# Patient Record
Sex: Female | Born: 1945
Health system: Southern US, Community
[De-identification: ages and names within clinical notes are randomized; demographics above are authoritative.]

## PROBLEM LIST (undated history)

## (undated) DIAGNOSIS — G629 Polyneuropathy, unspecified: Secondary | ICD-10-CM

## (undated) DIAGNOSIS — C801 Malignant (primary) neoplasm, unspecified: Secondary | ICD-10-CM

## (undated) DIAGNOSIS — G4730 Sleep apnea, unspecified: Secondary | ICD-10-CM

## (undated) DIAGNOSIS — J3089 Other allergic rhinitis: Secondary | ICD-10-CM

## (undated) DIAGNOSIS — K219 Gastro-esophageal reflux disease without esophagitis: Secondary | ICD-10-CM

## (undated) DIAGNOSIS — L603 Nail dystrophy: Secondary | ICD-10-CM

## (undated) DIAGNOSIS — G459 Transient cerebral ischemic attack, unspecified: Secondary | ICD-10-CM

## (undated) DIAGNOSIS — D6800 Von Willebrand disease, unspecified: Secondary | ICD-10-CM

## (undated) DIAGNOSIS — E119 Type 2 diabetes mellitus without complications: Secondary | ICD-10-CM

## (undated) DIAGNOSIS — E7800 Pure hypercholesterolemia, unspecified: Secondary | ICD-10-CM

## (undated) DIAGNOSIS — Z860100 Personal history of colon polyps, unspecified: Secondary | ICD-10-CM

## (undated) DIAGNOSIS — G2581 Restless legs syndrome: Secondary | ICD-10-CM

## (undated) DIAGNOSIS — E039 Hypothyroidism, unspecified: Secondary | ICD-10-CM

## (undated) DIAGNOSIS — R809 Proteinuria, unspecified: Secondary | ICD-10-CM

## (undated) DIAGNOSIS — M1990 Unspecified osteoarthritis, unspecified site: Secondary | ICD-10-CM

## (undated) DIAGNOSIS — E1142 Type 2 diabetes mellitus with diabetic polyneuropathy: Secondary | ICD-10-CM

## (undated) DIAGNOSIS — J449 Chronic obstructive pulmonary disease, unspecified: Secondary | ICD-10-CM

## (undated) DIAGNOSIS — Z8541 Personal history of malignant neoplasm of cervix uteri: Secondary | ICD-10-CM

## (undated) DIAGNOSIS — I10 Essential (primary) hypertension: Secondary | ICD-10-CM

## (undated) HISTORY — DX: Chronic obstructive pulmonary disease, unspecified: J44.9

## (undated) HISTORY — PX: OTHER SURGICAL HISTORY: SHX169

## (undated) HISTORY — DX: Essential (primary) hypertension: I10

## (undated) HISTORY — DX: Gastro-esophageal reflux disease without esophagitis: K21.9

## (undated) HISTORY — PX: APPENDECTOMY: SHX54

## (undated) HISTORY — DX: Personal history of malignant neoplasm of cervix uteri: Z85.41

## (undated) HISTORY — PX: CARDIAC CATHETERIZATION: SHX172

## (undated) HISTORY — PX: MUSCLE BIOPSY: SHX716

## (undated) HISTORY — DX: Unspecified osteoarthritis, unspecified site: M19.90

## (undated) HISTORY — DX: Type 2 diabetes mellitus without complications: E11.9

## (undated) HISTORY — PX: OOPHORECTOMY: SHX86

---

## 1967-01-11 DIAGNOSIS — C801 Malignant (primary) neoplasm, unspecified: Secondary | ICD-10-CM

## 1967-01-11 HISTORY — DX: Malignant (primary) neoplasm, unspecified: C80.1

## 1967-01-11 HISTORY — PX: ABDOMINAL HYSTERECTOMY: SHX81

## 1969-01-10 HISTORY — PX: ABDOMINAL HYSTERECTOMY: SHX81

## 1995-01-11 HISTORY — PX: BLADDER SURGERY: SHX569

## 2001-10-05 LAB — HM PAP SMEAR: HM Pap smear: NORMAL

## 2004-04-01 ENCOUNTER — Ambulatory Visit: Payer: Self-pay | Admitting: Family Medicine

## 2005-04-27 ENCOUNTER — Ambulatory Visit: Payer: Self-pay | Admitting: Family Medicine

## 2005-08-26 ENCOUNTER — Ambulatory Visit: Payer: Self-pay | Admitting: Gastroenterology

## 2005-08-26 DIAGNOSIS — Z860101 Personal history of adenomatous and serrated colon polyps: Secondary | ICD-10-CM | POA: Insufficient documentation

## 2005-08-26 DIAGNOSIS — Z86010 Personal history of colonic polyps: Secondary | ICD-10-CM | POA: Insufficient documentation

## 2006-05-04 ENCOUNTER — Ambulatory Visit: Payer: Self-pay | Admitting: Family Medicine

## 2007-05-08 ENCOUNTER — Ambulatory Visit: Payer: Self-pay | Admitting: Family Medicine

## 2007-12-13 ENCOUNTER — Ambulatory Visit: Payer: Self-pay | Admitting: Specialist

## 2008-01-11 HISTORY — PX: CARPAL TUNNEL RELEASE: SHX101

## 2008-01-11 HISTORY — PX: TENDON RELEASE: SHX230

## 2008-05-13 ENCOUNTER — Ambulatory Visit: Payer: Self-pay | Admitting: Family Medicine

## 2008-06-13 ENCOUNTER — Ambulatory Visit: Payer: Self-pay | Admitting: Family Medicine

## 2008-06-27 ENCOUNTER — Ambulatory Visit: Payer: Self-pay | Admitting: Orthopedic Surgery

## 2008-07-07 ENCOUNTER — Ambulatory Visit: Payer: Self-pay | Admitting: Orthopedic Surgery

## 2008-11-19 ENCOUNTER — Ambulatory Visit: Payer: Self-pay | Admitting: Podiatry

## 2008-11-20 ENCOUNTER — Ambulatory Visit: Payer: Self-pay | Admitting: Podiatry

## 2008-11-28 ENCOUNTER — Ambulatory Visit: Payer: Self-pay | Admitting: Podiatry

## 2008-12-17 ENCOUNTER — Ambulatory Visit: Payer: Self-pay | Admitting: Podiatry

## 2009-02-24 ENCOUNTER — Ambulatory Visit: Payer: Self-pay | Admitting: Podiatry

## 2009-10-21 ENCOUNTER — Ambulatory Visit: Payer: Self-pay | Admitting: Unknown Physician Specialty

## 2009-11-09 LAB — HM MAMMOGRAPHY: HM MAMMO: NORMAL

## 2010-09-24 ENCOUNTER — Emergency Department: Payer: Self-pay | Admitting: Internal Medicine

## 2010-10-08 LAB — HM COLONOSCOPY

## 2010-11-09 ENCOUNTER — Ambulatory Visit: Payer: Self-pay | Admitting: Family Medicine

## 2011-05-30 ENCOUNTER — Ambulatory Visit: Payer: Self-pay | Admitting: Gastroenterology

## 2011-08-16 ENCOUNTER — Ambulatory Visit: Payer: Self-pay | Admitting: Gastroenterology

## 2011-08-18 LAB — PATHOLOGY REPORT

## 2011-11-10 ENCOUNTER — Ambulatory Visit: Payer: Self-pay | Admitting: Family Medicine

## 2012-12-05 ENCOUNTER — Ambulatory Visit: Payer: Self-pay | Admitting: Family Medicine

## 2013-10-31 DIAGNOSIS — G4733 Obstructive sleep apnea (adult) (pediatric): Secondary | ICD-10-CM | POA: Insufficient documentation

## 2013-11-01 ENCOUNTER — Ambulatory Visit: Payer: Self-pay | Admitting: Family Medicine

## 2013-12-09 ENCOUNTER — Ambulatory Visit: Payer: Self-pay | Admitting: Family Medicine

## 2014-07-21 ENCOUNTER — Other Ambulatory Visit: Payer: Self-pay | Admitting: Family Medicine

## 2014-07-25 DIAGNOSIS — E1149 Type 2 diabetes mellitus with other diabetic neurological complication: Secondary | ICD-10-CM | POA: Insufficient documentation

## 2014-07-25 DIAGNOSIS — L603 Nail dystrophy: Secondary | ICD-10-CM | POA: Insufficient documentation

## 2014-07-25 DIAGNOSIS — D6800 Von Willebrand disease, unspecified: Secondary | ICD-10-CM | POA: Insufficient documentation

## 2014-07-25 DIAGNOSIS — E039 Hypothyroidism, unspecified: Secondary | ICD-10-CM | POA: Insufficient documentation

## 2014-07-25 DIAGNOSIS — G2581 Restless legs syndrome: Secondary | ICD-10-CM | POA: Insufficient documentation

## 2014-07-25 DIAGNOSIS — K219 Gastro-esophageal reflux disease without esophagitis: Secondary | ICD-10-CM | POA: Insufficient documentation

## 2014-07-25 DIAGNOSIS — R252 Cramp and spasm: Secondary | ICD-10-CM | POA: Insufficient documentation

## 2014-07-25 DIAGNOSIS — E7800 Pure hypercholesterolemia, unspecified: Secondary | ICD-10-CM | POA: Insufficient documentation

## 2014-07-25 DIAGNOSIS — K5730 Diverticulosis of large intestine without perforation or abscess without bleeding: Secondary | ICD-10-CM | POA: Insufficient documentation

## 2014-07-25 DIAGNOSIS — Z8541 Personal history of malignant neoplasm of cervix uteri: Secondary | ICD-10-CM | POA: Insufficient documentation

## 2014-07-25 DIAGNOSIS — R809 Proteinuria, unspecified: Secondary | ICD-10-CM | POA: Insufficient documentation

## 2014-07-25 DIAGNOSIS — Z9181 History of falling: Secondary | ICD-10-CM | POA: Insufficient documentation

## 2014-07-25 DIAGNOSIS — I10 Essential (primary) hypertension: Secondary | ICD-10-CM | POA: Insufficient documentation

## 2014-07-25 DIAGNOSIS — R609 Edema, unspecified: Secondary | ICD-10-CM | POA: Insufficient documentation

## 2014-07-25 DIAGNOSIS — J449 Chronic obstructive pulmonary disease, unspecified: Secondary | ICD-10-CM | POA: Insufficient documentation

## 2014-07-25 DIAGNOSIS — IMO0002 Reserved for concepts with insufficient information to code with codable children: Secondary | ICD-10-CM | POA: Insufficient documentation

## 2014-07-25 DIAGNOSIS — G459 Transient cerebral ischemic attack, unspecified: Secondary | ICD-10-CM | POA: Insufficient documentation

## 2014-07-29 ENCOUNTER — Ambulatory Visit
Admission: RE | Admit: 2014-07-29 | Discharge: 2014-07-29 | Disposition: A | Discharge status: Home / Self Care | Payer: PPO | Source: Ambulatory Visit | Attending: Family Medicine | Admitting: Family Medicine

## 2014-07-29 ENCOUNTER — Encounter: Payer: Self-pay | Admitting: Family Medicine

## 2014-07-29 ENCOUNTER — Ambulatory Visit (INDEPENDENT_AMBULATORY_CARE_PROVIDER_SITE_OTHER): Payer: PPO | Admitting: Family Medicine

## 2014-07-29 VITALS — BP 110/64 | HR 76 | Temp 98.4°F | Resp 16 | Ht 65.0 in | Wt 232.0 lb

## 2014-07-29 DIAGNOSIS — M79605 Pain in left leg: Secondary | ICD-10-CM | POA: Diagnosis not present

## 2014-07-29 DIAGNOSIS — J309 Allergic rhinitis, unspecified: Secondary | ICD-10-CM | POA: Diagnosis not present

## 2014-07-29 DIAGNOSIS — M159 Polyosteoarthritis, unspecified: Secondary | ICD-10-CM

## 2014-07-29 DIAGNOSIS — M150 Primary generalized (osteo)arthritis: Secondary | ICD-10-CM

## 2014-07-29 DIAGNOSIS — I10 Essential (primary) hypertension: Secondary | ICD-10-CM | POA: Diagnosis not present

## 2014-07-29 DIAGNOSIS — E119 Type 2 diabetes mellitus without complications: Secondary | ICD-10-CM | POA: Diagnosis not present

## 2014-07-29 LAB — POCT GLYCOSYLATED HEMOGLOBIN (HGB A1C)
Est. average glucose Bld gHb Est-mCnc: 146
HEMOGLOBIN A1C: 6.7

## 2014-07-29 MED ORDER — MONTELUKAST SODIUM 10 MG PO TABS: ORAL_TABLET | ORAL | Status: DC

## 2014-07-29 NOTE — Progress Notes (Signed)
Patient: Samantha Henry Female    DOB: 1945/06/05   69 y.o.   MRN: [id number] Visit Date: 07/29/2014  Today's Provider: Lelon Huh, MD   Chief Complaint  Patient presents with  . Follow-up  . Hypertension  . Diabetes  . Hypothyroidism   Subjective:    Hypertension This is a chronic problem. The current episode started more than 1 year ago. The problem is controlled. Associated symptoms include malaise/fatigue. Pertinent negatives include no chest pain, headaches, palpitations or shortness of breath. Risk factors for coronary artery disease include diabetes mellitus.  Diabetes She presents for her follow-up diabetic visit. She has type 2 diabetes mellitus. Pertinent negatives for hypoglycemia include no dizziness or headaches. Associated symptoms include fatigue. Pertinent negatives for diabetes include no chest pain. Risk factors for coronary artery disease include hypertension and diabetes mellitus.   Follow-up for Adult hypothyroidism from 03/24/2014; no changes were made.      Diabetes Mellitus Type II, Follow-up:   No results found for: HGBA1C Last seen for diabetes 4 months ago.  Management changes included none. She reports good compliance with treatment. She is not having side effects. NONE  Home blood sugar records: fasting range: 112  Current exercise: no regular exercise  ------------------------------------------------------------------------   Hypertension, follow-up:  BP Readings from Last 3 Encounters:  07/29/14 110/64  03/24/14 130/72    She was last seen for hypertension 4 months ago.  BP at that visit was 138/82. Management changes since that visit include restarted lisinopril .She reports good compliance with treatment. She is not having side effects. none  She is not exercising. She is adherent to low salt diet.   Outside blood pressures are 110/65. She is experiencing none.  Patient denies none.     -------------------------------------------------------------------- Leg Pain States that her left leg still hurts, ever since she had a fall in November. She states she went to hospital and had Xray the day of the fall and there was no fracture at that time. She is able to bear weight on leg, but it is painful.   Allergies  Allergen Reactions  . Aspirin   . Tetracycline    Previous Medications   ALBUTEROL (VENTOLIN HFA) 108 (90 BASE) MCG/ACT INHALER    Inhale 1-2 Inhalers into the lungs as needed.   ASPIRIN 81 MG TABLET    Take 1 tablet by mouth daily.   BUDESONIDE-FORMOTEROL (SYMBICORT) 160-4.5 MCG/ACT INHALER    Inhale 2 puffs into the lungs 2 (two) times daily.   CALCIUM-MAGNESIUM-VITAMIN D (CALCIUM 500 PO)    Take 1 tablet by mouth 2 (two) times daily.   CRESTOR 20 MG TABLET    TAKE ONE TABLET BY MOUTH ONCE DAILY   FUROSEMIDE (LASIX) 20 MG TABLET    Take 1 tablet by mouth as needed.   IBUPROFEN (ADVIL,MOTRIN) 200 MG TABLET    Take 1-2 tablets by mouth as needed.   LEVOTHYROXINE (SYNTHROID, LEVOTHROID) 150 MCG TABLET    Take 1 tablet by mouth daily.   LISINOPRIL (PRINIVIL,ZESTRIL) 10 MG TABLET    Take 1 tablet by mouth daily.   METFORMIN (GLUCOPHAGE) 500 MG TABLET    Take 2 tablets by mouth 2 (two) times daily.   MULTIPLE VITAMIN PO    Take 1 tablet by mouth daily.   OMEGA-3 FATTY ACIDS (FISH OIL BURP-LESS) 1000 MG CAPS    Take 2 tablets by mouth daily.   RABEPRAZOLE (ACIPHEX) 20 MG TABLET    Take 1  tablet by mouth daily.   TIOTROPIUM (SPIRIVA HANDIHALER) 18 MCG INHALATION CAPSULE    Place 1 Inhaler into inhaler and inhale daily.    Review of Systems  Constitutional: Positive for malaise/fatigue and fatigue. Negative for fever and chills.  Respiratory: Negative for cough, chest tightness and shortness of breath.   Cardiovascular: Positive for leg swelling. Negative for chest pain and palpitations.  Neurological: Negative for dizziness and headaches.    History  Substance Use  Topics  . Smoking status: Former Smoker -- 1.50 packs/day    Types: Cigarettes    Quit date: 01/11/2007  . Smokeless tobacco: Never Used  . Alcohol Use: No   Objective:   BP 110/64 mmHg  Pulse 76  Temp(Src) 98.4 F (36.9 C) (Oral)  Resp 16  Ht '5\' 5"'$  (1.651 m)  Wt 232 lb (105.235 kg)  BMI 38.61 kg/m2  SpO2 94%  Physical Exam   General Appearance:    Alert, cooperative, no distress  Eyes:    PERRL, conjunctiva/corneas clear, EOM's intact       Lungs:     Clear to auscultation bilaterally, respirations unlabored  Heart:    Regular rate and rhythm  Neurologic:   Awake, alert, oriented x 3. No apparent focal neurological           defect.    Diabetic Foot Exam - Simple   Simple Foot Form  Visual Inspection  No deformities, no ulcerations, no other skin breakdown bilaterally:  Yes  Sensation Testing  Intact to touch and monofilament testing bilaterally:  Yes  Pulse Check  Posterior Tibialis and Dorsalis pulse intact bilaterally:  Yes  Comments         Results for orders placed or performed in visit on 07/29/14  POCT glycosylated hemoglobin (Hb A1C)  Result Value Ref Range   Hemoglobin A1C 6.7    Est. average glucose Bld gHb Est-mCnc 146        Assessment & Plan:     1. Left leg pain  - DG Tibia/Fibula Left; Future - DG Ankle Complete Left; Future  2. Essential hypertension well controlled Continue current medications.    3. Type 2 diabetes mellitus without complication well controlled Continue current medications.   - POCT glycosylated hemoglobin (Hb A1C)  4. Allergic rhinitis, unspecified allergic rhinitis type States she has tried [facility] and not controlling congestion, drainage, and coug.  - montelukast (SINGULAIR) 10 MG tablet; One every day for allergies.  Dispense: 30 tablet; Refill: 3        Lelon Huh, MD  [facility]

## 2014-07-30 ENCOUNTER — Encounter: Payer: Self-pay | Admitting: *Deleted

## 2014-08-02 ENCOUNTER — Encounter: Payer: Self-pay | Admitting: Family Medicine

## 2014-08-02 DIAGNOSIS — M1990 Unspecified osteoarthritis, unspecified site: Secondary | ICD-10-CM | POA: Insufficient documentation

## 2014-08-18 ENCOUNTER — Telehealth: Payer: Self-pay | Admitting: Family Medicine

## 2014-08-18 DIAGNOSIS — M79606 Pain in leg, unspecified: Secondary | ICD-10-CM

## 2014-08-18 NOTE — Telephone Encounter (Signed)
Still having sinus problems.  Also still having foot pain.  Please call her back.  [phone].  Thank sTeri

## 2014-08-18 NOTE — Telephone Encounter (Signed)
Patient stated that she still has tickling in her throat which makes her cough constantly. Patient also said that her foot is no better. Patient has been useing wrap that was recommended, but it does not help. Patient stated that her foot is still discolored and very painful. Patient requested referral to orthopedics.

## 2014-08-18 NOTE — Telephone Encounter (Signed)
Sometimes lisinopril can cause tickle in throat and a cough. She can try stopping the lisinopril for a week to see if this helps, and let me know if it does so we can change to different BP medication  Needs referral to orthopedics for foot and ankle pain since it is still not better. Have sent order to Sarah to schedule. Thanks.

## 2014-08-18 NOTE — Telephone Encounter (Signed)
Patient notified

## 2014-09-23 ENCOUNTER — Telehealth: Payer: Self-pay | Admitting: Family Medicine

## 2014-09-23 DIAGNOSIS — I10 Essential (primary) hypertension: Secondary | ICD-10-CM

## 2014-09-23 NOTE — Telephone Encounter (Signed)
Pt has called back.  Thanks [employer]

## 2014-09-23 NOTE — Telephone Encounter (Signed)
Pt has question of whether she can start back on her blood pressure pills.  She said she was having some adverse reactions and you told her to go off the blood pressure pill but haven't prescribed anything else in its place.     Her call back is [phone]  Thanks [employer]

## 2014-09-24 MED ORDER — LOSARTAN POTASSIUM 50 MG PO TABS: 50.0000 mg | ORAL_TABLET | Freq: Every day | ORAL | Status: DC

## 2014-09-24 NOTE — Telephone Encounter (Signed)
Advised pt as directed below. Pt stated that her tickling on her throat had stopped when she stopped taking the Lisinopril and that she was under the impression that her B/P medication was going to be changed. She also stated that if something is to be prescribed to send it to the [facility].  Thanks,

## 2014-09-24 NOTE — Telephone Encounter (Signed)
Advised pt as directed below, pt stated that she will be leaving on Monday(09/29/2014) evening to [place] for a couple of months, "to take care of her son, he had suffered a car accident and he is paralyzed " she said that she can come in this Monday 09/29/2014 morning or in two months when she comes back.  Please advise.  Thanks,

## 2014-09-24 NOTE — Telephone Encounter (Signed)
She can come in when she returns from [place]

## 2014-09-24 NOTE — Telephone Encounter (Signed)
We had her stop lisinopril in August due to constant tickling in her throat. Need to know if this is better, if is gone then will change to different blood pressure medication.

## 2014-09-24 NOTE — Telephone Encounter (Signed)
Advised pt as directed below. Pt verbalized fully understanding and said that if she comes back home sooner than the 2 months she will call to make a f/u appointment.  Thanks,

## 2014-09-24 NOTE — Telephone Encounter (Signed)
OK. Need to start losartan 3monce a day. Have sent rx to [facility]. Need to schedule follow up in about 4 weeks to check on BP and blood sugar.

## 2014-10-15 ENCOUNTER — Other Ambulatory Visit: Payer: Self-pay | Admitting: Family Medicine

## 2014-10-15 DIAGNOSIS — Z1231 Encounter for screening mammogram for malignant neoplasm of breast: Secondary | ICD-10-CM

## 2014-12-11 ENCOUNTER — Ambulatory Visit: Payer: PPO

## 2014-12-19 ENCOUNTER — Other Ambulatory Visit: Payer: Self-pay | Admitting: Family Medicine

## 2014-12-19 LAB — HM DIABETES EYE EXAM

## 2014-12-22 ENCOUNTER — Other Ambulatory Visit: Payer: Self-pay | Admitting: *Deleted

## 2014-12-22 MED ORDER — LEVOTHYROXINE SODIUM 150 MCG PO TABS: 150.0000 ug | ORAL_TABLET | Freq: Every day | ORAL | Status: DC

## 2014-12-25 ENCOUNTER — Ambulatory Visit
Admission: RE | Admit: 2014-12-25 | Discharge: 2014-12-25 | Disposition: A | Discharge status: Home / Self Care | Payer: PPO | Source: Ambulatory Visit | Attending: Family Medicine | Admitting: Family Medicine

## 2014-12-25 DIAGNOSIS — Z1231 Encounter for screening mammogram for malignant neoplasm of breast: Secondary | ICD-10-CM | POA: Diagnosis not present

## 2014-12-25 HISTORY — DX: Malignant (primary) neoplasm, unspecified: C80.1

## 2014-12-26 ENCOUNTER — Encounter: Payer: Self-pay | Admitting: Family Medicine

## 2014-12-26 ENCOUNTER — Ambulatory Visit (INDEPENDENT_AMBULATORY_CARE_PROVIDER_SITE_OTHER): Payer: PPO | Admitting: Family Medicine

## 2014-12-26 VITALS — BP 120/60 | HR 80 | Temp 97.7°F | Resp 16 | Wt 228.0 lb

## 2014-12-26 DIAGNOSIS — G629 Polyneuropathy, unspecified: Secondary | ICD-10-CM | POA: Diagnosis not present

## 2014-12-26 DIAGNOSIS — E119 Type 2 diabetes mellitus without complications: Secondary | ICD-10-CM | POA: Diagnosis not present

## 2014-12-26 DIAGNOSIS — R059 Cough, unspecified: Secondary | ICD-10-CM

## 2014-12-26 DIAGNOSIS — R05 Cough: Secondary | ICD-10-CM

## 2014-12-26 DIAGNOSIS — J449 Chronic obstructive pulmonary disease, unspecified: Secondary | ICD-10-CM

## 2014-12-26 LAB — POCT UA - MICROALBUMIN: Microalbumin Ur, POC: 20 mg/L

## 2014-12-26 LAB — POCT GLYCOSYLATED HEMOGLOBIN (HGB A1C)
Est. average glucose Bld gHb Est-mCnc: 151
Hemoglobin A1C: 6.9

## 2014-12-26 MED ORDER — AZITHROMYCIN 250 MG PO TABS: ORAL_TABLET | ORAL | Status: AC

## 2014-12-26 NOTE — Progress Notes (Signed)
Patient: Samantha Henry Female    DOB: 24-May-1945   70 y.o.   MRN: [id number] Visit Date: 12/26/2014  Today's Provider: Lelon Huh, MD   Chief Complaint  Patient presents with  . Diabetes    follow up  . Hypertension    follow up   Subjective:    HPI  Diabetes Mellitus Type II, Follow-up:   Lab Results  Component Value Date   HGBA1C 6.7 07/29/2014    Last seen for diabetes 5 months ago.  Management since then includes no changes. She reports good compliance with treatment. She is not having side effects.  Current symptoms include none and have been stable. Home blood sugar records: fasting range: 120-160  Episodes of hypoglycemia? no   Current Insulin Regimen: none Most Recent Eye Exam: 12/2014 Weight trend: stable Prior visit with dietician: no Current diet: in general, an "unhealthy" diet Current exercise: none  Pertinent Labs: No results found for: CHOL, TRIG, HDL, LDLCALC, CREATININE  Wt Readings from Last 3 Encounters:  07/29/14 232 lb (105.235 kg)  03/24/14 225 lb (102.059 kg)    ------------------------------------------------------------------------   Leg Pain Has long history of persistent lower leg pain at rest. She states that Dr. Raul Del advised her to request a referral to Dr. Manuella Ghazi for evaluation of neruopathy.    Hypertension, follow-up:  BP Readings from Last 3 Encounters:  07/29/14 110/64  03/24/14 130/72    She was last seen for hypertension 5 months ago.  BP at that visit was 110/64. Management since that visit includes discontinuing Lisinopril due to it causing a tickle in patient throat and starting Losartan '5mg'$  daily. Since changing medications patient reports the tickle in her throat has resolved.  She reports good compliance with treatment. She is not having side effects.  She is not exercising. She is adherent to low salt diet.   Outside blood pressures are 119-417 systolic over 40-81 diastolic. She is  experiencing fatigue and lower extremity edema.  Patient denies fatigue and lower extremity edema.   Cardiovascular risk factors include diabetes mellitus, hypertension, obesity (BMI >= 30 kg/m2) and sedentary lifestyle.  Use of agents associated with hypertension: NSAIDS.     Weight trend: stable Wt Readings from Last 3 Encounters:  07/29/14 232 lb (105.235 kg)  03/24/14 225 lb (102.059 kg)    Current diet: in general, an "unhealthy" diet  ------------------------------------------------------------------------  Cough States has had a persistent and worsening cough over the last week. She is using her inhalers regularly including Ventolin which she is using every day. She is coughing up clear and yellow mucous. Slightly short of breath. No fever or chills. She started taking Claritin due to suspected allergies.      Allergies  Allergen Reactions  . Aspirin   . Tetracycline    Previous Medications   ALBUTEROL (VENTOLIN HFA) 108 (90 BASE) MCG/ACT INHALER    Inhale 1-2 Inhalers into the lungs as needed.   ASPIRIN 81 MG TABLET    Take 1 tablet by mouth daily.   BUDESONIDE-FORMOTEROL (SYMBICORT) 160-4.5 MCG/ACT INHALER    Inhale 2 puffs into the lungs 2 (two) times daily.   CALCIUM-MAGNESIUM-VITAMIN D (CALCIUM 500 PO)    Take 1 tablet by mouth 2 (two) times daily.   CRESTOR 20 MG TABLET    TAKE ONE TABLET BY MOUTH ONCE DAILY   FUROSEMIDE (LASIX) 20 MG TABLET    Take 1 tablet by mouth as needed.   IBUPROFEN (ADVIL,MOTRIN) 200 MG  TABLET    Take 1-2 tablets by mouth as needed.   LEVOTHYROXINE (SYNTHROID, LEVOTHROID) 150 MCG TABLET    Take 1 tablet (150 mcg total) by mouth daily.   LOSARTAN (COZAAR) 50 MG TABLET    TAKE ONE TABLET BY MOUTH ONCE DAILY   METFORMIN (GLUCOPHAGE) 500 MG TABLET    Take 2 tablets by mouth 2 (two) times daily.   MONTELUKAST (SINGULAIR) 10 MG TABLET    One every day for allergies.   MULTIPLE VITAMIN PO    Take 1 tablet by mouth daily.   OMEGA-3 FATTY ACIDS  (FISH OIL BURP-LESS) 1000 MG CAPS    Take 2 tablets by mouth daily.   RABEPRAZOLE (ACIPHEX) 20 MG TABLET    Take 1 tablet by mouth daily.   TIOTROPIUM (SPIRIVA HANDIHALER) 18 MCG INHALATION CAPSULE    Place 1 Inhaler into inhaler and inhale daily.    Review of Systems  Constitutional: Positive for fatigue. Negative for fever, chills and appetite change.  HENT: Positive for congestion and sore throat.   Respiratory: Negative for chest tightness and shortness of breath.   Cardiovascular: Positive for leg swelling. Negative for chest pain and palpitations.  Gastrointestinal: Negative for nausea, vomiting and abdominal pain.  Neurological: Negative for dizziness and weakness.    Social History  Substance Use Topics  . Smoking status: Former Smoker -- 1.50 packs/day    Types: Cigarettes    Quit date: 01/11/2007  . Smokeless tobacco: Never Used  . Alcohol Use: No   Objective:   BP 120/60 mmHg  Pulse 80  Temp(Src) 97.7 F (36.5 C) (Oral)  Resp 16  Wt 228 lb (103.42 kg)  SpO2 94%  Physical Exam  General Appearance:    Alert, cooperative, no distress  HENT:   bilateral TM normal without fluid or infection, neck without nodes, pharynx erythematous without exudate, sinuses nontender and nasal mucosa pale and congested  Eyes:    PERRL, conjunctiva/corneas clear, EOM's intact       Lungs:     Occasional expiratory wheeze, no rales respirations unlabored  Heart:    Regular rate and rhythm  Neurologic:   Awake, alert, oriented x 3. No apparent focal neurological           defect.       Results for orders placed or performed in visit on 12/26/14  POCT HgB A1C  Result Value Ref Range   Hemoglobin A1C 6.9    Est. average glucose Bld gHb Est-mCnc 151   POCT UA - Microalbumin  Result Value Ref Range   Microalbumin Ur, POC 20 mg/L   Creatinine, POC n/a mg/dL   Albumin/Creatinine Ratio, Urine, POC n/a        Assessment & Plan:     1. Controlled type 2 diabetes mellitus without  complication, without long-term current use of insulin (HCC) Continue current medications.   - POCT HgB A1C - POCT UA - Microalbumin  2. Cough In setting of COPD. She has still not had flu shot and does not want to get it until she is better. Advised to return as soon as cough starts to improve for flu vaccine - azithromycin (ZITHROMAX) 250 MG tablet; 2 by mouth today, then 1 daily for 4 days  Dispense: 6 tablet; Refill: 0  3. Neuropathy ([place]) Possible cause of chronic bilateral leg pain, refer to neurology per her request.  - Ambulatory referral to Neurology  4. Chronic obstructive pulmonary disease, unspecified COPD type ([place]) Continue current inhalers.  Lelon Huh, MD  [facility]

## 2014-12-29 ENCOUNTER — Other Ambulatory Visit: Payer: Self-pay | Admitting: Family Medicine

## 2014-12-29 DIAGNOSIS — R921 Mammographic calcification found on diagnostic imaging of breast: Secondary | ICD-10-CM

## 2014-12-31 ENCOUNTER — Encounter: Payer: Self-pay | Admitting: *Deleted

## 2015-01-07 ENCOUNTER — Ambulatory Visit
Admission: RE | Admit: 2015-01-07 | Discharge: 2015-01-07 | Disposition: A | Discharge status: Home / Self Care | Payer: PPO | Source: Ambulatory Visit | Attending: Family Medicine | Admitting: Family Medicine

## 2015-01-07 ENCOUNTER — Other Ambulatory Visit: Payer: Self-pay | Admitting: Family Medicine

## 2015-01-07 ENCOUNTER — Ambulatory Visit: Payer: PPO

## 2015-01-07 DIAGNOSIS — R921 Mammographic calcification found on diagnostic imaging of breast: Secondary | ICD-10-CM

## 2015-01-07 DIAGNOSIS — R920 Mammographic microcalcification found on diagnostic imaging of breast: Secondary | ICD-10-CM | POA: Diagnosis not present

## 2015-01-13 ENCOUNTER — Other Ambulatory Visit: Payer: Self-pay | Admitting: Family Medicine

## 2015-01-13 DIAGNOSIS — R921 Mammographic calcification found on diagnostic imaging of breast: Secondary | ICD-10-CM

## 2015-01-20 ENCOUNTER — Other Ambulatory Visit: Payer: Self-pay | Admitting: Family Medicine

## 2015-01-21 ENCOUNTER — Ambulatory Visit
Admission: RE | Admit: 2015-01-21 | Discharge: 2015-01-21 | Disposition: A | Discharge status: Home / Self Care | Payer: PPO | Source: Ambulatory Visit | Attending: Family Medicine | Admitting: Family Medicine

## 2015-01-21 DIAGNOSIS — R921 Mammographic calcification found on diagnostic imaging of breast: Secondary | ICD-10-CM | POA: Diagnosis not present

## 2015-01-21 DIAGNOSIS — N6489 Other specified disorders of breast: Secondary | ICD-10-CM | POA: Diagnosis not present

## 2015-01-21 DIAGNOSIS — R920 Mammographic microcalcification found on diagnostic imaging of breast: Secondary | ICD-10-CM | POA: Diagnosis not present

## 2015-01-21 HISTORY — PX: BREAST BIOPSY: SHX20

## 2015-01-22 LAB — SURGICAL PATHOLOGY

## 2015-02-17 DIAGNOSIS — G4733 Obstructive sleep apnea (adult) (pediatric): Secondary | ICD-10-CM | POA: Diagnosis not present

## 2015-02-17 DIAGNOSIS — E559 Vitamin D deficiency, unspecified: Secondary | ICD-10-CM | POA: Diagnosis not present

## 2015-02-17 DIAGNOSIS — E538 Deficiency of other specified B group vitamins: Secondary | ICD-10-CM | POA: Diagnosis not present

## 2015-02-17 DIAGNOSIS — Z0000 Encounter for general adult medical examination without abnormal findings: Secondary | ICD-10-CM | POA: Diagnosis not present

## 2015-02-17 DIAGNOSIS — I739 Peripheral vascular disease, unspecified: Secondary | ICD-10-CM | POA: Diagnosis not present

## 2015-02-17 DIAGNOSIS — E1142 Type 2 diabetes mellitus with diabetic polyneuropathy: Secondary | ICD-10-CM | POA: Diagnosis not present

## 2015-02-17 DIAGNOSIS — Z6841 Body Mass Index (BMI) 40.0 and over, adult: Secondary | ICD-10-CM | POA: Diagnosis not present

## 2015-02-20 ENCOUNTER — Other Ambulatory Visit: Payer: Self-pay | Admitting: Family Medicine

## 2015-02-25 DIAGNOSIS — G629 Polyneuropathy, unspecified: Secondary | ICD-10-CM | POA: Diagnosis not present

## 2015-02-25 DIAGNOSIS — Z0000 Encounter for general adult medical examination without abnormal findings: Secondary | ICD-10-CM | POA: Diagnosis not present

## 2015-02-26 ENCOUNTER — Encounter: Payer: Self-pay | Admitting: Family Medicine

## 2015-02-26 DIAGNOSIS — G629 Polyneuropathy, unspecified: Secondary | ICD-10-CM | POA: Insufficient documentation

## 2015-02-27 DIAGNOSIS — I739 Peripheral vascular disease, unspecified: Secondary | ICD-10-CM | POA: Diagnosis not present

## 2015-03-16 ENCOUNTER — Encounter: Payer: Self-pay | Admitting: Family Medicine

## 2015-03-19 ENCOUNTER — Other Ambulatory Visit: Payer: Self-pay | Admitting: Family Medicine

## 2015-03-27 ENCOUNTER — Ambulatory Visit: Payer: PPO | Admitting: Family Medicine

## 2015-03-27 ENCOUNTER — Encounter: Payer: Self-pay | Admitting: Family Medicine

## 2015-03-27 ENCOUNTER — Ambulatory Visit (INDEPENDENT_AMBULATORY_CARE_PROVIDER_SITE_OTHER): Payer: PPO | Admitting: Family Medicine

## 2015-03-27 VITALS — BP 122/60 | HR 71 | Temp 97.7°F | Resp 16 | Wt 238.0 lb

## 2015-03-27 DIAGNOSIS — E039 Hypothyroidism, unspecified: Secondary | ICD-10-CM

## 2015-03-27 DIAGNOSIS — I10 Essential (primary) hypertension: Secondary | ICD-10-CM

## 2015-03-27 DIAGNOSIS — E7800 Pure hypercholesterolemia, unspecified: Secondary | ICD-10-CM

## 2015-03-27 DIAGNOSIS — E1149 Type 2 diabetes mellitus with other diabetic neurological complication: Secondary | ICD-10-CM | POA: Diagnosis not present

## 2015-03-27 DIAGNOSIS — G629 Polyneuropathy, unspecified: Secondary | ICD-10-CM

## 2015-03-27 DIAGNOSIS — K219 Gastro-esophageal reflux disease without esophagitis: Secondary | ICD-10-CM | POA: Diagnosis not present

## 2015-03-27 LAB — POCT GLYCOSYLATED HEMOGLOBIN (HGB A1C)
Est. average glucose Bld gHb Est-mCnc: 154
Hemoglobin A1C: 7

## 2015-03-27 NOTE — Patient Instructions (Signed)
You can take OTC Pepcid AC at bedtime if you have indigestion more than 3 nights a week.    Food Choices for Gastroesophageal Reflux Disease, Adult When you have gastroesophageal reflux disease (GERD), the foods you eat and your eating habits are very important. Choosing the right foods can help ease the discomfort of GERD. WHAT GENERAL GUIDELINES DO I NEED TO FOLLOW?  Choose fruits, vegetables, whole grains, low-fat dairy products, and low-fat meat, fish, and poultry.  Limit fats such as oils, salad dressings, butter, nuts, and avocado.  Keep a food diary to identify foods that cause symptoms.  Avoid foods that cause reflux. These may be different for different people.  Eat frequent small meals instead of three large meals each day.  Eat your meals slowly, in a relaxed setting.  Limit fried foods.  Cook foods using methods other than frying.  Avoid drinking alcohol.  Avoid drinking large amounts of liquids with your meals.  Avoid bending over or lying down until 2-3 hours after eating. WHAT FOODS ARE NOT RECOMMENDED? The following are some foods and drinks that may worsen your symptoms: Vegetables Tomatoes. Tomato juice. Tomato and spaghetti sauce. Chili peppers. Onion and garlic. Horseradish. Fruits Oranges, grapefruit, and lemon (fruit and juice). Meats High-fat meats, fish, and poultry. This includes hot dogs, ribs, ham, sausage, salami, and bacon. Dairy Whole milk and chocolate milk. Sour cream. Cream. Butter. Ice cream. Cream cheese.  Beverages Coffee and tea, with or without caffeine. Carbonated beverages or energy drinks. Condiments Hot sauce. Barbecue sauce.  Sweets/Desserts Chocolate and cocoa. Donuts. Peppermint and spearmint. Fats and Oils High-fat foods, including [place] fries and potato chips. Other Vinegar. Strong spices, such as black pepper, white pepper, red pepper, cayenne, curry powder, cloves, ginger, and chili powder. The items listed above may  not be a complete list of foods and beverages to avoid. Contact your dietitian for more information.   This information is not intended to replace advice given to you by your health care provider. Make sure you discuss any questions you have with your health care provider.   Document Released: 12/27/2004 Document Revised: 01/17/2014 Document Reviewed: 10/31/2012 Elsevier Interactive Patient Education [employer].

## 2015-03-27 NOTE — Progress Notes (Signed)
Patient: Samantha Henry Female    DOB: February 15, 1945   70 y.o.   MRN: [id number] Visit Date: 03/27/2015  Today's Provider: Lelon Huh, MD   Chief Complaint  Patient presents with  . Hypertension    follow up  . COPD    follow up  . Diabetes    follow up   Subjective:    HPI  Hypertension, follow-up:  BP Readings from Last 3 Encounters:  12/26/14 120/60  07/29/14 110/64  03/24/14 130/72    She was last seen for hypertension 8 months ago.  BP at that visit was 110/64. Management since that visit includes no changes. She reports good compliance with treatment. She is not having side effects.  She is not exercising. She is not adherent to low salt diet.   Outside blood pressures are 503-546 systolic over . She is experiencing heaviness on her chest. Patient denies chest pain, claudication, dyspnea, exertional chest pressure/discomfort, fatigue, irregular heart beat, lower extremity edema, near-syncope, orthopnea, palpitations, paroxysmal nocturnal dyspnea, syncope and tachypnea.   Cardiovascular risk factors include advanced age (older than 55 for men, 51 for women), diabetes mellitus, hypertension and sedentary lifestyle.  Use of agents associated with hypertension: NSAIDS.     Weight trend: increasing steadily Wt Readings from Last 3 Encounters:  12/26/14 228 lb (103.42 kg)  07/29/14 232 lb (105.235 kg)  03/24/14 225 lb (102.059 kg)    Current diet: in general, an "unhealthy" diet  ------------------------------------------------------------------------   Diabetes Mellitus Type II, Follow-up:   Lab Results  Component Value Date   HGBA1C 6.9 12/26/2014   HGBA1C 6.7 07/29/2014    Last seen for diabetes 3 months ago.  Management since then includes no changes. She reports good compliance with treatment. She is not having side effects.  Current symptoms include none and have been stable. Home blood sugar records: fasting range: 120-150  Episodes  of hypoglycemia? no   Current Insulin Regimen: none Most Recent Eye Exam: 12/2014 Weight trend: increasing steadily Prior visit with dietician: no Current diet: in general, an "unhealthy" diet Current exercise: none  Pertinent Labs: No results found for: CHOL, TRIG, HDL, LDLCALC, CREATININE  Wt Readings from Last 3 Encounters:  12/26/14 228 lb (103.42 kg)  07/29/14 232 lb (105.235 kg)  03/24/14 225 lb (102.059 kg)    ------------------------------------------------------------------------  Follow up COPD:  Last office visit was 3 months ago. No changes were made. Patient was to continue current inhalers.  Patient reports good compliance with treatment and good symptom control.  GERD States she is taking Acephex every day, but still waking up 2-3 times a week with heartburn which is relieved by tums. Has no heartburn of chest pains during the day.     Allergies  Allergen Reactions  . Aspirin   . Tetracycline    Previous Medications   ALBUTEROL (VENTOLIN HFA) 108 (90 BASE) MCG/ACT INHALER    Inhale 1-2 Inhalers into the lungs as needed.   ASPIRIN 81 MG TABLET    Take 1 tablet by mouth daily.   BUDESONIDE-FORMOTEROL (SYMBICORT) 160-4.5 MCG/ACT INHALER    Inhale 2 puffs into the lungs 2 (two) times daily.   CALCIUM-MAGNESIUM-VITAMIN D (CALCIUM 500 PO)    Take 1 tablet by mouth 2 (two) times daily.   DULOXETINE (CYMBALTA) 20 MG CAPSULE    Take 1 capsule by mouth daily.   FUROSEMIDE (LASIX) 20 MG TABLET    Take 1 tablet by mouth as needed.  IBUPROFEN (ADVIL,MOTRIN) 200 MG TABLET    Take 1-2 tablets by mouth as needed.   LEVOTHYROXINE (SYNTHROID, LEVOTHROID) 150 MCG TABLET    Take 1 tablet (150 mcg total) by mouth daily.   LOSARTAN (COZAAR) 50 MG TABLET    TAKE ONE TABLET BY MOUTH ONCE DAILY   METFORMIN (GLUCOPHAGE) 500 MG TABLET    Take 2 tablets by mouth 2 (two) times daily.   METFORMIN (GLUCOPHAGE-XR) 500 MG 24 HR TABLET    TAKE TWO TABLETS BY MOUTH IN THE MORNING AND TWO IN  THE EVENING   MONTELUKAST (SINGULAIR) 10 MG TABLET    One every day for allergies.   MULTIPLE VITAMIN PO    Take 1 tablet by mouth daily.   OMEGA-3 FATTY ACIDS (FISH OIL BURP-LESS) 1000 MG CAPS    Take 2 tablets by mouth daily.   ONE TOUCH ULTRA TEST TEST STRIP    USE TO CHECK BLOOD SUGAR DAILY   RABEPRAZOLE (ACIPHEX) 20 MG TABLET    TAKE ONE TABLET BY MOUTH ONCE DAILY   ROSUVASTATIN (CRESTOR) 20 MG TABLET    TAKE ONE TABLET BY MOUTH ONCE DAILY   TIOTROPIUM (SPIRIVA HANDIHALER) 18 MCG INHALATION CAPSULE    Place 1 Inhaler into inhaler and inhale daily.    Review of Systems  Constitutional: Negative for fever, chills, appetite change and fatigue.  Respiratory: Positive for chest tightness (at night as per HPI). Negative for shortness of breath.        Feels a heaviness on her chest  Cardiovascular: Negative for chest pain and palpitations.  Gastrointestinal: Negative for nausea, vomiting and abdominal pain.  Endocrine: Negative for cold intolerance, heat intolerance, polydipsia, polyphagia and polyuria.  Neurological: Negative for dizziness and weakness.    Social History  Substance Use Topics  . Smoking status: Former Smoker -- 1.50 packs/day    Types: Cigarettes    Quit date: 01/11/2007  . Smokeless tobacco: Never Used  . Alcohol Use: No   Objective:   BP 122/60 mmHg  Pulse 71  Temp(Src) 97.7 F (36.5 C) (Oral)  Resp 16  Wt 238 lb (107.956 kg)  SpO2 93%  Physical Exam  General Appearance:    Alert, cooperative, no distress, obese  Eyes:    PERRL, conjunctiva/corneas clear, EOM's intact       Lungs:     Clear to auscultation bilaterally, respirations unlabored  Heart:    Regular rate and rhythm  Neurologic:   Awake, alert, oriented x 3. No apparent focal neurological           defect.        Results for orders placed or performed in visit on 03/27/15  POCT HgB A1C  Result Value Ref Range   Hemoglobin A1C 7.0    Est. average glucose Bld gHb Est-mCnc 154          Assessment & Plan:     1. Controlled type 2 diabetes mellitus with other neurologic complication, without long-term current use of insulin (HCC) Continue current medications.   - POCT HgB A1C  2. Hypercholesteremia She is tolerating rosuvastatin well with no adverse effects.   - Lipid panel  3. Essential hypertension Well controlled.  Continue current medications.   - EKG 12-Lead - Renal function panel  4. Hypothyroidism, unspecified hypothyroidism type  - TSH  5. Gastroesophageal reflux disease without esophagitis Still having nocturnal symptoms on current PPI. Discussed dietary precautions, may add OTC Pepcid at bedtime if symptoms occur more than 3 nights  a week.   6. Polyneuropathy ([place]) Continue regular follow up neurology.        Lelon Huh, MD  [facility]

## 2015-03-28 LAB — LIPID PANEL
Chol/HDL Ratio: 2.3 ratio units (ref 0.0–4.4)
Cholesterol, Total: 139 mg/dL (ref 100–199)
HDL: 60 mg/dL (ref >39)
LDL Calculated: 66 mg/dL (ref 0–99)
Triglycerides: 67 mg/dL (ref 0–149)
VLDL Cholesterol Cal: 13 mg/dL (ref 5–40)

## 2015-03-28 LAB — RENAL FUNCTION PANEL
Albumin: 4.5 g/dL (ref 3.6–4.8)
BUN/Creatinine Ratio: 25 (ref 11–26)
BUN: 13 mg/dL (ref 8–27)
CALCIUM: 9.4 mg/dL (ref 8.7–10.3)
CHLORIDE: 99 mmol/L (ref 96–106)
CO2: 26 mmol/L (ref 18–29)
CREATININE: 0.53 mg/dL — AB (ref 0.57–1.00)
GFR calc Af Amer: 113 mL/min/{1.73_m2} (ref >59)
GFR calc non Af Amer: 98 mL/min/{1.73_m2} (ref >59)
Glucose: 145 mg/dL — ABNORMAL HIGH (ref 65–99)
PHOSPHORUS: 3.2 mg/dL (ref 2.5–4.5)
Potassium: 4.2 mmol/L (ref 3.5–5.2)
SODIUM: 140 mmol/L (ref 134–144)

## 2015-03-28 LAB — TSH: TSH: 0.139 u[IU]/mL — ABNORMAL LOW (ref 0.450–4.500)

## 2015-04-21 DIAGNOSIS — Z6841 Body Mass Index (BMI) 40.0 and over, adult: Secondary | ICD-10-CM | POA: Diagnosis not present

## 2015-04-21 DIAGNOSIS — J449 Chronic obstructive pulmonary disease, unspecified: Secondary | ICD-10-CM | POA: Diagnosis not present

## 2015-04-21 DIAGNOSIS — G4733 Obstructive sleep apnea (adult) (pediatric): Secondary | ICD-10-CM | POA: Diagnosis not present

## 2015-04-21 DIAGNOSIS — R0609 Other forms of dyspnea: Secondary | ICD-10-CM | POA: Diagnosis not present

## 2015-05-18 DIAGNOSIS — G629 Polyneuropathy, unspecified: Secondary | ICD-10-CM | POA: Diagnosis not present

## 2015-07-24 ENCOUNTER — Ambulatory Visit (INDEPENDENT_AMBULATORY_CARE_PROVIDER_SITE_OTHER): Payer: PPO | Admitting: Family Medicine

## 2015-07-24 ENCOUNTER — Encounter: Payer: Self-pay | Admitting: Family Medicine

## 2015-07-24 VITALS — BP 124/78 | HR 75 | Temp 98.4°F | Resp 16 | Wt 228.0 lb

## 2015-07-24 DIAGNOSIS — I10 Essential (primary) hypertension: Secondary | ICD-10-CM | POA: Diagnosis not present

## 2015-07-24 DIAGNOSIS — E119 Type 2 diabetes mellitus without complications: Secondary | ICD-10-CM | POA: Diagnosis not present

## 2015-07-24 LAB — POCT GLYCOSYLATED HEMOGLOBIN (HGB A1C)
ESTIMATED AVERAGE GLUCOSE: 157
HEMOGLOBIN A1C: 7.1

## 2015-07-24 MED ORDER — FUROSEMIDE 20 MG PO TABS: 20.0000 mg | ORAL_TABLET | ORAL | Status: DC | PRN

## 2015-07-24 NOTE — Progress Notes (Signed)
Patient: Samantha Henry Female    DOB: 1945/03/10   70 y.o.   MRN: [id number] Visit Date: 07/24/2015  Today's Provider: Lelon Huh, MD   Chief Complaint  Patient presents with  . Hypertension    follow up  . Diabetes    follow up  . Hypothyroidism    follow up   Subjective:    HPI  Hypertension, follow-up:  BP Readings from Last 3 Encounters:  03/27/15 122/60  12/26/14 120/60  07/29/14 110/64    She was last seen for hypertension 4 months ago.  BP at that visit was 122/60. Management since that visit includes no changes. She reports good compliance with treatment. She is not having side effects.  She is not exercising. She is adherent to low salt diet.   Outside blood pressures are 341-962 systolic reading over 22-97 diastolic reading. She is experiencing chest pressure/discomfort, fatigue and lower extremity edema.  Patient denies claudication, dyspnea, irregular heart beat, near-syncope, orthopnea, palpitations, paroxysmal nocturnal dyspnea, syncope and tachypnea.   Cardiovascular risk factors include advanced age (older than 53 for men, 28 for women), diabetes mellitus, hypertension and sedentary lifestyle.  Use of agents associated with hypertension: NSAIDS.     Weight trend: decreasing steadily. 10 pound weight loss since the last office visit 4 months ago. Wt Readings from Last 3 Encounters:  03/27/15 238 lb (107.956 kg)  12/26/14 228 lb (103.42 kg)  07/29/14 232 lb (105.235 kg)    Current diet: well balanced  ------------------------------------------------------------------------   Diabetes Mellitus Type II, Follow-up:   Lab Results  Component Value Date   HGBA1C 7.0 03/27/2015   HGBA1C 6.9 12/26/2014   HGBA1C 6.7 07/29/2014    Last seen for diabetes 4 months ago.  Management since then includes no changes. She reports good compliance with treatment. She is not having side effects.  Current symptoms include none and have been  stable. Home blood sugar records: fasting range: 121-150  Episodes of hypoglycemia? no   Current Insulin Regimen: none Most Recent Eye Exam: 12/2014 Weight trend: decreasing steadily Prior visit with dietician: no Current diet: well balanced Current exercise: none  Pertinent Labs:    Component Value Date/Time   CHOL 139 03/27/2015 1155   TRIG 67 03/27/2015 1155   HDL 60 03/27/2015 1155   LDLCALC 66 03/27/2015 1155   CREATININE 0.53* 03/27/2015 1155    Wt Readings from Last 3 Encounters:  03/27/15 238 lb (107.956 kg)  12/26/14 228 lb (103.42 kg)  07/29/14 232 lb (105.235 kg)    ------------------------------------------------------------------------  Follow up Hypothyroidism: Patient was last seen 4 months ago and no changes were made. Patient reports good compliance with treatment and  good tolerance.    Allergies  Allergen Reactions  . Aspirin   . Tetracycline    Current Meds  Medication Sig  . albuterol (VENTOLIN HFA) 108 (90 BASE) MCG/ACT inhaler Inhale 1-2 Inhalers into the lungs as needed.  Marland Kitchen aspirin 81 MG tablet Take 1 tablet by mouth daily.  . budesonide-formoterol (SYMBICORT) 160-4.5 MCG/ACT inhaler Inhale 2 puffs into the lungs 2 (two) times daily.  . Calcium-Magnesium-Vitamin D (CALCIUM 500 PO) Take 1 tablet by mouth 2 (two) times daily.  . furosemide (LASIX) 20 MG tablet Take 1 tablet by mouth as needed.  Marland Kitchen ibuprofen (ADVIL,MOTRIN) 200 MG tablet Take 1-2 tablets by mouth as needed.  Marland Kitchen levothyroxine (SYNTHROID, LEVOTHROID) 150 MCG tablet Take 1 tablet (150 mcg total) by mouth daily.  Marland Kitchen losartan (  COZAAR) 50 MG tablet TAKE ONE TABLET BY MOUTH ONCE DAILY  . metFORMIN (GLUCOPHAGE) 500 MG tablet Take 2 tablets by mouth 2 (two) times daily.  . metFORMIN (GLUCOPHAGE-XR) 500 MG 24 hr tablet TAKE TWO TABLETS BY MOUTH IN THE MORNING AND TWO IN THE EVENING  . montelukast (SINGULAIR) 10 MG tablet One every day for allergies.  Marland Kitchen MULTIPLE VITAMIN PO Take 1 tablet by  mouth daily.  . Omega-3 Fatty Acids (FISH OIL BURP-LESS) 1000 MG CAPS Take 2 tablets by mouth daily.  . ONE TOUCH ULTRA TEST test strip USE TO CHECK BLOOD SUGAR DAILY  . RABEprazole (ACIPHEX) 20 MG tablet TAKE ONE TABLET BY MOUTH ONCE DAILY  . rosuvastatin (CRESTOR) 20 MG tablet TAKE ONE TABLET BY MOUTH ONCE DAILY  . tiotropium (SPIRIVA HANDIHALER) 18 MCG inhalation capsule Place 1 Inhaler into inhaler and inhale daily.    Review of Systems  Constitutional: Positive for fatigue. Negative for fever, chills and appetite change.  Respiratory: Negative for chest tightness (pressure in the center of her chest x 2 months) and shortness of breath.   Cardiovascular: Positive for leg swelling. Negative for chest pain and palpitations.  Gastrointestinal: Negative for nausea, vomiting and abdominal pain.  Neurological: Negative for dizziness and weakness.    Social History  Substance Use Topics  . Smoking status: Former Smoker -- 1.50 packs/day    Types: Cigarettes    Quit date: 01/11/2007  . Smokeless tobacco: Never Used  . Alcohol Use: No   Objective:   BP 124/78 mmHg  Pulse 75  Temp(Src) 98.4 F (36.9 C) (Oral)  Resp 16  Wt 228 lb (103.42 kg)  SpO2 94%  Physical Exam  General Appearance:    Alert, cooperative, no distress, obese  Eyes:    PERRL, conjunctiva/corneas clear, EOM's intact       Lungs:     Clear to auscultation bilaterally, respirations unlabored  Heart:    Regular rate and rhythm  Neurologic:   Awake, alert, oriented x 3. No apparent focal neurological           defect.       Results for orders placed or performed in visit on 07/24/15  POCT HgB A1C  Result Value Ref Range   Hemoglobin A1C 7.1    Est. average glucose Bld gHb Est-mCnc 157        Assessment & Plan:     1. Controlled type 2 diabetes mellitus without complication, without long-term current use of insulin ([place]) Well controlled.  Continue current medications.  Follow up 4-5 months  - POCT HgB  A1C  2. Essential hypertension Well controlled.  Continue current medications.          Lelon Huh, MD  [facility]

## 2015-07-25 ENCOUNTER — Other Ambulatory Visit: Payer: Self-pay | Admitting: Family Medicine

## 2015-07-31 DIAGNOSIS — S8000XA Contusion of unspecified knee, initial encounter: Secondary | ICD-10-CM | POA: Diagnosis not present

## 2015-07-31 DIAGNOSIS — S4992XA Unspecified injury of left shoulder and upper arm, initial encounter: Secondary | ICD-10-CM | POA: Diagnosis not present

## 2015-07-31 DIAGNOSIS — S46912A Strain of unspecified muscle, fascia and tendon at shoulder and upper arm level, left arm, initial encounter: Secondary | ICD-10-CM | POA: Diagnosis not present

## 2015-07-31 DIAGNOSIS — M25561 Pain in right knee: Secondary | ICD-10-CM | POA: Diagnosis not present

## 2015-08-02 ENCOUNTER — Other Ambulatory Visit: Payer: Self-pay | Admitting: Family Medicine

## 2015-08-24 DIAGNOSIS — G4733 Obstructive sleep apnea (adult) (pediatric): Secondary | ICD-10-CM | POA: Diagnosis not present

## 2015-08-24 DIAGNOSIS — Z6841 Body Mass Index (BMI) 40.0 and over, adult: Secondary | ICD-10-CM | POA: Diagnosis not present

## 2015-08-24 DIAGNOSIS — J449 Chronic obstructive pulmonary disease, unspecified: Secondary | ICD-10-CM | POA: Diagnosis not present

## 2015-08-25 ENCOUNTER — Other Ambulatory Visit: Payer: Self-pay | Admitting: Family Medicine

## 2015-09-30 ENCOUNTER — Ambulatory Visit: Payer: PPO | Attending: Specialist

## 2015-09-30 DIAGNOSIS — G4733 Obstructive sleep apnea (adult) (pediatric): Secondary | ICD-10-CM | POA: Diagnosis not present

## 2015-10-15 ENCOUNTER — Other Ambulatory Visit: Payer: Self-pay | Admitting: Family Medicine

## 2015-10-15 DIAGNOSIS — Z1231 Encounter for screening mammogram for malignant neoplasm of breast: Secondary | ICD-10-CM

## 2015-11-18 DIAGNOSIS — R296 Repeated falls: Secondary | ICD-10-CM | POA: Diagnosis not present

## 2015-11-18 DIAGNOSIS — Z6836 Body mass index (BMI) 36.0-36.9, adult: Secondary | ICD-10-CM | POA: Diagnosis not present

## 2015-11-18 DIAGNOSIS — E6609 Other obesity due to excess calories: Secondary | ICD-10-CM | POA: Diagnosis not present

## 2015-11-18 DIAGNOSIS — E1142 Type 2 diabetes mellitus with diabetic polyneuropathy: Secondary | ICD-10-CM | POA: Diagnosis not present

## 2015-11-30 DIAGNOSIS — R0609 Other forms of dyspnea: Secondary | ICD-10-CM | POA: Diagnosis not present

## 2015-11-30 DIAGNOSIS — J449 Chronic obstructive pulmonary disease, unspecified: Secondary | ICD-10-CM | POA: Diagnosis not present

## 2015-11-30 DIAGNOSIS — G479 Sleep disorder, unspecified: Secondary | ICD-10-CM | POA: Diagnosis not present

## 2015-11-30 DIAGNOSIS — G4734 Idiopathic sleep related nonobstructive alveolar hypoventilation: Secondary | ICD-10-CM | POA: Diagnosis not present

## 2015-12-08 ENCOUNTER — Telehealth: Payer: Self-pay | Admitting: Family Medicine

## 2015-12-08 NOTE — Telephone Encounter (Signed)
Called Pt to schedule AWV with [facility] - knb °

## 2015-12-08 NOTE — Telephone Encounter (Signed)
Pt returned call and scheduled AWV for 12/31/15 with [facility]. Thanks TNP

## 2015-12-11 DIAGNOSIS — Z86010 Personal history of colonic polyps: Secondary | ICD-10-CM | POA: Diagnosis not present

## 2015-12-11 DIAGNOSIS — Z8371 Family history of colonic polyps: Secondary | ICD-10-CM | POA: Diagnosis not present

## 2015-12-21 ENCOUNTER — Other Ambulatory Visit: Payer: Self-pay | Admitting: Family Medicine

## 2015-12-28 ENCOUNTER — Ambulatory Visit
Admission: RE | Admit: 2015-12-28 | Discharge: 2015-12-28 | Disposition: A | Discharge status: Home / Self Care | Payer: PPO | Source: Ambulatory Visit | Attending: Family Medicine | Admitting: Family Medicine

## 2015-12-28 DIAGNOSIS — Z1231 Encounter for screening mammogram for malignant neoplasm of breast: Secondary | ICD-10-CM | POA: Diagnosis not present

## 2015-12-31 ENCOUNTER — Ambulatory Visit (INDEPENDENT_AMBULATORY_CARE_PROVIDER_SITE_OTHER): Payer: PPO

## 2015-12-31 VITALS — BP 124/72 | HR 76 | Temp 98.4°F | Ht 65.0 in | Wt 227.0 lb

## 2015-12-31 DIAGNOSIS — Z0000 Encounter for general adult medical examination without abnormal findings: Secondary | ICD-10-CM

## 2015-12-31 NOTE — Progress Notes (Signed)
Subjective:   Samantha Henry is a 70 y.o. female who presents for an Initial Medicare Annual Wellness Visit.  Review of Systems    N/A  Cardiac Risk Factors include: advanced age (>27mn, >>72women);diabetes mellitus;dyslipidemia;hypertension;obesity (BMI >30kg/m2)     Objective:    Today's Vitals   12/31/15 1456  BP: 124/72  Pulse: 76  Temp: 98.4 F (36.9 C)  TempSrc: Oral  Weight: 227 lb (103 kg)  Height: '5\' 5"'$  (1.651 m)  PainSc: 0-No pain   Body mass index is 37.77 kg/m.   Current Medications (verified) Outpatient Encounter Prescriptions as of 12/31/2015  Medication Sig  . albuterol (VENTOLIN HFA) 108 (90 BASE) MCG/ACT inhaler Inhale 1-2 Inhalers into the lungs as needed.  .Marland Kitchenaspirin 81 MG tablet Take 1 tablet by mouth daily.  . budesonide-formoterol (SYMBICORT) 160-4.5 MCG/ACT inhaler Inhale 2 puffs into the lungs 2 (two) times daily.  . Calcium-Magnesium-Vitamin D (CALCIUM 500 PO) Take 1 tablet by mouth 2 (two) times daily.  . cholecalciferol (VITAMIN D) 400 units TABS tablet Take 400 Units by mouth daily.  . DULoxetine HCl 40 MG CPEP Take 40 mg by mouth daily.  .Marland Kitchenibuprofen (ADVIL,MOTRIN) 200 MG tablet Take 1-2 tablets by mouth as needed.  .Marland Kitchenlevothyroxine (SYNTHROID, LEVOTHROID) 150 MCG tablet TAKE ONE TABLET BY MOUTH ONCE DAILY  . loratadine (ALLERGY RELIEF) 10 MG tablet Take 10 mg by mouth daily.  .Marland Kitchenlosartan (COZAAR) 50 MG tablet TAKE ONE TABLET BY MOUTH ONCE DAILY  . metFORMIN (GLUCOPHAGE-XR) 500 MG 24 hr tablet TAKE TWO TABLETS BY MOUTH IN THE MORNING AND TWO IN THE EVENING  . MULTIPLE VITAMIN PO Take 1 tablet by mouth daily.  . nortriptyline (PAMELOR) 10 MG capsule Take 10 mg by mouth 2 (two) times daily.  . Omega-3 Fatty Acids (FISH OIL BURP-LESS) 1000 MG CAPS Take 2 tablets by mouth daily.   . ONE TOUCH ULTRA TEST test strip USE TO CHECK BLOOD SUGAR DAILY  . RABEprazole (ACIPHEX) 20 MG tablet TAKE ONE TABLET BY MOUTH ONCE DAILY  . rosuvastatin (CRESTOR) 20  MG tablet TAKE ONE TABLET BY MOUTH ONCE DAILY  . tiotropium (SPIRIVA HANDIHALER) 18 MCG inhalation capsule Place 1 Inhaler into inhaler and inhale daily.  . vitamin B-12 (CYANOCOBALAMIN) 500 MCG tablet Take 500 mcg by mouth daily.  . furosemide (LASIX) 20 MG tablet Take 1 tablet (20 mg total) by mouth as needed. (Patient not taking: Reported on 12/31/2015)  . montelukast (SINGULAIR) 10 MG tablet One every day for allergies. (Patient not taking: Reported on 12/31/2015)   No facility-administered encounter medications on file as of 12/31/2015.     Allergies (verified) Aspirin; Lyrica [pregabalin]; and Tetracycline   History: Past Medical History:  Diagnosis Date  . [place]    CERVICAL CA  . History of cervical cancer    Past Surgical History:  Procedure Laterality Date  . ABDOMINAL HYSTERECTOMY  1971   due to cervical cancer   . BLADDER SURGERY  1997  . BREAST BIOPSY Left 01/21/2015   NEG  . CARPAL TUNNEL RELEASE  2010  . TENDON RELEASE  2010   right foot, Dr. FVickki Muff   Family History  Problem Relation Age of Onset  . Alzheimer's disease Mother   . Cancer Father   . Diabetes Sister   . Diabetes Brother   . Hodgkin's lymphoma Sister   . Breast cancer Maternal Aunt    Social History   Occupational History  . Not on file.  Social History Main Topics  . Smoking status: Former Smoker    Packs/day: 1.50    Types: Cigarettes    Quit date: 01/11/2007  . Smokeless tobacco: Never Used  . Alcohol use No  . Drug use: No  . Sexual activity: Not on file    Tobacco Counseling Counseling given: Not Answered   Activities of Daily Living In your present state of health, do you have any difficulty performing the following activities: 12/31/2015  Hearing? N  Vision? N  Difficulty concentrating or making decisions? Y  Walking or climbing stairs? Y  Dressing or bathing? N  Doing errands, shopping? N  Preparing Food and eating ? N  Using the Toilet? N  In the past six  months, have you accidently leaked urine? N  Do you have problems with loss of bowel control? N  Managing your Medications? N  Managing your Finances? N  Housekeeping or managing your Housekeeping? N  Some recent data might be hidden    Immunizations and Health Maintenance Immunization History  Administered Date(s) Administered  . Influenza, High Dose Seasonal PF 10/19/2015  . Influenza-Unspecified 02/02/2015  . Pneumococcal Conjugate-13 04/12/2013  . Pneumococcal Polysaccharide-23 11/24/2011  . Tdap 06/06/2008  . Zoster 09/11/2010   There are no preventive care reminders to display for this patient.  Patient Care Team: Birdie Sons, MD as PCP - General (Family Medicine) Erby Pian, MD as Consulting Physician (Pulmonary Disease) Lollie Sails, MD as Consulting Physician (Gastroenterology) Vladimir Crofts, MD (Neurology) Vladimir Crofts, MD as Consulting Physician (Neurology) Rosebud Poles, MD as Referring Physician (Otolaryngology)  Indicate any recent Medical Services you may have received from other than [facility] providers in the past year (date may be approximate).     Assessment:   This is a routine wellness examination for [place].   Hearing/Vision screen Vision Screening Comments: Pt sees Dr Ellin Mayhew for vision checks yearly.  Dietary issues and exercise activities discussed: Current Exercise Habits: The patient does not participate in regular exercise at present, Exercise limited by: orthopedic condition(s)  Goals    . Increase water intake          Starting 12/31/15, I will continue to drink 6-8 glasses of water a day.      Depression Screen PHQ 2/9 Scores 12/31/2015 12/26/2014  PHQ - 2 Score 0 0    Fall Risk Fall Risk  12/31/2015 12/26/2014  Falls in the past year? Yes Yes  Number falls in past yr: 2 or more 1  Injury with Fall? No No  Follow up Falls prevention discussed -    Cognitive Function:     6CIT Screen 12/31/2015  What Year?  0 points  What month? 0 points  What time? 0 points  Count back from 20 0 points  Months in reverse 2 points  Repeat phrase 4 points  Total Score 6    Screening Tests Health Maintenance  Topic Date Due  . FOOT EXAM  01/11/2016 (Originally 11/20/1955)  . Hepatitis C Screening  01/11/2016 (Originally 08/18/1945)  . OPHTHALMOLOGY EXAM  01/19/2016 (Originally 12/19/2015)  . HEMOGLOBIN A1C  01/24/2016  . MAMMOGRAM  12/27/2017  . TETANUS/TDAP  06/07/2018  . COLONOSCOPY  10/07/2020  . INFLUENZA VACCINE  Completed  . DEXA SCAN  Completed  . ZOSTAVAX  Completed  . PNA vac Low Risk Adult  Completed      Plan:  I have personally reviewed and addressed the Medicare Annual Wellness questionnaire and have noted  the following in the patient's chart:  A. Medical and social history B. Use of alcohol, tobacco or illicit drugs  C. Current medications and supplements D. Functional ability and status E.  Nutritional status F.  Physical activity G. Advance directives H. List of other physicians I.  Hospitalizations, surgeries, and ER visits in previous 12 months J.  [place] such as hearing and vision if needed, cognitive and depression L. Referrals and appointments - none  In addition, I have reviewed and discussed with patient certain preventive protocols, quality metrics, and best practice recommendations. A written personalized care plan for preventive services as well as general preventive health recommendations were provided to patient.  See attached scanned questionnaire for additional information.   Signed,  Fabio Neighbors, LPN Nurse Health Advisor   MD Recommendations: Pt to schedule follow up with Dr Caryn Section. Follow up on eye exam (01/19/16), hepatitis C screening (denied today) and diabetic foot exam.   I have reviewed the health advisor's note, was available for consultation, and agree with documentation and plan  Lelon Huh, MD

## 2015-12-31 NOTE — Patient Instructions (Signed)
Samantha Henry , Thank you for taking time to come for your Medicare Wellness Visit. I appreciate your ongoing commitment to your health goals. Please review the following plan we discussed and let me know if I can assist you in the future.   These are the goals we discussed: Goals    . Increase water intake          Starting 12/31/15, I will continue to drink 6-8 glasses of water a day.       This is a list of the screening recommended for you and due dates:  Health Maintenance  Topic Date Due  . Complete foot exam   01/11/2016*  .  Hepatitis C: One time screening is recommended by Center for Disease Control  (CDC) for  adults born from [birth year] through 1964.   01/11/2016*  . Eye exam for diabetics  01/19/2016*  . Hemoglobin A1C  01/24/2016  . Mammogram  12/27/2017  . Tetanus Vaccine  06/07/2018  . Colon Cancer Screening  10/07/2020  . Flu Shot  Completed  . DEXA scan (bone density measurement)  Completed  . Shingles Vaccine  Completed  . Pneumonia vaccines  Completed  *Topic was postponed. The date shown is not the original due date.   Preventive Care for Adults  A healthy lifestyle and preventive care can promote health and wellness. Preventive health guidelines for adults include the following key practices.  . A routine yearly physical is a good way to check with your health care provider about your health and preventive screening. It is a chance to share any concerns and updates on your health and to receive a thorough exam.  . Visit your dentist for a routine exam and preventive care every 6 months. Brush your teeth twice a day and floss once a day. Good oral hygiene prevents tooth decay and gum disease.  . The frequency of eye exams is based on your age, health, family medical history, use  of contact lenses, and other factors. Follow your health care provider's ecommendations for frequency of eye exams.  . Eat a healthy diet. Foods like vegetables, fruits, whole grains,  low-fat dairy products, and lean protein foods contain the nutrients you need without too many calories. Decrease your intake of foods high in solid fats, added sugars, and salt. Eat the right amount of calories for you. Get information about a proper diet from your health care provider, if necessary.  . Regular physical exercise is one of the most important things you can do for your health. Most adults should get at least 150 minutes of moderate-intensity exercise (any activity that increases your heart rate and causes you to sweat) each week. In addition, most adults need muscle-strengthening exercises on 2 or more days a week.  Silver Sneakers may be a benefit available to you. To determine eligibility, you may visit the website: www.silversneakers.com or contact program at (409)521-8760 Mon-Fri between 8AM-8PM.   . Maintain a healthy weight. The body mass index (BMI) is a screening tool to identify possible weight problems. It provides an estimate of body fat based on height and weight. Your health care provider can find your BMI and can help you achieve or maintain a healthy weight.   For adults 20 years and older: ? A BMI below 18.5 is considered underweight. ? A BMI of 18.5 to 24.9 is normal. ? A BMI of 25 to 29.9 is considered overweight. ? A BMI of 30 and above is considered obese.   Marland Kitchen  Maintain normal blood lipids and cholesterol levels by exercising and minimizing your intake of saturated fat. Eat a balanced diet with plenty of fruit and vegetables. Blood tests for lipids and cholesterol should begin at age 43 and be repeated every 5 years. If your lipid or cholesterol levels are high, you are over 50, or you are at high risk for heart disease, you may need your cholesterol levels checked more frequently. Ongoing high lipid and cholesterol levels should be treated with medicines if diet and exercise are not working.  . If you smoke, find out from your health care provider how to quit. If  you do not use tobacco, please do not start.  . If you choose to drink alcohol, please do not consume more than 2 drinks per day. One drink is considered to be 12 ounces (355 mL) of beer, 5 ounces (148 mL) of wine, or 1.5 ounces (44 mL) of liquor.  . If you are 34-28 years old, ask your health care provider if you should take aspirin to prevent strokes.  . Use sunscreen. Apply sunscreen liberally and repeatedly throughout the day. You should seek shade when your shadow is shorter than you. Protect yourself by wearing long sleeves, pants, a wide-brimmed hat, and sunglasses year round, whenever you are outdoors.  . Once a month, do a whole body skin exam, using a mirror to look at the skin on your back. Tell your health care provider of new moles, moles that have irregular borders, moles that are larger than a pencil eraser, or moles that have changed in shape or color.

## 2016-01-19 DIAGNOSIS — E119 Type 2 diabetes mellitus without complications: Secondary | ICD-10-CM | POA: Diagnosis not present

## 2016-01-19 DIAGNOSIS — H2513 Age-related nuclear cataract, bilateral: Secondary | ICD-10-CM | POA: Diagnosis not present

## 2016-01-19 LAB — HM DIABETES EYE EXAM

## 2016-01-21 ENCOUNTER — Ambulatory Visit (INDEPENDENT_AMBULATORY_CARE_PROVIDER_SITE_OTHER): Payer: PPO | Admitting: Family Medicine

## 2016-01-21 ENCOUNTER — Encounter: Payer: Self-pay | Admitting: Family Medicine

## 2016-01-21 VITALS — BP 150/80 | HR 82 | Temp 97.9°F | Resp 16 | Wt 229.0 lb

## 2016-01-21 DIAGNOSIS — E119 Type 2 diabetes mellitus without complications: Secondary | ICD-10-CM | POA: Diagnosis not present

## 2016-01-21 DIAGNOSIS — I10 Essential (primary) hypertension: Secondary | ICD-10-CM | POA: Diagnosis not present

## 2016-01-21 DIAGNOSIS — E039 Hypothyroidism, unspecified: Secondary | ICD-10-CM | POA: Diagnosis not present

## 2016-01-21 DIAGNOSIS — N390 Urinary tract infection, site not specified: Secondary | ICD-10-CM | POA: Diagnosis not present

## 2016-01-21 DIAGNOSIS — R8290 Unspecified abnormal findings in urine: Secondary | ICD-10-CM

## 2016-01-21 LAB — POCT UA - MICROALBUMIN: MICROALBUMIN (UR) POC: 20 mg/L

## 2016-01-21 LAB — POCT URINALYSIS DIPSTICK
Blood, UA: NEGATIVE
GLUCOSE UA: NEGATIVE
Ketones, UA: NEGATIVE
NITRITE UA: NEGATIVE
Protein, UA: NEGATIVE
Spec Grav, UA: 1.015
UROBILINOGEN UA: 0.2
pH, UA: 7

## 2016-01-21 LAB — POCT GLYCOSYLATED HEMOGLOBIN (HGB A1C)
Est. average glucose Bld gHb Est-mCnc: 166
Hemoglobin A1C: 7.4

## 2016-01-21 NOTE — Progress Notes (Signed)
Patient: Samantha Henry Female    DOB: [birth year]   71 y.o.   MRN: [id number] Visit Date: 01/21/2016  Today's Provider: Lelon Huh, MD   Chief Complaint  Patient presents with  . Hypertension    follow up  . Diabetes    follow up  . Hypothyroidism    follow up   Subjective:    HPI  Diabetes Mellitus Type II, Follow-up:   Lab Results  Component Value Date   HGBA1C 7.1 07/24/2015   HGBA1C 7.0 03/27/2015   HGBA1C 6.9 12/26/2014    Last seen for diabetes 6 months ago.  Management since then includes no changes. She reports good compliance with treatment. She is not having side effects.  Current symptoms include none and have been stable. Home blood sugar records: fasting range: 145-195  Episodes of hypoglycemia? no   Current Insulin Regimen: none Most Recent Eye Exam: 11/18/2016 Weight trend: fluctuating a bit Prior visit with dietician: no Current diet: in general, an "unhealthy" diet Current exercise: none  Pertinent Labs:    Component Value Date/Time   CHOL 139 03/27/2015 1155   TRIG 67 03/27/2015 1155   HDL 60 03/27/2015 1155   LDLCALC 66 03/27/2015 1155   CREATININE 0.53 (L) 03/27/2015 1155    Wt Readings from Last 3 Encounters:  12/31/15 227 lb (103 kg)  07/24/15 228 lb (103.4 kg)  03/27/15 238 lb (108 kg)    ------------------------------------------------------------------------  Hypertension, follow-up:  BP Readings from Last 3 Encounters:  12/31/15 124/72  07/24/15 124/78  03/27/15 122/60    She was last seen for hypertension 6 months ago.  BP at that visit was 124/78. Management since that visit includes no changes. She reports good compliance with treatment. She is not having side effects.  She is exercising. She is adherent to low salt diet.   Outside blood pressures are 528-413 (systolic) over 24-40 (diastolic). She is experiencing none.  Patient denies fatigue.   Cardiovascular risk factors include advanced age  (older than 50 for men, 42 for women) and diabetes mellitus.  Use of agents associated with hypertension: NSAIDS.     Weight trend: fluctuating a bit Wt Readings from Last 3 Encounters:  12/31/15 227 lb (103 kg)  07/24/15 228 lb (103.4 kg)  03/27/15 238 lb (108 kg)    Current diet: in general, an "unhealthy" diet  ------------------------------------------------------------------------ Follow up Hypothyroidism:   Patient was last seen 10 months ago and no changes were made. Patient reports good compliance with treatment and good tolerance.  Lab Results  Component Value Date   TSH 0.139 (L) 03/27/2015   Complains that urine is dark and foul smelling the last couple of week. Slight burning on urination, no increased frequency.     Allergies  Allergen Reactions  . Aspirin   . Lyrica [Pregabalin] Other (See Comments)    Dizziness  . Tetracycline      Current Outpatient Prescriptions:  .  albuterol (VENTOLIN HFA) 108 (90 BASE) MCG/ACT inhaler, Inhale 1-2 Inhalers into the lungs as needed., Disp: , Rfl:  .  aspirin 81 MG tablet, Take 1 tablet by mouth daily., Disp: , Rfl:  .  budesonide-formoterol (SYMBICORT) 160-4.5 MCG/ACT inhaler, Inhale 2 puffs into the lungs 2 (two) times daily., Disp: , Rfl:  .  Calcium-Magnesium-Vitamin D (CALCIUM 500 PO), Take 1 tablet by mouth 2 (two) times daily., Disp: , Rfl:  .  cholecalciferol (VITAMIN D) 400 units TABS tablet, Take 400 Units by  mouth daily., Disp: , Rfl:  .  DULoxetine HCl 40 MG CPEP, Take 40 mg by mouth daily., Disp: , Rfl:  .  furosemide (LASIX) 20 MG tablet, Take 1 tablet (20 mg total) by mouth as needed., Disp: 30 tablet, Rfl: 5 .  ibuprofen (ADVIL,MOTRIN) 200 MG tablet, Take 1-2 tablets by mouth as needed., Disp: , Rfl:  .  levothyroxine (SYNTHROID, LEVOTHROID) 150 MCG tablet, TAKE ONE TABLET BY MOUTH ONCE DAILY, Disp: 90 tablet, Rfl: 4 .  loratadine (ALLERGY RELIEF) 10 MG tablet, Take 10 mg by mouth daily., Disp: , Rfl:  .   losartan (COZAAR) 50 MG tablet, TAKE ONE TABLET BY MOUTH ONCE DAILY, Disp: 30 tablet, Rfl: 6 .  metFORMIN (GLUCOPHAGE-XR) 500 MG 24 hr tablet, TAKE TWO TABLETS BY MOUTH IN THE MORNING AND TWO IN THE EVENING, Disp: 360 tablet, Rfl: 4 .  montelukast (SINGULAIR) 10 MG tablet, One every day for allergies., Disp: 30 tablet, Rfl: 3 .  MULTIPLE VITAMIN PO, Take 1 tablet by mouth daily., Disp: , Rfl:  .  nortriptyline (PAMELOR) 10 MG capsule, Take 10 mg by mouth 2 (two) times daily., Disp: , Rfl:  .  Omega-3 Fatty Acids (FISH OIL BURP-LESS) 1000 MG CAPS, Take 2 tablets by mouth daily. , Disp: , Rfl:  .  ONE TOUCH ULTRA TEST test strip, USE TO CHECK BLOOD SUGAR DAILY, Disp: 100 each, Rfl: 4 .  RABEprazole (ACIPHEX) 20 MG tablet, TAKE ONE TABLET BY MOUTH ONCE DAILY, Disp: 30 tablet, Rfl: 12 .  rosuvastatin (CRESTOR) 20 MG tablet, TAKE ONE TABLET BY MOUTH ONCE DAILY, Disp: 90 tablet, Rfl: 4 .  tiotropium (SPIRIVA HANDIHALER) 18 MCG inhalation capsule, Place 1 Inhaler into inhaler and inhale daily., Disp: , Rfl:  .  vitamin B-12 (CYANOCOBALAMIN) 500 MCG tablet, Take 500 mcg by mouth daily., Disp: , Rfl:   Review of Systems  Constitutional: Negative for appetite change, chills, fatigue and fever.  Respiratory: Negative for chest tightness and shortness of breath.   Cardiovascular: Negative for chest pain and palpitations.  Gastrointestinal: Negative for abdominal pain, nausea and vomiting.  Neurological: Negative for dizziness and weakness.    Social History  Substance Use Topics  . Smoking status: Former Smoker    Packs/day: 1.50    Types: Cigarettes    Quit date: 01/11/2007  . Smokeless tobacco: Never Used  . Alcohol use No   Objective:   BP (!) 150/80 (BP Location: Right Arm, Patient Position: Sitting, Cuff Size: Large)   Pulse 82   Temp 97.9 F (36.6 C) (Oral)   Resp 16   Wt 229 lb (103.9 kg)   SpO2 95% Comment: room air  BMI 38.11 kg/m   Physical Exam  General Appearance:    Alert,  cooperative, no distress, obese  Eyes:    PERRL, conjunctiva/corneas clear, EOM's intact       Lungs:     Clear to auscultation bilaterally, respirations unlabored  Heart:    Regular rate and rhythm  Neurologic:   Awake, alert, oriented x 3. No apparent focal neurological           defect.         Results for orders placed or performed in visit on 01/21/16  POCT HgB A1C  Result Value Ref Range   Hemoglobin A1C 7.4    Est. average glucose Bld gHb Est-mCnc 166   POCT UA - Microalbumin  Result Value Ref Range   Microalbumin Ur, POC 20 mg/L   Creatinine,  POC n/a mg/dL   Albumin/Creatinine Ratio, Urine, POC n/a   POCT Urinalysis Dipstick  Result Value Ref Range   Color, UA amber    Clarity, UA clear    Glucose, UA negative    Bilirubin, UA small    Ketones, UA negative    Spec Grav, UA 1.015    Blood, UA negative    pH, UA 7.0    Protein, UA negative    Urobilinogen, UA 0.2    Nitrite, UA negative    Leukocytes, UA moderate (2+) (A) Negative       Assessment & Plan:     1. Type 2 diabetes mellitus without complication, without long-term current use of insulin ([place]) Pretty well controlled.  - POCT HgB A1C - POCT UA - Microalbumin - Lipid panel  2. Abnormal urine odor  - POCT Urinalysis Dipstick  3. Essential hypertension Well controlled.  Continue current medications.   - Renal function panel - TSH  4. Hypothyroidism, unspecified type  - TSH  5. Urinary tract infection without hematuria, site unspecified  - Urine Culture       Lelon Huh, MD  [facility]

## 2016-01-22 ENCOUNTER — Telehealth: Payer: Self-pay | Admitting: *Deleted

## 2016-01-22 LAB — RENAL FUNCTION PANEL
ALBUMIN: 4.5 g/dL (ref 3.6–4.8)
BUN/Creatinine Ratio: 22 (ref 12–28)
BUN: 13 mg/dL (ref 8–27)
CO2: 25 mmol/L (ref 18–29)
Calcium: 9.6 mg/dL (ref 8.7–10.3)
Chloride: 97 mmol/L (ref 96–106)
Creatinine, Ser: 0.58 mg/dL (ref 0.57–1.00)
GFR calc Af Amer: 109 mL/min/{1.73_m2} (ref >59)
GFR, EST NON AFRICAN AMERICAN: 94 mL/min/{1.73_m2} (ref >59)
Glucose: 136 mg/dL — ABNORMAL HIGH (ref 65–99)
Phosphorus: 3.3 mg/dL (ref 2.5–4.5)
Potassium: 4.7 mmol/L (ref 3.5–5.2)
Sodium: 139 mmol/L (ref 134–144)

## 2016-01-22 LAB — TSH: TSH: 0.043 u[IU]/mL — ABNORMAL LOW (ref 0.450–4.500)

## 2016-01-22 LAB — LIPID PANEL
CHOLESTEROL TOTAL: 151 mg/dL (ref 100–199)
Chol/HDL Ratio: 2.4 ratio units (ref 0.0–4.4)
HDL: 63 mg/dL (ref >39)
LDL Calculated: 70 mg/dL (ref 0–99)
Triglycerides: 89 mg/dL (ref 0–149)
VLDL CHOLESTEROL CAL: 18 mg/dL (ref 5–40)

## 2016-01-22 MED ORDER — LEVOTHYROXINE SODIUM 125 MCG PO TABS: 125.0000 ug | ORAL_TABLET | Freq: Every day | ORAL | 2 refills | Status: DC

## 2016-01-22 NOTE — Telephone Encounter (Signed)
Patient was notified of results. Expressed understanding. Rx sent to pharmacy. 

## 2016-01-22 NOTE — Telephone Encounter (Signed)
-----   Message from Birdie Sons, MD sent at 01/22/2016 12:19 PM EST ----- Cholesterol and kidney functions are good. Is hyPERthyroid. Need to reduce levothyroxine to 142mg daily, #90, rf x 2. Recheck TSH in about 2 1/2 months.

## 2016-01-23 LAB — URINE CULTURE

## 2016-01-29 ENCOUNTER — Other Ambulatory Visit: Payer: Self-pay | Admitting: Family Medicine

## 2016-02-11 ENCOUNTER — Encounter: Payer: Self-pay | Admitting: *Deleted

## 2016-02-12 ENCOUNTER — Encounter: Payer: Self-pay | Admitting: Family Medicine

## 2016-02-18 ENCOUNTER — Other Ambulatory Visit: Payer: Self-pay | Admitting: Family Medicine

## 2016-02-24 ENCOUNTER — Encounter: Payer: Self-pay | Admitting: *Deleted

## 2016-02-25 ENCOUNTER — Ambulatory Visit: Payer: PPO | Admitting: Anesthesiology

## 2016-02-25 ENCOUNTER — Encounter
Admission: RE | Disposition: A | Discharge status: Home / Self Care | Payer: Self-pay | Source: Ambulatory Visit | Attending: Gastroenterology

## 2016-02-25 ENCOUNTER — Ambulatory Visit
Admission: RE | Admit: 2016-02-25 | Discharge: 2016-02-25 | Disposition: A | Discharge status: Home / Self Care | Payer: PPO | Source: Ambulatory Visit | Attending: Gastroenterology | Admitting: Gastroenterology

## 2016-02-25 ENCOUNTER — Encounter: Payer: Self-pay | Admitting: *Deleted

## 2016-02-25 DIAGNOSIS — Z8371 Family history of colonic polyps: Secondary | ICD-10-CM | POA: Diagnosis not present

## 2016-02-25 DIAGNOSIS — E1142 Type 2 diabetes mellitus with diabetic polyneuropathy: Secondary | ICD-10-CM | POA: Diagnosis not present

## 2016-02-25 DIAGNOSIS — K5730 Diverticulosis of large intestine without perforation or abscess without bleeding: Secondary | ICD-10-CM | POA: Diagnosis not present

## 2016-02-25 DIAGNOSIS — G4730 Sleep apnea, unspecified: Secondary | ICD-10-CM | POA: Insufficient documentation

## 2016-02-25 DIAGNOSIS — Z7982 Long term (current) use of aspirin: Secondary | ICD-10-CM | POA: Diagnosis not present

## 2016-02-25 DIAGNOSIS — Z881 Allergy status to other antibiotic agents status: Secondary | ICD-10-CM | POA: Diagnosis not present

## 2016-02-25 DIAGNOSIS — Z1211 Encounter for screening for malignant neoplasm of colon: Secondary | ICD-10-CM | POA: Insufficient documentation

## 2016-02-25 DIAGNOSIS — E039 Hypothyroidism, unspecified: Secondary | ICD-10-CM | POA: Diagnosis not present

## 2016-02-25 DIAGNOSIS — Z7984 Long term (current) use of oral hypoglycemic drugs: Secondary | ICD-10-CM | POA: Diagnosis not present

## 2016-02-25 DIAGNOSIS — Z6836 Body mass index (BMI) 36.0-36.9, adult: Secondary | ICD-10-CM | POA: Insufficient documentation

## 2016-02-25 DIAGNOSIS — I10 Essential (primary) hypertension: Secondary | ICD-10-CM | POA: Diagnosis not present

## 2016-02-25 DIAGNOSIS — D123 Benign neoplasm of transverse colon: Secondary | ICD-10-CM | POA: Insufficient documentation

## 2016-02-25 DIAGNOSIS — Z86010 Personal history of colonic polyps: Secondary | ICD-10-CM | POA: Diagnosis not present

## 2016-02-25 DIAGNOSIS — M1990 Unspecified osteoarthritis, unspecified site: Secondary | ICD-10-CM | POA: Insufficient documentation

## 2016-02-25 DIAGNOSIS — J449 Chronic obstructive pulmonary disease, unspecified: Secondary | ICD-10-CM | POA: Diagnosis not present

## 2016-02-25 DIAGNOSIS — D680 Von Willebrand's disease: Secondary | ICD-10-CM | POA: Diagnosis not present

## 2016-02-25 DIAGNOSIS — Z79899 Other long term (current) drug therapy: Secondary | ICD-10-CM | POA: Diagnosis not present

## 2016-02-25 DIAGNOSIS — Z888 Allergy status to other drugs, medicaments and biological substances status: Secondary | ICD-10-CM | POA: Diagnosis not present

## 2016-02-25 DIAGNOSIS — E7800 Pure hypercholesterolemia, unspecified: Secondary | ICD-10-CM | POA: Insufficient documentation

## 2016-02-25 DIAGNOSIS — Z8541 Personal history of malignant neoplasm of cervix uteri: Secondary | ICD-10-CM | POA: Diagnosis not present

## 2016-02-25 DIAGNOSIS — K219 Gastro-esophageal reflux disease without esophagitis: Secondary | ICD-10-CM | POA: Diagnosis not present

## 2016-02-25 DIAGNOSIS — D122 Benign neoplasm of ascending colon: Secondary | ICD-10-CM | POA: Insufficient documentation

## 2016-02-25 DIAGNOSIS — D125 Benign neoplasm of sigmoid colon: Secondary | ICD-10-CM | POA: Insufficient documentation

## 2016-02-25 DIAGNOSIS — Z8673 Personal history of transient ischemic attack (TIA), and cerebral infarction without residual deficits: Secondary | ICD-10-CM | POA: Insufficient documentation

## 2016-02-25 DIAGNOSIS — K635 Polyp of colon: Secondary | ICD-10-CM | POA: Diagnosis not present

## 2016-02-25 HISTORY — DX: Type 2 diabetes mellitus with diabetic polyneuropathy: E11.42

## 2016-02-25 HISTORY — PX: COLONOSCOPY WITH PROPOFOL: SHX5780

## 2016-02-25 HISTORY — DX: Transient cerebral ischemic attack, unspecified: G45.9

## 2016-02-25 HISTORY — DX: Sleep apnea, unspecified: G47.30

## 2016-02-25 HISTORY — DX: Von Willebrand disease, unspecified: D68.00

## 2016-02-25 HISTORY — DX: Von Willebrand's disease: D68.0

## 2016-02-25 HISTORY — DX: Pure hypercholesterolemia, unspecified: E78.00

## 2016-02-25 HISTORY — DX: Hypothyroidism, unspecified: E03.9

## 2016-02-25 LAB — GLUCOSE, CAPILLARY: GLUCOSE-CAPILLARY: 138 mg/dL — AB (ref 65–99)

## 2016-02-25 SURGERY — COLONOSCOPY WITH PROPOFOL
Anesthesia: General

## 2016-02-25 MED ORDER — PROPOFOL 500 MG/50ML IV EMUL
INTRAVENOUS | Status: AC
  Filled 2016-02-25: qty 50

## 2016-02-25 MED ORDER — LIDOCAINE HCL (CARDIAC) 20 MG/ML IV SOLN
INTRAVENOUS | Status: DC | PRN
  Administered 2016-02-25: 1.5 mL via INTRAVENOUS

## 2016-02-25 MED ORDER — LIDOCAINE HCL (PF) 2 % IJ SOLN
INTRAMUSCULAR | Status: AC
  Filled 2016-02-25: qty 2

## 2016-02-25 MED ORDER — PROPOFOL 10 MG/ML IV BOLUS
INTRAVENOUS | Status: DC | PRN
  Administered 2016-02-25: 20 mg via INTRAVENOUS
  Administered 2016-02-25: 30 mg via INTRAVENOUS
  Administered 2016-02-25: 20 mg via INTRAVENOUS

## 2016-02-25 MED ORDER — SODIUM CHLORIDE 0.9 % IV SOLN
INTRAVENOUS | Status: DC
  Administered 2016-02-25: 08:00:00 via INTRAVENOUS

## 2016-02-25 MED ORDER — SODIUM CHLORIDE 0.9 % IV SOLN: INTRAVENOUS | Status: DC

## 2016-02-25 MED ORDER — PROPOFOL 500 MG/50ML IV EMUL
INTRAVENOUS | Status: DC | PRN
  Administered 2016-02-25: 120 ug/kg/min via INTRAVENOUS

## 2016-02-25 NOTE — Op Note (Signed)
[facility]enter[facility] Patient Name: Samantha Henry Procedure Date: 02/25/2016 7:56 AM MRN: [id number] Account #: [id number] Date of Birth: 11-05-1945 Admit Type: Outpatient Age: 71 Room: [facility] ENDO ROOM 3 Gender: Female Note Status: Finalized Procedure:            Colonoscopy Indications:          Family history of colonic polyps in a first-degree                        relative, Personal history of colonic polyps Providers:            Lollie Sails, MD Referring MD:         Kirstie Peri. Caryn Section, MD (Referring MD) Medicines:            Monitored Anesthesia Care Complications:        No immediate complications. Procedure:            Pre-Anesthesia Assessment:                       - ASA Grade Assessment: III - A patient with severe                        systemic disease.                       After obtaining informed consent, the colonoscope was                        passed under direct vision. Throughout the procedure,                        the patient's blood pressure, pulse, and oxygen                        saturations were monitored continuously. The                        Colonoscope was introduced through the anus and                        advanced to the the cecum, identified by appendiceal                        orifice and ileocecal valve. The quality of the bowel                        preparation was good. Findings:      Multiple small-mouthed diverticula were found in the sigmoid colon,       descending colon and transverse colon.      A 2 mm polyp was found in the proximal ascending colon. The polyp was       sessile. The polyp was removed with a cold biopsy forceps. Resection and       retrieval were complete.      A 9 mm polyp was found in the splenic flexure. The polyp was sessile.       The polyp was removed with a cold snare. Resection and retrieval were       complete.      A 5 mm polyp was found in the proximal sigmoid colon. The  polyp was  semi-pedunculated. The polyp was removed with a cold snare. Resection       and retrieval were complete. To prevent bleeding after the polypectomy,       one hemostatic clip was successfully placed. There was no bleeding at       the end of the maneuver.      The retroflexed view of the distal rectum and anal verge was normal and       showed no anal or rectal abnormalities.      The digital rectal exam was normal.      The digital rectal exam was normal. Impression:           - Diverticulosis in the sigmoid colon, in the                        descending colon and in the transverse colon.                       - One 2 mm polyp in the proximal ascending colon,                        removed with a cold biopsy forceps. Resected and                        retrieved.                       - One 9 mm polyp at the splenic flexure, removed with a                        cold snare. Resected and retrieved.                       - One 5 mm polyp in the proximal sigmoid colon, removed                        with a cold snare. Resected and retrieved. Clip was                        placed. Recommendation:       - Discharge patient to home.                       - Discharge patient to home.                       - Telephone GI clinic for pathology results in 1 week.                       - Low residue diet for 3 days, then advance as                        tolerated to advance diet as tolerated. Procedure Code(s):    --- Professional ---                       [phone], Colonoscopy, flexible; with removal of tumor(s),                        polyp(s), or other lesion(s) by snare technique  17510, 77, Colonoscopy, flexible; with biopsy, single                        or multiple Diagnosis Code(s):    --- Professional ---                       D12.2, Benign neoplasm of ascending colon                       D12.3, Benign neoplasm of transverse colon (hepatic                         flexure or splenic flexure)                       D12.5, Benign neoplasm of sigmoid colon                       Z83.71, Family history of colonic polyps                       Z86.010, Personal history of colonic polyps                       K57.30, Diverticulosis of large intestine without                        perforation or abscess without bleeding CPT copyright 2016 [employer]. All rights reserved. The codes documented in this report are preliminary and upon coder review may  be revised to meet current compliance requirements. Lollie Sails, MD 02/25/2016 9:11:14 AM This report has been signed electronically. Number of Addenda: 0 Note Initiated On: 02/25/2016 7:56 AM Scope Withdrawal Time: 0 hours 22 minutes 7 seconds  Total Procedure Duration: 0 hours 43 minutes 11 seconds       [facility]

## 2016-02-25 NOTE — Anesthesia Preprocedure Evaluation (Signed)
Anesthesia Evaluation  Patient identified by MRN, date of birth, ID band Patient awake    Reviewed: Allergy & Precautions, NPO status , Patient's Chart, lab work & pertinent test results  Airway Mallampati: II       Dental  (+) Teeth Intact, Upper Dentures   Pulmonary sleep apnea and Continuous Positive Airway Pressure Ventilation , COPD, former smoker,     + decreased breath sounds      Cardiovascular Exercise Tolerance: Good hypertension, Pt. on medications  Rhythm:Regular Rate:Normal     Neuro/Psych TIA   GI/Hepatic Neg liver ROS, GERD  Medicated,  Endo/Other  diabetes, Type 2, Oral Hypoglycemic AgentsHypothyroidism Morbid obesity  Renal/GU negative Renal ROS     Musculoskeletal   Abdominal (+) + obese,   Peds negative pediatric ROS (+)  Hematology   Anesthesia Other Findings   Reproductive/Obstetrics                             Anesthesia Physical Anesthesia Plan  ASA: III  Anesthesia Plan: General   Post-op Pain Management:    Induction: Intravenous  Airway Management Planned: Natural Airway and Nasal Cannula  Additional Equipment:   Intra-op Plan:   Post-operative Plan:   Informed Consent: I have reviewed the patients History and Physical, chart, labs and discussed the procedure including the risks, benefits and alternatives for the proposed anesthesia with the patient or authorized representative who has indicated his/her understanding and acceptance.     Plan Discussed with: Surgeon  Anesthesia Plan Comments:         Anesthesia Quick Evaluation

## 2016-02-25 NOTE — H&P (Signed)
Outpatient short stay form Pre-procedure 02/25/2016 7:59 AM Samantha Sails MD  Primary Physician: Dr. Lelon Huh  Reason for visit:  Colonoscopy  History of present illness:  Patient is a 71 year old female presenting today as above. Tolerated her prep well. She does take a daily aspirin that has been held. She takes no other aspirin products or blood thinning agents. There is a history of a low penetrance von Willebrand's however patient denies any bleeding issues.    Current Facility-Administered Medications:  .  0.9 %  sodium chloride infusion, , Intravenous, Continuous, Samantha Sails, MD .  0.9 %  sodium chloride infusion, , Intravenous, Continuous, Samantha Sails, MD  Prescriptions Prior to Admission  Medication Sig Dispense Refill Last Dose  . albuterol (PROVENTIL HFA;VENTOLIN HFA) 108 (90 Base) MCG/ACT inhaler Inhale 2 puffs into the lungs every 6 (six) hours as needed for wheezing or shortness of breath.   02/25/2016 at 0630  . aspirin 81 MG tablet Take 1 tablet by mouth daily.   Past Week at Unknown time  . DULoxetine HCl 40 MG CPEP Take 40 mg by mouth daily.   Past Week at Unknown time  . levothyroxine (SYNTHROID, LEVOTHROID) 125 MCG tablet Take 1 tablet (125 mcg total) by mouth daily before breakfast. 90 tablet 2 Past Week at Unknown time  . losartan (COZAAR) 50 MG tablet TAKE ONE TABLET BY MOUTH ONCE DAILY 30 tablet 12 Past Week at Unknown time  . metFORMIN (GLUCOPHAGE-XR) 500 MG 24 hr tablet TAKE TWO TABLETS BY MOUTH IN THE MORNING AND TWO IN THE EVENING 360 tablet 4 Past Week at Unknown time  . MULTIPLE VITAMIN PO Take 1 tablet by mouth daily.   Past Week at Unknown time  . nortriptyline (PAMELOR) 10 MG capsule Take 10 mg by mouth 2 (two) times daily.   Past Week at Unknown time  . Omega-3 Fatty Acids (FISH OIL BURP-LESS) 1000 MG CAPS Take 2 tablets by mouth daily.    Past Week at Unknown time  . RABEprazole (ACIPHEX) 20 MG tablet TAKE ONE TABLET BY MOUTH ONCE  DAILY 30 tablet 12 Past Week at Unknown time  . rosuvastatin (CRESTOR) 20 MG tablet TAKE ONE TABLET BY MOUTH ONCE DAILY 90 tablet 4 Past Week at Unknown time  . budesonide-formoterol (SYMBICORT) 160-4.5 MCG/ACT inhaler Inhale 2 puffs into the lungs 2 (two) times daily.   Taking  . Calcium-Magnesium-Vitamin D (CALCIUM 500 PO) Take 1 tablet by mouth 2 (two) times daily.   Taking  . cholecalciferol (VITAMIN D) 400 units TABS tablet Take 400 Units by mouth daily.   Taking  . furosemide (LASIX) 20 MG tablet Take 1 tablet (20 mg total) by mouth as needed. 30 tablet 5 Taking  . ibuprofen (ADVIL,MOTRIN) 200 MG tablet Take 1-2 tablets by mouth as needed.   Taking  . ONE TOUCH ULTRA TEST test strip USE TO CHECK BLOOD GLUCOSE ONCE DAILY 50 each 9   . tiotropium (SPIRIVA HANDIHALER) 18 MCG inhalation capsule Place 1 Inhaler into inhaler and inhale daily.   Taking     Allergies  Allergen Reactions  . Aspirin   . Lyrica [Pregabalin] Other (See Comments)    Dizziness  . Tetracycline      Past Medical History:  Diagnosis Date  . Arthritis    osteo  . Cancer ([place])    CERVICAL CA  . COPD (chronic obstructive pulmonary disease) ([place])   . Diabetes mellitus without complication ([place])   . Diabetic peripheral neuropathy (  [facility])   . GERD (gastroesophageal reflux disease)   . History of cervical cancer   . Hypercholesteremia   . Hypertension   . Hypothyroidism   . Sleep apnea   . TIA (transient ischemic attack)   . Von Willebrand's disease [facility])     Review of systems:      Physical Exam    Heart and lungs: Regular rate and rhythm without rub or gallop lungs are bilaterally clear.    HEENT: Normocephalic atraumatic eyes are anicteric    Other:     Pertinant exam for procedure: Soft nontender nondistended bowel sounds positive normoactive.    Planned proceedures: Colonoscopy and indicated procedures. I have discussed the risks benefits and complications of procedures to include not limited  to bleeding, infection, perforation and the risk of sedation and the patient wishes to proceed.    Samantha Sails, MD Gastroenterology 02/25/2016  7:59 AM

## 2016-02-25 NOTE — Transfer of Care (Signed)
Immediate Anesthesia Transfer of Care Note  Patient: Samantha Henry  Procedure(s) Performed: Procedure(s): COLONOSCOPY WITH PROPOFOL (N/A)  Patient Location: PACU  Anesthesia Type:General  Level of Consciousness: awake  Airway & Oxygen Therapy: Patient Spontanous Breathing and Patient connected to nasal cannula oxygen  Post-op Assessment: Report given to RN and Post -op Vital signs reviewed and stable  Post vital signs: Reviewed  Last Vitals:  Vitals:   02/25/16 0755  BP: 139/68  Pulse: 91  Resp: 18  Temp: (!) 36 C    Last Pain:  Vitals:   02/25/16 0755  TempSrc: Tympanic         Complications: No apparent anesthesia complications

## 2016-02-25 NOTE — Anesthesia Post-op Follow-up Note (Signed)
Anesthesia QCDR form completed.        

## 2016-02-26 ENCOUNTER — Encounter: Payer: Self-pay | Admitting: Gastroenterology

## 2016-02-26 LAB — SURGICAL PATHOLOGY

## 2016-02-26 NOTE — Anesthesia Postprocedure Evaluation (Signed)
Anesthesia Post Note  Patient: Samantha Henry  Procedure(s) Performed: Procedure(s) (LRB): COLONOSCOPY WITH PROPOFOL (N/A)  Patient location during evaluation: PACU Anesthesia Type: General Level of consciousness: awake Pain management: pain level controlled Vital Signs Assessment: post-procedure vital signs reviewed and stable Respiratory status: nonlabored ventilation Cardiovascular status: stable Anesthetic complications: no     Last Vitals:  Vitals:   02/25/16 0920 02/25/16 0940  BP: 114/68 108/61  Pulse: 86 78  Resp: 15 15  Temp:      Last Pain:  Vitals:   02/26/16 0747  TempSrc:   PainSc: 0-No pain                 VAN STAVEREN,Johnson [place]

## 2016-02-27 ENCOUNTER — Encounter: Payer: Self-pay | Admitting: Family Medicine

## 2016-03-11 DIAGNOSIS — Z7984 Long term (current) use of oral hypoglycemic drugs: Secondary | ICD-10-CM | POA: Diagnosis not present

## 2016-03-11 DIAGNOSIS — J449 Chronic obstructive pulmonary disease, unspecified: Secondary | ICD-10-CM | POA: Insufficient documentation

## 2016-03-11 DIAGNOSIS — I10 Essential (primary) hypertension: Secondary | ICD-10-CM | POA: Diagnosis not present

## 2016-03-11 DIAGNOSIS — Z79899 Other long term (current) drug therapy: Secondary | ICD-10-CM | POA: Diagnosis not present

## 2016-03-11 DIAGNOSIS — Z8541 Personal history of malignant neoplasm of cervix uteri: Secondary | ICD-10-CM | POA: Diagnosis not present

## 2016-03-11 DIAGNOSIS — Z87891 Personal history of nicotine dependence: Secondary | ICD-10-CM | POA: Insufficient documentation

## 2016-03-11 DIAGNOSIS — E119 Type 2 diabetes mellitus without complications: Secondary | ICD-10-CM | POA: Insufficient documentation

## 2016-03-11 DIAGNOSIS — M25562 Pain in left knee: Secondary | ICD-10-CM | POA: Insufficient documentation

## 2016-03-11 DIAGNOSIS — Z791 Long term (current) use of non-steroidal anti-inflammatories (NSAID): Secondary | ICD-10-CM | POA: Insufficient documentation

## 2016-03-11 DIAGNOSIS — Z7982 Long term (current) use of aspirin: Secondary | ICD-10-CM | POA: Diagnosis not present

## 2016-03-11 DIAGNOSIS — G8929 Other chronic pain: Secondary | ICD-10-CM | POA: Diagnosis not present

## 2016-03-11 DIAGNOSIS — E039 Hypothyroidism, unspecified: Secondary | ICD-10-CM | POA: Insufficient documentation

## 2016-03-11 NOTE — ED Triage Notes (Signed)
Patient reports left knee pain "for awhile".  Reports fell and injured left leg approximately 2 years ago.  Reports has fallen several times since because knee "gives out".

## 2016-03-12 ENCOUNTER — Emergency Department
Admission: EM | Admit: 2016-03-12 | Discharge: 2016-03-12 | Disposition: A | Discharge status: Home / Self Care | Payer: PPO | Attending: Emergency Medicine | Admitting: Emergency Medicine

## 2016-03-12 DIAGNOSIS — M25562 Pain in left knee: Secondary | ICD-10-CM

## 2016-03-12 DIAGNOSIS — G8929 Other chronic pain: Secondary | ICD-10-CM

## 2016-03-12 NOTE — Discharge Instructions (Signed)
In the absence of a new or acute medical problem, there is very little that we can offer to help with your knee pain.  We recommend you read over the included information and try elevating her knee, using cold packs if that helps, and try using over-the-counter medication such as ibuprofen and Tylenol.  Please follow up with an orthopedic specialist at the next available opportunity for further evaluation of your chronic knee pain.

## 2016-03-12 NOTE — ED Provider Notes (Signed)
[facility] Emergency Department Provider Note  ____________________________________________   First MD Initiated Contact with Patient 03/12/16 0210     (approximate)  I have reviewed the triage vital signs and the nursing notes.   HISTORY  Chief Complaint Knee Pain    HPI Samantha Henry is a 71 y.o. female who presents for evaluation of pain in her left knee.  She states that the pain has been present for about 2 years after a fall.  She states that it has been gradually getting worse particularly over the last 4-6 months.  She has had occasional falls for her knee gives out but that has not happened recently.  He has had no recent falls, traumas, or injuries of which she is aware.  Her pain is moderate and worse when she first gets up to walk on it but then gets better with a few steps.  She has normal sensation and normal strength in the leg, just feels like the joint gives out at times.  Weightbearing makes it worse and nothing in particular makes it better.  She has been to an urgent care several months ago.  She went to see an orthopedic doctor 2 years ago who suggested an injection of cortisone but she decided she did not want that and she has not gone back to a specialist.  Past Medical History:  Diagnosis Date  . Arthritis    osteo  . Cancer ([place])    CERVICAL CA  . COPD (chronic obstructive pulmonary disease) ([place])   . Diabetes mellitus without complication ([place])   . Diabetic peripheral neuropathy ([place])   . GERD (gastroesophageal reflux disease)   . History of cervical cancer   . Hypercholesteremia   . Hypertension   . Hypothyroidism   . Sleep apnea   . TIA (transient ischemic attack)   . Von Willebrand's disease [facility])     Patient Active Problem List   Diagnosis Date Noted  . Polyneuropathy ([place]) 02/26/2015  . Osteoarthritis 08/02/2014  . Allergic rhinitis 07/29/2014  . COPD (chronic obstructive pulmonary disease) ([place]) 07/25/2014  .  Diabetes ([place]) 07/25/2014  . Diverticulosis of colon 07/25/2014  . Edema 07/25/2014  . GERD (gastroesophageal reflux disease) 07/25/2014  . Hypercholesteremia 07/25/2014  . Hypertension 07/25/2014  . Hypothyroid 07/25/2014  . Cramps of lower extremity 07/25/2014  . Microalbuminuria 07/25/2014  . Nail dystrophy 07/25/2014  . Osteoarthritis of leg 07/25/2014  . Restless legs syndrome 07/25/2014  . TIA (transient ischemic attack) 07/25/2014  . Von Willebrand's disease ([place]) 07/25/2014  . OSA (obstructive sleep apnea) 10/31/2013  . History of adenomatous polyp of colon 08/26/2005    Past Surgical History:  Procedure Laterality Date  . ABDOMINAL HYSTERECTOMY  1971   due to cervical cancer   . APPENDECTOMY    . BLADDER SURGERY  1997  . BREAST BIOPSY Left 01/21/2015   NEG  . CARDIAC CATHETERIZATION    . CARPAL TUNNEL RELEASE  2010  . colonoscopy and upper endoscopy'    . COLONOSCOPY WITH PROPOFOL N/A 02/25/2016   Procedure: COLONOSCOPY WITH PROPOFOL;  Surgeon: Lollie Sails, MD;  Location: [facility] ENDOSCOPY;  Service: Endoscopy;  Laterality: N/A;  . TENDON RELEASE  2010   right foot, Dr. Vickki Muff     Prior to Admission medications   Medication Sig Start Date End Date Taking? Authorizing Provider  albuterol (PROVENTIL HFA;VENTOLIN HFA) 108 (90 Base) MCG/ACT inhaler Inhale 2 puffs into the lungs every 6 (six) hours as needed for  wheezing or shortness of breath.    Historical Provider, MD  aspirin 81 MG tablet Take 1 tablet by mouth daily.    Historical Provider, MD  budesonide-formoterol (SYMBICORT) 160-4.5 MCG/ACT inhaler Inhale 2 puffs into the lungs 2 (two) times daily. 08/24/11   Historical Provider, MD  Calcium-Magnesium-Vitamin D (CALCIUM 500 PO) Take 1 tablet by mouth 2 (two) times daily.    Historical Provider, MD  cholecalciferol (VITAMIN D) 400 units TABS tablet Take 400 Units by mouth daily.    Historical Provider, MD  DULoxetine HCl 40 MG CPEP Take 40 mg by mouth daily.     Historical Provider, MD  furosemide (LASIX) 20 MG tablet Take 1 tablet (20 mg total) by mouth as needed. 07/24/15   Birdie Sons, MD  ibuprofen (ADVIL,MOTRIN) 200 MG tablet Take 1-2 tablets by mouth as needed. 10/12/12   Historical Provider, MD  levothyroxine (SYNTHROID, LEVOTHROID) 125 MCG tablet Take 1 tablet (125 mcg total) by mouth daily before breakfast. 01/22/16   Birdie Sons, MD  losartan (COZAAR) 50 MG tablet TAKE ONE TABLET BY MOUTH ONCE DAILY 02/19/16   Birdie Sons, MD  metFORMIN (GLUCOPHAGE-XR) 500 MG 24 hr tablet TAKE TWO TABLETS BY MOUTH IN THE MORNING AND TWO IN THE EVENING 12/21/15   Birdie Sons, MD  MULTIPLE VITAMIN PO Take 1 tablet by mouth daily.    Historical Provider, MD  nortriptyline (PAMELOR) 10 MG capsule Take 10 mg by mouth 2 (two) times daily.    Historical Provider, MD  Omega-3 Fatty Acids (FISH OIL BURP-LESS) 1000 MG CAPS Take 2 tablets by mouth daily.     Historical Provider, MD  ONE TOUCH ULTRA TEST test strip USE TO CHECK BLOOD GLUCOSE ONCE DAILY 01/29/16   Birdie Sons, MD  RABEprazole (ACIPHEX) 20 MG tablet TAKE ONE TABLET BY MOUTH ONCE DAILY 02/20/15   Birdie Sons, MD  rosuvastatin (CRESTOR) 20 MG tablet TAKE ONE TABLET BY MOUTH ONCE DAILY 08/02/15   Birdie Sons, MD  tiotropium (SPIRIVA HANDIHALER) 18 MCG inhalation capsule Place 1 Inhaler into inhaler and inhale daily.    Historical Provider, MD    Allergies Aspirin; Lyrica [pregabalin]; and Tetracycline  Family History  Problem Relation Age of Onset  . Alzheimer's disease Mother   . Cancer Father   . Diabetes Sister   . Diabetes Brother   . Hodgkin's lymphoma Sister   . Breast cancer Maternal Aunt     Social History Social History  Substance Use Topics  . Smoking status: Former Smoker    Packs/day: 1.50    Types: Cigarettes    Quit date: 01/11/2007  . Smokeless tobacco: Never Used  . Alcohol use No    Review of Systems Constitutional: No fever/chills ENT: No sore  throat. Cardiovascular: Denies chest pain. Respiratory: Denies shortness of breath. Musculoskeletal: Chronic pain and left knee Neurological: Negative for headaches, focal weakness or numbness.   ____________________________________________   PHYSICAL EXAM:  VITAL SIGNS: ED Triage Vitals [03/12/16 0011]  Enc Vitals Group     BP (!) 144/78     Pulse Rate 78     Resp 18     Temp 98 F (36.7 C)     Temp Source Oral     SpO2 96 %     Weight      Height      Head Circumference      Peak Flow      Pain Score 9  Pain Loc      Pain Edu?      Excl. in [place]?     Constitutional: Alert and oriented. Well appearing and in no acute distress. Eyes: Conjunctivae are normal. PERRL. EOMI. Head: Atraumatic. Cardiovascular: Normal rate, regular rhythm. Good peripheral circulation.  Respiratory: Normal respiratory effort.  No retractions.  Gastrointestinal: Soft and nontender. No distention.  Musculoskeletal: No knee effusion on either side.  She has some tenderness to palpation around the left knee and some tenderness with passive range of motion but there is nothing grossly abnormal about the exam.  She is ambulatory without any difficulty.  There is no laxity in the joint with Lachman test. Neurologic:  Normal speech and language. No gross focal neurologic deficits are appreciated.  Skin:  Skin is warm, dry and intact. No rash noted.   ____________________________________________   LABS (all labs ordered are listed, but only abnormal results are displayed)  Labs Reviewed - No data to display ____________________________________________  EKG  None - EKG not ordered by ED physician ____________________________________________  RADIOLOGY   No results found.  ____________________________________________   PROCEDURES  Procedure(s) performed:   Procedures   Critical Care performed: No ____________________________________________   INITIAL IMPRESSION / ASSESSMENT  AND PLAN / ED COURSE  Pertinent labs & imaging results that were available during my care of the patient were reviewed by me and considered in my medical decision making (see chart for details).  The patient is well-appearing and in no acute distress with no recent injuries or traumas.  I explained to her that she has a chronic knee problem probably related to osteoarthritis and that I am unable to fix the issue in the ED.  I offered radiographs but also suggested she may want to wait until she sees the specialist and then that way they can order exactly what they want, and she agreed that this makes sense.  I gave my usual and customary return precautions.      Clinical Course as of Mar 12 356  Sat Mar 12, 2016  0211 I reviewed the patient's prescription history over the last 12 months in the [place], and the patient has filled no controlled substances during that time.   [CF]    Clinical Course User Index [CF] Hinda Kehr, MD    ____________________________________________  FINAL CLINICAL IMPRESSION(S) / ED DIAGNOSES  Final diagnoses:  Chronic pain of left knee     MEDICATIONS GIVEN DURING THIS VISIT:  Medications - No data to display   NEW OUTPATIENT MEDICATIONS STARTED DURING THIS VISIT:  Discharge Medication List as of 03/12/2016  2:24 AM      Discharge Medication List as of 03/12/2016  2:24 AM      Discharge Medication List as of 03/12/2016  2:24 AM       Note:  This document was prepared using Dragon voice recognition software and may include unintentional dictation errors.    Hinda Kehr, MD 03/12/16 [phone]

## 2016-03-12 NOTE — ED Notes (Signed)
Patient c/o chronic left knee pain. Pt reports hx of previous fall 2 years ago with chronic issues since. Pt reports hx of frequent falls since injury. Patient reports last fall in October.

## 2016-03-14 ENCOUNTER — Telehealth: Payer: Self-pay | Admitting: Family Medicine

## 2016-03-14 NOTE — Telephone Encounter (Signed)
Pt was discharged from [facility] ER 03/12/16 for left knee pain.  I have scheduled a follow up appointment/MW

## 2016-03-15 ENCOUNTER — Ambulatory Visit (INDEPENDENT_AMBULATORY_CARE_PROVIDER_SITE_OTHER): Payer: PPO | Admitting: Family Medicine

## 2016-03-15 ENCOUNTER — Encounter: Payer: Self-pay | Admitting: Family Medicine

## 2016-03-15 VITALS — BP 120/78 | HR 85 | Temp 98.6°F | Resp 16 | Wt 226.0 lb

## 2016-03-15 DIAGNOSIS — M25562 Pain in left knee: Secondary | ICD-10-CM | POA: Diagnosis not present

## 2016-03-15 DIAGNOSIS — G8929 Other chronic pain: Secondary | ICD-10-CM | POA: Diagnosis not present

## 2016-03-15 MED ORDER — NAPROXEN 500 MG PO TABS: 500.0000 mg | ORAL_TABLET | Freq: Two times a day (BID) | ORAL | 0 refills | Status: DC

## 2016-03-15 NOTE — Progress Notes (Signed)
Patient: Samantha Henry Female    DOB: 12-Mar-1945   71 y.o.   MRN: [id number] Visit Date: 03/15/2016  Today's Provider: Lelon Huh, MD   Chief Complaint  Patient presents with  . Follow-up  . Knee Pain   Subjective:    HPI  Follow up ER visit/ Chronic Knee pain  Patient was seen in ER for left knee pain on 03/12/2016. She was treated for Chronic pain of the right knee. Patient was advised to use OTC medications such as Tylenol or Ibuprofen. Patient was also advised to follow up with an orthopedic specialist as an outpatient for further evaluation of chronic knee pain.  She reports fair compliance with treatment. Has teen taking Tylenol Arthritis for her knee pain. She reports this condition is Unchanged. Patient is here today for a referral to see a specialist.  States has had several falls the last few months and pain gets worse every time. Taken OTC Tylenol arthritis which is no longer effective.  ------------------------------------------------------------------------------------      Allergies  Allergen Reactions  . Aspirin   . Lyrica [Pregabalin] Other (See Comments)    Dizziness  . Tetracycline      Current Outpatient Prescriptions:  .  albuterol (PROVENTIL HFA;VENTOLIN HFA) 108 (90 Base) MCG/ACT inhaler, Inhale 2 puffs into the lungs every 6 (six) hours as needed for wheezing or shortness of breath., Disp: , Rfl:  .  aspirin 81 MG tablet, Take 1 tablet by mouth daily., Disp: , Rfl:  .  budesonide-formoterol (SYMBICORT) 160-4.5 MCG/ACT inhaler, Inhale 2 puffs into the lungs 2 (two) times daily., Disp: , Rfl:  .  Calcium-Magnesium-Vitamin D (CALCIUM 500 PO), Take 1 tablet by mouth 2 (two) times daily., Disp: , Rfl:  .  cholecalciferol (VITAMIN D) 400 units TABS tablet, Take 400 Units by mouth daily., Disp: , Rfl:  .  DULoxetine HCl 40 MG CPEP, Take 40 mg by mouth daily., Disp: , Rfl:  .  furosemide (LASIX) 20 MG tablet, Take 1 tablet (20 mg total) by mouth  as needed., Disp: 30 tablet, Rfl: 5 .  ibuprofen (ADVIL,MOTRIN) 200 MG tablet, Take 1-2 tablets by mouth as needed., Disp: , Rfl:  .  levothyroxine (SYNTHROID, LEVOTHROID) 125 MCG tablet, Take 1 tablet (125 mcg total) by mouth daily before breakfast., Disp: 90 tablet, Rfl: 2 .  losartan (COZAAR) 50 MG tablet, TAKE ONE TABLET BY MOUTH ONCE DAILY, Disp: 30 tablet, Rfl: 12 .  metFORMIN (GLUCOPHAGE-XR) 500 MG 24 hr tablet, TAKE TWO TABLETS BY MOUTH IN THE MORNING AND TWO IN THE EVENING, Disp: 360 tablet, Rfl: 4 .  MULTIPLE VITAMIN PO, Take 1 tablet by mouth daily., Disp: , Rfl:  .  nortriptyline (PAMELOR) 10 MG capsule, Take 10 mg by mouth 2 (two) times daily., Disp: , Rfl:  .  Omega-3 Fatty Acids (FISH OIL BURP-LESS) 1000 MG CAPS, Take 2 tablets by mouth daily. , Disp: , Rfl:  .  ONE TOUCH ULTRA TEST test strip, USE TO CHECK BLOOD GLUCOSE ONCE DAILY, Disp: 50 each, Rfl: 9 .  RABEprazole (ACIPHEX) 20 MG tablet, TAKE ONE TABLET BY MOUTH ONCE DAILY, Disp: 30 tablet, Rfl: 12 .  rosuvastatin (CRESTOR) 20 MG tablet, TAKE ONE TABLET BY MOUTH ONCE DAILY, Disp: 90 tablet, Rfl: 4 .  tiotropium (SPIRIVA HANDIHALER) 18 MCG inhalation capsule, Place 1 Inhaler into inhaler and inhale daily., Disp: , Rfl:   Review of Systems  Constitutional: Negative for appetite change, chills, fatigue and fever.  Respiratory: Negative for chest tightness and shortness of breath.   Cardiovascular: Negative for chest pain and palpitations.  Gastrointestinal: Negative for abdominal pain, nausea and vomiting.  Musculoskeletal: Positive for arthralgias (left knee pain), gait problem and joint swelling (left knee).  Neurological: Negative for dizziness and weakness.    Social History  Substance Use Topics  . Smoking status: Former Smoker    Packs/day: 1.50    Types: Cigarettes    Quit date: 01/11/2007  . Smokeless tobacco: Never Used  . Alcohol use No   Objective:   BP 120/78 (BP Location: Left Arm, Patient Position:  Sitting, Cuff Size: Large)   Pulse 85   Temp 98.6 F (37 C) (Oral)   Resp 16   Wt 226 lb (102.5 kg)   SpO2 96% Comment: room air  BMI 36.48 kg/m  There were no vitals filed for this visit.   Physical Exam  General appearance: alert, well developed, well nourished, cooperative and in no distress Head: Normocephalic, without obvious abnormality, atraumatic Respiratory: Respirations even and unlabored, normal respiratory rate Extremities: Right knee moderated swollen with diffuse tenderness around patella and joint lines. .     Assessment & Plan:     1. Chronic pain of left knee  - Ambulatory referral to Orthopedic Surgery - naproxen (NAPROSYN) 500 MG tablet; Take 1 tablet (500 mg total) by mouth 2 (two) times daily with a meal. As needed for knee pain  Dispense: 30 tablet; Refill: 0       Lelon Huh, MD  [facility]

## 2016-03-25 ENCOUNTER — Other Ambulatory Visit: Payer: Self-pay | Admitting: Family Medicine

## 2016-04-25 DIAGNOSIS — G479 Sleep disorder, unspecified: Secondary | ICD-10-CM | POA: Diagnosis not present

## 2016-04-25 DIAGNOSIS — M25562 Pain in left knee: Secondary | ICD-10-CM | POA: Diagnosis not present

## 2016-04-25 DIAGNOSIS — M1712 Unilateral primary osteoarthritis, left knee: Secondary | ICD-10-CM | POA: Diagnosis not present

## 2016-04-25 DIAGNOSIS — J439 Emphysema, unspecified: Secondary | ICD-10-CM | POA: Diagnosis not present

## 2016-04-25 DIAGNOSIS — R0602 Shortness of breath: Secondary | ICD-10-CM | POA: Diagnosis not present

## 2016-04-29 DIAGNOSIS — G8929 Other chronic pain: Secondary | ICD-10-CM | POA: Diagnosis not present

## 2016-04-29 DIAGNOSIS — M25562 Pain in left knee: Secondary | ICD-10-CM | POA: Diagnosis not present

## 2016-04-29 DIAGNOSIS — M25662 Stiffness of left knee, not elsewhere classified: Secondary | ICD-10-CM | POA: Diagnosis not present

## 2016-04-29 DIAGNOSIS — M6281 Muscle weakness (generalized): Secondary | ICD-10-CM | POA: Diagnosis not present

## 2016-05-02 DIAGNOSIS — M25562 Pain in left knee: Secondary | ICD-10-CM | POA: Diagnosis not present

## 2016-05-02 DIAGNOSIS — G8929 Other chronic pain: Secondary | ICD-10-CM | POA: Diagnosis not present

## 2016-05-02 DIAGNOSIS — M25662 Stiffness of left knee, not elsewhere classified: Secondary | ICD-10-CM | POA: Diagnosis not present

## 2016-05-02 DIAGNOSIS — M6281 Muscle weakness (generalized): Secondary | ICD-10-CM | POA: Diagnosis not present

## 2016-05-06 DIAGNOSIS — M6281 Muscle weakness (generalized): Secondary | ICD-10-CM | POA: Diagnosis not present

## 2016-05-06 DIAGNOSIS — G8929 Other chronic pain: Secondary | ICD-10-CM | POA: Diagnosis not present

## 2016-05-06 DIAGNOSIS — M25562 Pain in left knee: Secondary | ICD-10-CM | POA: Diagnosis not present

## 2016-05-09 DIAGNOSIS — M6281 Muscle weakness (generalized): Secondary | ICD-10-CM | POA: Diagnosis not present

## 2016-05-09 DIAGNOSIS — M25662 Stiffness of left knee, not elsewhere classified: Secondary | ICD-10-CM | POA: Diagnosis not present

## 2016-05-09 DIAGNOSIS — G8929 Other chronic pain: Secondary | ICD-10-CM | POA: Diagnosis not present

## 2016-05-09 DIAGNOSIS — M25562 Pain in left knee: Secondary | ICD-10-CM | POA: Diagnosis not present

## 2016-05-11 DIAGNOSIS — G8929 Other chronic pain: Secondary | ICD-10-CM | POA: Diagnosis not present

## 2016-05-11 DIAGNOSIS — M25562 Pain in left knee: Secondary | ICD-10-CM | POA: Diagnosis not present

## 2016-05-11 DIAGNOSIS — M6281 Muscle weakness (generalized): Secondary | ICD-10-CM | POA: Diagnosis not present

## 2016-05-11 DIAGNOSIS — M25662 Stiffness of left knee, not elsewhere classified: Secondary | ICD-10-CM | POA: Diagnosis not present

## 2016-05-17 DIAGNOSIS — M1712 Unilateral primary osteoarthritis, left knee: Secondary | ICD-10-CM | POA: Diagnosis not present

## 2016-05-20 ENCOUNTER — Encounter: Payer: Self-pay | Admitting: Family Medicine

## 2016-05-20 ENCOUNTER — Ambulatory Visit (INDEPENDENT_AMBULATORY_CARE_PROVIDER_SITE_OTHER): Payer: PPO | Admitting: Family Medicine

## 2016-05-20 VITALS — BP 122/70 | HR 72 | Temp 99.0°F | Resp 16 | Wt 223.0 lb

## 2016-05-20 DIAGNOSIS — E039 Hypothyroidism, unspecified: Secondary | ICD-10-CM | POA: Diagnosis not present

## 2016-05-20 DIAGNOSIS — E119 Type 2 diabetes mellitus without complications: Secondary | ICD-10-CM

## 2016-05-20 DIAGNOSIS — I10 Essential (primary) hypertension: Secondary | ICD-10-CM | POA: Diagnosis not present

## 2016-05-20 NOTE — Progress Notes (Signed)
Patient: Samantha Henry Female    DOB: [birth year]   71 y.o.   MRN: [id number] Visit Date: 05/20/2016  Today's Provider: Lelon Huh, MD   Chief Complaint  Patient presents with  . Diabetes  . Hypertension  . Hypothyroidism   Subjective:    HPI  Diabetes Mellitus Type II, Follow-up:   Lab Results  Component Value Date   HGBA1C 7.4 01/21/2016   HGBA1C 7.1 07/24/2015   HGBA1C 7.0 03/27/2015    Last seen for diabetes 4 months ago.  Management since then includes no changes. She reports good compliance with treatment. She is not having side effects.  Current symptoms include none and have been stable. Home blood sugar records: not being checked  Episodes of hypoglycemia? no   Current Insulin Regimen: none Most Recent Eye Exam: 01/19/2016 Weight trend: stable Prior visit with dietician: no Current diet: well balanced Current exercise: none  Pertinent Labs:    Component Value Date/Time   CHOL 151 01/21/2016 1226   TRIG 89 01/21/2016 1226   HDL 63 01/21/2016 1226   LDLCALC 70 01/21/2016 1226   CREATININE 0.58 01/21/2016 1226    Wt Readings from Last 3 Encounters:  05/20/16 223 lb (101.2 kg)  03/15/16 226 lb (102.5 kg)  02/25/16 228 lb (103.4 kg)     Hypertension, follow-up:  BP Readings from Last 3 Encounters:  05/20/16 122/70  03/15/16 120/78  03/12/16 (!) 144/78    She was last seen for hypertension 4 months ago.  BP at that visit was 150/80. Management since that visit includes no changes. She reports good compliance with treatment. She is not having side effects.  She is not exercising. She is adherent to low salt diet.   Outside blood pressures are checked occasionally. She is experiencing none.  Patient denies none.   Cardiovascular risk factors include diabetes mellitus.      Weight trend: stable Wt Readings from Last 3 Encounters:  05/20/16 223 lb (101.2 kg)  03/15/16 226 lb (102.5 kg)  02/25/16 228 lb (103.4 kg)     Current diet: well balanced   Hypothyroidism, follow up: Patient was last seen in the office 4 months ago. Since last OV her levothyroxine was decreased to 154mg daily. Patient reports good symptom control.      Allergies  Allergen Reactions  . Aspirin     AVOIDs due to history of Von Willebrands  . Lyrica [Pregabalin] Other (See Comments)    Dizziness  . Tetracycline      Current Outpatient Prescriptions:  .  albuterol (PROVENTIL HFA;VENTOLIN HFA) 108 (90 Base) MCG/ACT inhaler, Inhale 2 puffs into the lungs every 6 (six) hours as needed for wheezing or shortness of breath., Disp: , Rfl:  .  aspirin 81 MG tablet, Take 1 tablet by mouth daily., Disp: , Rfl:  .  budesonide-formoterol (SYMBICORT) 160-4.5 MCG/ACT inhaler, Inhale 2 puffs into the lungs 2 (two) times daily., Disp: , Rfl:  .  Calcium-Magnesium-Vitamin D (CALCIUM 500 PO), Take 1 tablet by mouth 2 (two) times daily., Disp: , Rfl:  .  cholecalciferol (VITAMIN D) 400 units TABS tablet, Take 400 Units by mouth daily., Disp: , Rfl:  .  DULoxetine HCl 40 MG CPEP, Take 40 mg by mouth daily., Disp: , Rfl:  .  furosemide (LASIX) 20 MG tablet, Take 1 tablet (20 mg total) by mouth as needed., Disp: 30 tablet, Rfl: 5 .  ibuprofen (ADVIL,MOTRIN) 200 MG tablet, Take 1-2 tablets by mouth  as needed., Disp: , Rfl:  .  levothyroxine (SYNTHROID, LEVOTHROID) 125 MCG tablet, Take 1 tablet (125 mcg total) by mouth daily before breakfast., Disp: 90 tablet, Rfl: 2 .  losartan (COZAAR) 50 MG tablet, TAKE ONE TABLET BY MOUTH ONCE DAILY, Disp: 30 tablet, Rfl: 12 .  metFORMIN (GLUCOPHAGE-XR) 500 MG 24 hr tablet, TAKE TWO TABLETS BY MOUTH IN THE MORNING AND TWO IN THE EVENING, Disp: 360 tablet, Rfl: 4 .  MULTIPLE VITAMIN PO, Take 1 tablet by mouth daily., Disp: , Rfl:  .  naproxen (NAPROSYN) 500 MG tablet, Take 1 tablet (500 mg total) by mouth 2 (two) times daily with a meal. As needed for knee pain, Disp: 30 tablet, Rfl: 0 .  nortriptyline  (PAMELOR) 10 MG capsule, Take 10 mg by mouth 2 (two) times daily., Disp: , Rfl:  .  Omega-3 Fatty Acids (FISH OIL BURP-LESS) 1000 MG CAPS, Take 2 tablets by mouth daily. , Disp: , Rfl:  .  ONE TOUCH ULTRA TEST test strip, USE TO CHECK BLOOD GLUCOSE ONCE DAILY, Disp: 50 each, Rfl: 9 .  RABEprazole (ACIPHEX) 20 MG tablet, TAKE ONE TABLET BY MOUTH ONCE DAILY, Disp: 30 tablet, Rfl: 12 .  rosuvastatin (CRESTOR) 20 MG tablet, TAKE ONE TABLET BY MOUTH ONCE DAILY, Disp: 90 tablet, Rfl: 4 .  tiotropium (SPIRIVA HANDIHALER) 18 MCG inhalation capsule, Place 1 Inhaler into inhaler and inhale daily., Disp: , Rfl:   Review of Systems  Constitutional: Negative.   HENT: Positive for congestion.   Respiratory: Positive for cough. Negative for apnea, choking, chest tightness, shortness of breath, wheezing and stridor.   Cardiovascular: Negative for chest pain, palpitations and leg swelling.  Endocrine: Negative.   Musculoskeletal: Negative.   Neurological: Negative.     Social History  Substance Use Topics  . Smoking status: Former Smoker    Packs/day: 1.50    Types: Cigarettes    Quit date: 01/11/2007  . Smokeless tobacco: Never Used  . Alcohol use No   Objective:   BP 122/70 (BP Location: Left Arm, Patient Position: Sitting, Cuff Size: Normal)   Pulse 72   Temp 99 F (37.2 C)   Resp 16   Wt 223 lb (101.2 kg)   SpO2 95%   BMI 35.99 kg/m     Physical Exam   General Appearance:    Alert, cooperative, no distress  Eyes:    PERRL, conjunctiva/corneas clear, EOM's intact       Lungs:     Clear to auscultation bilaterally, respirations unlabored  Heart:    Regular rate and rhythm  Neurologic:   Awake, alert, oriented x 3. No apparent focal neurological           defect.           Assessment & Plan:     1. Type 2 diabetes mellitus without complication, without long-term current use of insulin (HCC)  - Hemoglobin A1c  2. Essential hypertension Well controlled.  Continue current  medications.    3. Hypothyroidism, unspecified type  - TSH  The entirety of the information documented in the History of Present Illness, Review of Systems and Physical Exam were personally obtained by me. Portions of this information were initially documented by Wilburt Finlay, CMA and reviewed by me for thoroughness and accuracy.        Lelon Huh, MD  [facility]

## 2016-05-21 LAB — TSH: TSH: 0.175 u[IU]/mL — AB (ref 0.450–4.500)

## 2016-05-21 LAB — HEMOGLOBIN A1C
ESTIMATED AVERAGE GLUCOSE: 169 mg/dL
HEMOGLOBIN A1C: 7.5 % — AB (ref 4.8–5.6)

## 2016-06-01 ENCOUNTER — Encounter
Admission: RE | Admit: 2016-06-01 | Discharge: 2016-06-01 | Disposition: A | Discharge status: Home / Self Care | Payer: PPO | Source: Ambulatory Visit | Attending: Surgery | Admitting: Surgery

## 2016-06-01 ENCOUNTER — Ambulatory Visit
Admission: RE | Admit: 2016-06-01 | Discharge: 2016-06-01 | Disposition: A | Discharge status: Home / Self Care | Payer: PPO | Source: Ambulatory Visit | Attending: Surgery | Admitting: Surgery

## 2016-06-01 ENCOUNTER — Other Ambulatory Visit: Payer: Self-pay

## 2016-06-01 DIAGNOSIS — Z01818 Encounter for other preprocedural examination: Secondary | ICD-10-CM | POA: Insufficient documentation

## 2016-06-01 DIAGNOSIS — Z96651 Presence of right artificial knee joint: Secondary | ICD-10-CM | POA: Diagnosis not present

## 2016-06-01 DIAGNOSIS — Z01810 Encounter for preprocedural cardiovascular examination: Secondary | ICD-10-CM | POA: Diagnosis not present

## 2016-06-01 DIAGNOSIS — Z471 Aftercare following joint replacement surgery: Secondary | ICD-10-CM | POA: Diagnosis not present

## 2016-06-01 DIAGNOSIS — M1712 Unilateral primary osteoarthritis, left knee: Secondary | ICD-10-CM | POA: Diagnosis not present

## 2016-06-01 DIAGNOSIS — Z01812 Encounter for preprocedural laboratory examination: Secondary | ICD-10-CM | POA: Diagnosis not present

## 2016-06-01 DIAGNOSIS — Z01811 Encounter for preprocedural respiratory examination: Secondary | ICD-10-CM

## 2016-06-01 HISTORY — DX: Other allergic rhinitis: J30.89

## 2016-06-01 HISTORY — DX: Polyneuropathy, unspecified: G62.9

## 2016-06-01 LAB — URINALYSIS, COMPLETE (UACMP) WITH MICROSCOPIC
BILIRUBIN URINE: NEGATIVE
Glucose, UA: NEGATIVE mg/dL
HGB URINE DIPSTICK: NEGATIVE
KETONES UR: 5 mg/dL — AB
NITRITE: NEGATIVE
Protein, ur: 30 mg/dL — AB
Specific Gravity, Urine: 1.023 (ref 1.005–1.030)
pH: 6 (ref 5.0–8.0)

## 2016-06-01 LAB — BASIC METABOLIC PANEL
Anion gap: 7 (ref 5–15)
BUN: 15 mg/dL (ref 6–20)
CHLORIDE: 100 mmol/L — AB (ref 101–111)
CO2: 29 mmol/L (ref 22–32)
CREATININE: 0.76 mg/dL (ref 0.44–1.00)
Calcium: 9 mg/dL (ref 8.9–10.3)
GFR calc Af Amer: >60 mL/min (ref >60)
GFR calc non Af Amer: >60 mL/min (ref >60)
GLUCOSE: 140 mg/dL — AB (ref 65–99)
Potassium: 3.8 mmol/L (ref 3.5–5.1)
Sodium: 136 mmol/L (ref 135–145)

## 2016-06-01 LAB — CBC
HEMATOCRIT: 37.4 % (ref 35.0–47.0)
HEMOGLOBIN: 12.4 g/dL (ref 12.0–16.0)
MCH: 29.4 pg (ref 26.0–34.0)
MCHC: 33.3 g/dL (ref 32.0–36.0)
MCV: 88.4 fL (ref 80.0–100.0)
Platelets: 166 10*3/uL (ref 150–440)
RBC: 4.22 MIL/uL (ref 3.80–5.20)
RDW: 15.4 % — ABNORMAL HIGH (ref 11.5–14.5)
WBC: 7.6 10*3/uL (ref 3.6–11.0)

## 2016-06-01 LAB — TYPE AND SCREEN
ABO/RH(D): O NEG
Antibody Screen: NEGATIVE

## 2016-06-01 LAB — SURGICAL PCR SCREEN
MRSA, PCR: POSITIVE — AB
STAPHYLOCOCCUS AUREUS: POSITIVE — AB

## 2016-06-01 LAB — PROTIME-INR
INR: 1.14
Prothrombin Time: 14.7 seconds (ref 11.4–15.2)

## 2016-06-01 NOTE — Patient Instructions (Signed)
Your procedure is scheduled on: 06/14/16 Tues Report to Same Day Surgery 2nd floor medical mall [facility] Entrance-take elevator on left to 2nd floor.  Check in with surgery information desk.) To find out your arrival time please call [phone] between 1PM - 3PM on 06/13/16 Mon  Remember: Instructions that are not followed completely may result in serious medical risk, up to and including death, or upon the discretion of your surgeon and anesthesiologist your surgery may need to be rescheduled.    _x___ 1. Do not eat food or drink liquids after midnight. No gum chewing or                              hard candies.     __x__ 2. No Alcohol for 24 hours before or after surgery.   __x__3. No Smoking for 24 prior to surgery.   ____  4. Bring all medications with you on the day of surgery if instructed.    __x__ 5. Notify your doctor if there is any change in your medical condition     (cold, fever, infections).     Do not wear jewelry, make-up, hairpins, clips or nail polish.  Do not wear lotions, powders, or perfumes. You may wear deodorant.  Do not shave 48 hours prior to surgery. Men may shave face and neck.  Do not bring valuables to the hospital.    [facility] is not responsible for any belongings or valuables.               Contacts, dentures or bridgework may not be worn into surgery.  Leave your suitcase in the car. After surgery it may be brought to your room.  For patients admitted to the hospital, discharge time is determined by your                       treatment team.   Patients discharged the day of surgery will not be allowed to drive home.  You will need someone to drive you home and stay with you the night of your procedure.    Please read over the following fact sheets that you were given:   [facility] Preparing for Surgery and or MRSA Information   _x___ Take anti-hypertensive (unless it includes a diuretic), cardiac, seizure, asthma,     anti-reflux and  psychiatric medicines. These include:  1. budesonide-formoterol (SYMBICORT)   2.levothyroxine (SYNTHROID, LEVOTHROID)   3.RABEprazole (ACIPHEX  4.tiotropium (SPIRIVA HANDIHALER)   5.  6.  ____Fleets enema or Magnesium Citrate as directed.   _x___ Use CHG Soap or sage wipes as directed on instruction sheet   ____ Use inhalers on the day of surgery and bring to hospital day of surgery  __x__ Stop Metformin and Janumet 2 days prior to surgery.    ____ Take 1/2 of usual insulin dose the night before surgery and none on the morning     surgery.   _x___ Follow recommendations from Cardiologist, Pulmonologist or PCP regarding          stopping Aspirin, Coumadin, Pllavix ,Eliquis, Effient, or Pradaxa, and Pletal. Stop Aspirin 1 week before surgery  X____Stop Anti-inflammatories such as Advil, Aleve, Ibuprofen, Motrin, Naproxen, Naprosyn, Goodies powders or aspirin products. OK to take Tylenol and                          Celebrex.  _x___ Stop supplements until after surgery.  But may continue Vitamin D, Vitamin B,       and multivitamin.   ____ Bring C-Pap to the hospital.

## 2016-06-01 NOTE — Pre-Procedure Instructions (Addendum)
Called and faxed Samantha Henry, with Dr Poggi's office regarding positive MRSA and abnormal UA.

## 2016-06-14 ENCOUNTER — Encounter: Payer: Self-pay | Admitting: *Deleted

## 2016-06-14 ENCOUNTER — Encounter
Admission: RE | Disposition: A | Discharge status: Home / Self Care | Payer: Self-pay | Source: Ambulatory Visit | Attending: Surgery

## 2016-06-14 ENCOUNTER — Inpatient Hospital Stay: Payer: PPO

## 2016-06-14 ENCOUNTER — Inpatient Hospital Stay: Payer: PPO | Admitting: Anesthesiology

## 2016-06-14 ENCOUNTER — Inpatient Hospital Stay
Admission: RE | Admit: 2016-06-14 | Discharge: 2016-06-16 | DRG: 470 | Disposition: A | Discharge status: Home / Self Care | Payer: PPO | Source: Ambulatory Visit | Attending: Surgery | Admitting: Surgery

## 2016-06-14 DIAGNOSIS — D680 Von Willebrand's disease: Secondary | ICD-10-CM | POA: Diagnosis present

## 2016-06-14 DIAGNOSIS — M1990 Unspecified osteoarthritis, unspecified site: Secondary | ICD-10-CM | POA: Diagnosis not present

## 2016-06-14 DIAGNOSIS — M1712 Unilateral primary osteoarthritis, left knee: Secondary | ICD-10-CM | POA: Diagnosis not present

## 2016-06-14 DIAGNOSIS — Z8673 Personal history of transient ischemic attack (TIA), and cerebral infarction without residual deficits: Secondary | ICD-10-CM

## 2016-06-14 DIAGNOSIS — Z90710 Acquired absence of both cervix and uterus: Secondary | ICD-10-CM | POA: Diagnosis not present

## 2016-06-14 DIAGNOSIS — E1142 Type 2 diabetes mellitus with diabetic polyneuropathy: Secondary | ICD-10-CM | POA: Diagnosis not present

## 2016-06-14 DIAGNOSIS — G4730 Sleep apnea, unspecified: Secondary | ICD-10-CM | POA: Diagnosis present

## 2016-06-14 DIAGNOSIS — K219 Gastro-esophageal reflux disease without esophagitis: Secondary | ICD-10-CM | POA: Diagnosis not present

## 2016-06-14 DIAGNOSIS — Z8541 Personal history of malignant neoplasm of cervix uteri: Secondary | ICD-10-CM | POA: Diagnosis not present

## 2016-06-14 DIAGNOSIS — I10 Essential (primary) hypertension: Secondary | ICD-10-CM | POA: Diagnosis present

## 2016-06-14 DIAGNOSIS — Z96652 Presence of left artificial knee joint: Secondary | ICD-10-CM

## 2016-06-14 DIAGNOSIS — Z471 Aftercare following joint replacement surgery: Secondary | ICD-10-CM | POA: Diagnosis not present

## 2016-06-14 DIAGNOSIS — J449 Chronic obstructive pulmonary disease, unspecified: Secondary | ICD-10-CM | POA: Diagnosis present

## 2016-06-14 DIAGNOSIS — E119 Type 2 diabetes mellitus without complications: Secondary | ICD-10-CM | POA: Diagnosis not present

## 2016-06-14 DIAGNOSIS — Z96651 Presence of right artificial knee joint: Secondary | ICD-10-CM | POA: Diagnosis not present

## 2016-06-14 HISTORY — PX: JOINT REPLACEMENT: SHX530

## 2016-06-14 HISTORY — PX: TOTAL KNEE ARTHROPLASTY: SHX125

## 2016-06-14 LAB — GLUCOSE, CAPILLARY
GLUCOSE-CAPILLARY: 147 mg/dL — AB (ref 65–99)
GLUCOSE-CAPILLARY: 138 mg/dL — AB (ref 65–99)
Glucose-Capillary: 116 mg/dL — ABNORMAL HIGH (ref 65–99)
Glucose-Capillary: 170 mg/dL — ABNORMAL HIGH (ref 65–99)

## 2016-06-14 LAB — ABO/RH: ABO/RH(D): O NEG

## 2016-06-14 SURGERY — ARTHROPLASTY, KNEE, TOTAL
Anesthesia: General | Site: Knee | Laterality: Left | Wound class: Clean

## 2016-06-14 MED ORDER — VANCOMYCIN HCL 10 G IV SOLR
1250.0000 mg | Freq: Once | INTRAVENOUS | Status: DC
  Filled 2016-06-14: qty 1250

## 2016-06-14 MED ORDER — TRANEXAMIC ACID 1000 MG/10ML IV SOLN
INTRAVENOUS | Status: DC | PRN
  Administered 2016-06-14: 1000 mg via INTRAVENOUS

## 2016-06-14 MED ORDER — CHOLECALCIFEROL 10 MCG (400 UNIT) PO TABS
400.0000 [IU] | ORAL_TABLET | Freq: Every day | ORAL | Status: DC
  Administered 2016-06-15 – 2016-06-16 (×2): 400 [IU] via ORAL
  Filled 2016-06-14 (×2): qty 1

## 2016-06-14 MED ORDER — MOMETASONE FURO-FORMOTEROL FUM 200-5 MCG/ACT IN AERO
2.0000 | INHALATION_SPRAY | Freq: Two times a day (BID) | RESPIRATORY_TRACT | Status: DC
  Administered 2016-06-14 – 2016-06-16 (×5): 2 via RESPIRATORY_TRACT
  Filled 2016-06-14 (×2): qty 8.8

## 2016-06-14 MED ORDER — TIOTROPIUM BROMIDE MONOHYDRATE 18 MCG IN CAPS
18.0000 ug | ORAL_CAPSULE | Freq: Every day | RESPIRATORY_TRACT | Status: DC
  Administered 2016-06-14 – 2016-06-16 (×3): 18 ug via RESPIRATORY_TRACT
  Filled 2016-06-14: qty 5

## 2016-06-14 MED ORDER — ACETAMINOPHEN 10 MG/ML IV SOLN
INTRAVENOUS | Status: DC | PRN
  Administered 2016-06-14: 1000 mg via INTRAVENOUS

## 2016-06-14 MED ORDER — SODIUM CHLORIDE 0.9 % IV SOLN
INTRAVENOUS | Status: DC
  Administered 2016-06-14 (×2): via INTRAVENOUS

## 2016-06-14 MED ORDER — HYDROMORPHONE HCL 1 MG/ML IJ SOLN: 1.0000 mg | INTRAMUSCULAR | Status: DC | PRN

## 2016-06-14 MED ORDER — SODIUM CHLORIDE 0.9 % IV SOLN
INTRAVENOUS | Status: DC | PRN
  Administered 2016-06-14: 60 mL

## 2016-06-14 MED ORDER — OMEGA-3-ACID ETHYL ESTERS 1 G PO CAPS
1.0000 | ORAL_CAPSULE | Freq: Two times a day (BID) | ORAL | Status: DC
  Administered 2016-06-14 – 2016-06-16 (×4): 1 g via ORAL
  Filled 2016-06-14 (×4): qty 1

## 2016-06-14 MED ORDER — VANCOMYCIN HCL 10 G IV SOLR
1250.0000 mg | Freq: Once | INTRAVENOUS | Status: AC
  Administered 2016-06-14: 1250 mg via INTRAVENOUS

## 2016-06-14 MED ORDER — ENOXAPARIN SODIUM 40 MG/0.4ML ~~LOC~~ SOLN
40.0000 mg | SUBCUTANEOUS | Status: DC
  Administered 2016-06-15 – 2016-06-16 (×2): 40 mg via SUBCUTANEOUS
  Filled 2016-06-14 (×2): qty 0.4

## 2016-06-14 MED ORDER — ONDANSETRON HCL 4 MG/2ML IJ SOLN
INTRAMUSCULAR | Status: DC | PRN
  Administered 2016-06-14: 4 mg via INTRAVENOUS

## 2016-06-14 MED ORDER — BUPIVACAINE-EPINEPHRINE (PF) 0.5% -1:200000 IJ SOLN
INTRAMUSCULAR | Status: AC
  Filled 2016-06-14: qty 30

## 2016-06-14 MED ORDER — KETAMINE HCL 50 MG/ML IJ SOLN
INTRAMUSCULAR | Status: AC
  Filled 2016-06-14: qty 10

## 2016-06-14 MED ORDER — ADULT MULTIVITAMIN W/MINERALS CH
1.0000 | ORAL_TABLET | Freq: Every day | ORAL | Status: DC
  Administered 2016-06-16: 1 via ORAL
  Filled 2016-06-14: qty 1

## 2016-06-14 MED ORDER — ONDANSETRON HCL 4 MG PO TABS: 4.0000 mg | ORAL_TABLET | Freq: Four times a day (QID) | ORAL | Status: DC | PRN

## 2016-06-14 MED ORDER — MUPIROCIN 2 % EX OINT
1.0000 "application " | TOPICAL_OINTMENT | Freq: Two times a day (BID) | CUTANEOUS | Status: DC
  Administered 2016-06-14 – 2016-06-16 (×5): 1 via NASAL
  Filled 2016-06-14 (×2): qty 22

## 2016-06-14 MED ORDER — PROPOFOL 10 MG/ML IV BOLUS
INTRAVENOUS | Status: AC
  Filled 2016-06-14: qty 20

## 2016-06-14 MED ORDER — FENTANYL CITRATE (PF) 100 MCG/2ML IJ SOLN
INTRAMUSCULAR | Status: DC | PRN
Start: 2016-06-14 — End: 2016-06-14
  Administered 2016-06-14: 50 ug via INTRAVENOUS
  Administered 2016-06-14 (×2): 100 ug via INTRAVENOUS

## 2016-06-14 MED ORDER — OXYCODONE HCL 5 MG/5ML PO SOLN: 5.0000 mg | Freq: Once | ORAL | Status: DC | PRN

## 2016-06-14 MED ORDER — METOCLOPRAMIDE HCL 10 MG PO TABS: 5.0000 mg | ORAL_TABLET | Freq: Three times a day (TID) | ORAL | Status: DC | PRN

## 2016-06-14 MED ORDER — LOSARTAN POTASSIUM 50 MG PO TABS
50.0000 mg | ORAL_TABLET | Freq: Every day | ORAL | Status: DC
  Administered 2016-06-14 – 2016-06-16 (×3): 50 mg via ORAL
  Filled 2016-06-14 (×3): qty 1

## 2016-06-14 MED ORDER — ALBUTEROL SULFATE HFA 108 (90 BASE) MCG/ACT IN AERS: 2.0000 | INHALATION_SPRAY | Freq: Four times a day (QID) | RESPIRATORY_TRACT | Status: DC | PRN

## 2016-06-14 MED ORDER — LIDOCAINE HCL (PF) 2 % IJ SOLN
INTRAMUSCULAR | Status: AC
  Filled 2016-06-14: qty 2

## 2016-06-14 MED ORDER — NORTRIPTYLINE HCL 10 MG PO CAPS
10.0000 mg | ORAL_CAPSULE | Freq: Every day | ORAL | Status: DC
Start: 2016-06-14 — End: 2016-06-16
  Administered 2016-06-15: 10 mg via ORAL
  Filled 2016-06-14 (×2): qty 1

## 2016-06-14 MED ORDER — ACETAMINOPHEN 500 MG PO TABS
1000.0000 mg | ORAL_TABLET | Freq: Four times a day (QID) | ORAL | Status: AC
  Administered 2016-06-14 – 2016-06-15 (×3): 1000 mg via ORAL
  Filled 2016-06-14 (×3): qty 2

## 2016-06-14 MED ORDER — ROCURONIUM BROMIDE 50 MG/5ML IV SOLN
INTRAVENOUS | Status: AC
  Filled 2016-06-14: qty 1

## 2016-06-14 MED ORDER — GLYCOPYRROLATE 0.2 MG/ML IJ SOLN
INTRAMUSCULAR | Status: DC | PRN
  Administered 2016-06-14: 0.2 mg via INTRAVENOUS

## 2016-06-14 MED ORDER — NEOMYCIN-POLYMYXIN B GU 40-200000 IR SOLN
Status: AC
  Filled 2016-06-14: qty 20

## 2016-06-14 MED ORDER — METOCLOPRAMIDE HCL 5 MG/ML IJ SOLN: 5.0000 mg | Freq: Three times a day (TID) | INTRAMUSCULAR | Status: DC | PRN

## 2016-06-14 MED ORDER — MAGNESIUM HYDROXIDE 400 MG/5ML PO SUSP
30.0000 mL | Freq: Every day | ORAL | Status: DC | PRN
  Administered 2016-06-15 – 2016-06-16 (×2): 30 mL via ORAL
  Filled 2016-06-14 (×2): qty 30

## 2016-06-14 MED ORDER — ASPIRIN 81 MG PO CHEW
81.0000 mg | CHEWABLE_TABLET | Freq: Every day | ORAL | Status: DC
Start: 2016-06-14 — End: 2016-06-16
  Administered 2016-06-15 – 2016-06-16 (×2): 81 mg via ORAL
  Filled 2016-06-14 (×2): qty 1

## 2016-06-14 MED ORDER — POLYETHYLENE GLYCOL 3350 17 G PO PACK
17.0000 g | PACK | Freq: Every day | ORAL | Status: DC | PRN
  Filled 2016-06-14: qty 1

## 2016-06-14 MED ORDER — ACETAMINOPHEN 325 MG PO TABS: 650.0000 mg | ORAL_TABLET | Freq: Four times a day (QID) | ORAL | Status: DC | PRN

## 2016-06-14 MED ORDER — METFORMIN HCL ER 500 MG PO TB24
500.0000 mg | ORAL_TABLET | Freq: Every day | ORAL | Status: DC
  Administered 2016-06-15 – 2016-06-16 (×2): 500 mg via ORAL
  Filled 2016-06-14 (×2): qty 1

## 2016-06-14 MED ORDER — GLYCOPYRROLATE 0.2 MG/ML IJ SOLN
INTRAMUSCULAR | Status: AC
  Filled 2016-06-14: qty 1

## 2016-06-14 MED ORDER — FENTANYL CITRATE (PF) 250 MCG/5ML IJ SOLN
INTRAMUSCULAR | Status: AC
  Filled 2016-06-14: qty 5

## 2016-06-14 MED ORDER — OXYCODONE HCL 5 MG PO TABS
5.0000 mg | ORAL_TABLET | ORAL | Status: DC | PRN
  Administered 2016-06-15 – 2016-06-16 (×2): 10 mg via ORAL
  Filled 2016-06-14 (×2): qty 2

## 2016-06-14 MED ORDER — LEVOTHYROXINE SODIUM 125 MCG PO TABS
125.0000 ug | ORAL_TABLET | Freq: Every day | ORAL | Status: DC
  Administered 2016-06-15 – 2016-06-16 (×2): 125 ug via ORAL
  Filled 2016-06-14 (×2): qty 1

## 2016-06-14 MED ORDER — ALBUTEROL SULFATE (2.5 MG/3ML) 0.083% IN NEBU: 2.5000 mg | INHALATION_SOLUTION | Freq: Four times a day (QID) | RESPIRATORY_TRACT | Status: DC | PRN

## 2016-06-14 MED ORDER — ACETAMINOPHEN 10 MG/ML IV SOLN
INTRAVENOUS | Status: AC
  Filled 2016-06-14: qty 100

## 2016-06-14 MED ORDER — BUPIVACAINE LIPOSOME 1.3 % IJ SUSP
INTRAMUSCULAR | Status: AC
  Filled 2016-06-14: qty 20

## 2016-06-14 MED ORDER — FLEET ENEMA 7-19 GM/118ML RE ENEM
1.0000 | ENEMA | Freq: Once | RECTAL | Status: AC | PRN
  Administered 2016-06-16: 1 via RECTAL

## 2016-06-14 MED ORDER — TIOTROPIUM BROMIDE MONOHYDRATE 1.25 MCG/ACT IN AERS: 1.0000 | INHALATION_SPRAY | RESPIRATORY_TRACT | Status: DC

## 2016-06-14 MED ORDER — LIDOCAINE HCL (CARDIAC) 20 MG/ML IV SOLN
INTRAVENOUS | Status: DC | PRN
  Administered 2016-06-14 (×2): 100 mg via INTRAVENOUS

## 2016-06-14 MED ORDER — HYDROMORPHONE HCL 1 MG/ML IJ SOLN
INTRAMUSCULAR | Status: DC | PRN
  Administered 2016-06-14: 1 mg via INTRAVENOUS

## 2016-06-14 MED ORDER — CALCIUM CARBONATE ANTACID 500 MG PO CHEW: 1500.0000 mg | CHEWABLE_TABLET | Freq: Two times a day (BID) | ORAL | Status: DC | PRN

## 2016-06-14 MED ORDER — VANCOMYCIN HCL IN DEXTROSE 1-5 GM/200ML-% IV SOLN: 1000.0000 mg | Freq: Two times a day (BID) | INTRAVENOUS | Status: DC

## 2016-06-14 MED ORDER — VANCOMYCIN HCL 10 G IV SOLR
1250.0000 mg | Freq: Once | INTRAVENOUS | Status: AC
  Administered 2016-06-14: 1250 mg via INTRAVENOUS
  Filled 2016-06-14: qty 1250

## 2016-06-14 MED ORDER — DIPHENHYDRAMINE HCL 12.5 MG/5ML PO ELIX: 12.5000 mg | ORAL_SOLUTION | ORAL | Status: DC | PRN

## 2016-06-14 MED ORDER — MIDAZOLAM HCL 2 MG/2ML IJ SOLN
INTRAMUSCULAR | Status: DC | PRN
  Administered 2016-06-14: 2 mg via INTRAVENOUS

## 2016-06-14 MED ORDER — ROSUVASTATIN CALCIUM 20 MG PO TABS
20.0000 mg | ORAL_TABLET | Freq: Every day | ORAL | Status: DC
  Administered 2016-06-14 – 2016-06-15 (×2): 20 mg via ORAL
  Filled 2016-06-14 (×2): qty 1

## 2016-06-14 MED ORDER — ONDANSETRON HCL 4 MG/2ML IJ SOLN
INTRAMUSCULAR | Status: AC
  Filled 2016-06-14: qty 2

## 2016-06-14 MED ORDER — KETOROLAC TROMETHAMINE 15 MG/ML IJ SOLN
15.0000 mg | Freq: Four times a day (QID) | INTRAMUSCULAR | Status: AC
  Administered 2016-06-14 – 2016-06-15 (×3): 15 mg via INTRAVENOUS
  Filled 2016-06-14 (×3): qty 1

## 2016-06-14 MED ORDER — SUGAMMADEX SODIUM 200 MG/2ML IV SOLN
INTRAVENOUS | Status: DC | PRN
  Administered 2016-06-14: 65 mg via INTRAVENOUS

## 2016-06-14 MED ORDER — PROPOFOL 10 MG/ML IV BOLUS
INTRAVENOUS | Status: DC | PRN
  Administered 2016-06-14: 150 mg via INTRAVENOUS

## 2016-06-14 MED ORDER — VITAMIN B-12 100 MCG PO TABS
50.0000 ug | ORAL_TABLET | Freq: Every day | ORAL | Status: DC
  Administered 2016-06-15 – 2016-06-16 (×2): 50 ug via ORAL
  Filled 2016-06-14 (×2): qty 1

## 2016-06-14 MED ORDER — CHLORHEXIDINE GLUCONATE CLOTH 2 % EX PADS
6.0000 | MEDICATED_PAD | Freq: Every day | CUTANEOUS | Status: DC
  Administered 2016-06-15: 6 via TOPICAL

## 2016-06-14 MED ORDER — ONDANSETRON HCL 4 MG/2ML IJ SOLN
4.0000 mg | Freq: Four times a day (QID) | INTRAMUSCULAR | Status: DC | PRN
  Administered 2016-06-14: 4 mg via INTRAVENOUS
  Filled 2016-06-14: qty 2

## 2016-06-14 MED ORDER — SODIUM CHLORIDE 0.9 % IJ SOLN
INTRAMUSCULAR | Status: AC
  Filled 2016-06-14: qty 50

## 2016-06-14 MED ORDER — FENTANYL CITRATE (PF) 100 MCG/2ML IJ SOLN: 25.0000 ug | INTRAMUSCULAR | Status: DC | PRN

## 2016-06-14 MED ORDER — POTASSIUM CHLORIDE IN NACL 20-0.9 MEQ/L-% IV SOLN
INTRAVENOUS | Status: DC
  Administered 2016-06-14 – 2016-06-15 (×3): via INTRAVENOUS
  Filled 2016-06-14 (×7): qty 1000

## 2016-06-14 MED ORDER — OXYMETAZOLINE HCL 0.05 % NA SOLN
2.0000 | Freq: Every day | NASAL | Status: DC
  Administered 2016-06-14 – 2016-06-15 (×2): 2 via NASAL
  Filled 2016-06-14 (×2): qty 15

## 2016-06-14 MED ORDER — BISACODYL 10 MG RE SUPP
10.0000 mg | Freq: Every day | RECTAL | Status: DC | PRN
  Administered 2016-06-16: 10 mg via RECTAL
  Filled 2016-06-14: qty 1

## 2016-06-14 MED ORDER — ROCURONIUM BROMIDE 100 MG/10ML IV SOLN
INTRAVENOUS | Status: DC | PRN
  Administered 2016-06-14: 50 mg via INTRAVENOUS

## 2016-06-14 MED ORDER — NEOMYCIN-POLYMYXIN B GU 40-200000 IR SOLN
Status: DC | PRN
  Administered 2016-06-14: 14 mL

## 2016-06-14 MED ORDER — MIDAZOLAM HCL 2 MG/2ML IJ SOLN
INTRAMUSCULAR | Status: AC
  Filled 2016-06-14: qty 2

## 2016-06-14 MED ORDER — DULOXETINE HCL 40 MG PO CPEP: 40.0000 mg | ORAL_CAPSULE | Freq: Every day | ORAL | Status: DC

## 2016-06-14 MED ORDER — KETOROLAC TROMETHAMINE 30 MG/ML IJ SOLN
INTRAMUSCULAR | Status: DC | PRN
  Administered 2016-06-14: 30 mg via INTRAVENOUS

## 2016-06-14 MED ORDER — DIPHENHYDRAMINE HCL 25 MG PO CAPS: 25.0000 mg | ORAL_CAPSULE | Freq: Four times a day (QID) | ORAL | Status: DC | PRN

## 2016-06-14 MED ORDER — PHENYLEPHRINE HCL 10 MG/ML IJ SOLN
INTRAMUSCULAR | Status: AC
  Filled 2016-06-14: qty 1

## 2016-06-14 MED ORDER — PHENYLEPHRINE HCL 10 MG/ML IJ SOLN
INTRAMUSCULAR | Status: DC | PRN
  Administered 2016-06-14: 100 ug via INTRAVENOUS

## 2016-06-14 MED ORDER — DOCUSATE SODIUM 100 MG PO CAPS
100.0000 mg | ORAL_CAPSULE | Freq: Two times a day (BID) | ORAL | Status: DC
  Administered 2016-06-14 – 2016-06-16 (×4): 100 mg via ORAL
  Filled 2016-06-14 (×4): qty 1

## 2016-06-14 MED ORDER — HYDROMORPHONE HCL 1 MG/ML IJ SOLN
INTRAMUSCULAR | Status: AC
  Filled 2016-06-14: qty 1

## 2016-06-14 MED ORDER — SUGAMMADEX SODIUM 200 MG/2ML IV SOLN
INTRAVENOUS | Status: AC
  Filled 2016-06-14: qty 2

## 2016-06-14 MED ORDER — KETAMINE HCL 50 MG/ML IJ SOLN
INTRAMUSCULAR | Status: DC | PRN
  Administered 2016-06-14: 50 mg via INTRAMUSCULAR
  Administered 2016-06-14 (×2): 25 mg via INTRAMUSCULAR

## 2016-06-14 MED ORDER — ACETAMINOPHEN 650 MG RE SUPP: 650.0000 mg | Freq: Four times a day (QID) | RECTAL | Status: DC | PRN

## 2016-06-14 MED ORDER — OXYCODONE HCL 5 MG PO TABS: 5.0000 mg | ORAL_TABLET | Freq: Once | ORAL | Status: DC | PRN

## 2016-06-14 MED ORDER — PSEUDOEPHEDRINE-DM-GG 30-10-100 MG/5ML PO LIQD: 10.0000 mL | Freq: Four times a day (QID) | ORAL | Status: DC | PRN

## 2016-06-14 MED ORDER — TRANEXAMIC ACID 1000 MG/10ML IV SOLN
INTRAVENOUS | Status: AC
  Filled 2016-06-14: qty 10

## 2016-06-14 MED ORDER — BUPIVACAINE-EPINEPHRINE (PF) 0.5% -1:200000 IJ SOLN
INTRAMUSCULAR | Status: DC | PRN
  Administered 2016-06-14: 30 mL via PERINEURAL

## 2016-06-14 SURGICAL SUPPLY — 57 items
BANDAGE ELASTIC 6 CLIP ST LF (GAUZE/BANDAGES/DRESSINGS) ×3 IMPLANT
BLADE SAW SAG 25X90X1.19 (BLADE) ×3 IMPLANT
BLADE SURG SZ20 CARB STEEL (BLADE) ×3 IMPLANT
BONE CEMENT PALACOSE (Cement) ×6 IMPLANT
CANISTER SUCT 1200ML W/VALVE (MISCELLANEOUS) ×3 IMPLANT
CANISTER SUCT 3000ML PPV (MISCELLANEOUS) ×3 IMPLANT
CAPT KNEE PARTIAL 2 ×3 IMPLANT
CATH TRAY METER 16FR LF (MISCELLANEOUS) ×3 IMPLANT
CEMENT BONE PALACOSE (Cement) ×2 IMPLANT
CHLORAPREP W/TINT 26ML (MISCELLANEOUS) ×3 IMPLANT
COOLER POLAR GLACIER W/PUMP (MISCELLANEOUS) ×3 IMPLANT
COVER MAYO STAND STRL (DRAPES) ×3 IMPLANT
CUFF TOURN 24 STER (MISCELLANEOUS) IMPLANT
CUFF TOURN 30 STER DUAL PORT (MISCELLANEOUS) ×3 IMPLANT
DRAPE IMP U-DRAPE 54X76 (DRAPES) ×3 IMPLANT
DRAPE INCISE IOBAN 66X45 STRL (DRAPES) ×3 IMPLANT
DRAPE SHEET LG 3/4 BI-LAMINATE (DRAPES) ×3 IMPLANT
DRSG OPSITE POSTOP 4X10 (GAUZE/BANDAGES/DRESSINGS) ×3 IMPLANT
DRSG OPSITE POSTOP 4X12 (GAUZE/BANDAGES/DRESSINGS) IMPLANT
DRSG OPSITE POSTOP 4X14 (GAUZE/BANDAGES/DRESSINGS) IMPLANT
ELECT CAUTERY BLADE 6.4 (BLADE) ×3 IMPLANT
ELECT REM PT RETURN 9FT ADLT (ELECTROSURGICAL) ×3
ELECTRODE REM PT RTRN 9FT ADLT (ELECTROSURGICAL) ×1 IMPLANT
GLOVE BIO SURGEON STRL SZ7.5 (GLOVE) ×12 IMPLANT
GLOVE BIO SURGEON STRL SZ8 (GLOVE) ×12 IMPLANT
GLOVE BIOGEL PI IND STRL 8 (GLOVE) ×1 IMPLANT
GLOVE BIOGEL PI INDICATOR 8 (GLOVE) ×2
GLOVE INDICATOR 8.0 STRL GRN (GLOVE) ×3 IMPLANT
GOWN STRL REUS W/ TWL LRG LVL3 (GOWN DISPOSABLE) ×1 IMPLANT
GOWN STRL REUS W/ TWL XL LVL3 (GOWN DISPOSABLE) ×1 IMPLANT
GOWN STRL REUS W/TWL LRG LVL3 (GOWN DISPOSABLE) ×2
GOWN STRL REUS W/TWL XL LVL3 (GOWN DISPOSABLE) ×2
HOLDER FOLEY CATH W/STRAP (MISCELLANEOUS) ×3 IMPLANT
HOOD PEEL AWAY FLYTE STAYCOOL (MISCELLANEOUS) ×9 IMPLANT
IMMBOLIZER KNEE 19 BLUE UNIV (SOFTGOODS) ×3 IMPLANT
KIT RM TURNOVER STRD PROC AR (KITS) ×3 IMPLANT
NDL SAFETY 18GX1.5 (NEEDLE) ×3 IMPLANT
NEEDLE 18GX1X1/2 (RX/OR ONLY) (NEEDLE) ×3 IMPLANT
NEEDLE SPNL 20GX3.5 QUINCKE YW (NEEDLE) ×3 IMPLANT
NS IRRIG 1000ML POUR BTL (IV SOLUTION) ×3 IMPLANT
PACK TOTAL KNEE (MISCELLANEOUS) ×3 IMPLANT
PAD WRAPON POLAR KNEE (MISCELLANEOUS) ×1 IMPLANT
PULSAVAC PLUS IRRIG FAN TIP (DISPOSABLE) ×3
SOL .9 NS 3000ML IRR  AL (IV SOLUTION) ×2
SOL .9 NS 3000ML IRR UROMATIC (IV SOLUTION) ×1 IMPLANT
STAPLER SKIN PROX 35W (STAPLE) ×3 IMPLANT
SUCTION FRAZIER HANDLE 10FR (MISCELLANEOUS) ×2
SUCTION TUBE FRAZIER 10FR DISP (MISCELLANEOUS) ×1 IMPLANT
SUT VIC AB 0 CT1 36 (SUTURE) ×9 IMPLANT
SUT VIC AB 2-0 CT1 27 (SUTURE) ×8
SUT VIC AB 2-0 CT1 TAPERPNT 27 (SUTURE) ×4 IMPLANT
SYR 20CC LL (SYRINGE) ×3 IMPLANT
SYR 30ML LL (SYRINGE) ×9 IMPLANT
SYRINGE 10CC LL (SYRINGE) ×3 IMPLANT
SYSTEM VACUUM CEMENT MIXING (MISCELLANEOUS) ×3 IMPLANT
TIP FAN IRRIG PULSAVAC PLUS (DISPOSABLE) ×1 IMPLANT
WRAPON POLAR PAD KNEE (MISCELLANEOUS) ×3

## 2016-06-14 NOTE — Progress Notes (Signed)
Patient now more alert and sitting up eating dinner,.  Deri Fuelling, RN

## 2016-06-14 NOTE — Op Note (Signed)
06/14/2016  9:58 AM  Patient:   Samantha Henry  Pre-Op Diagnosis:   Degenerative joint disease, left knee.  Post-Op Diagnosis:   Same  Procedure:   Left TKA using all-cemented [employer] Vanguard system with a 65 mm PCR femur, a 71 mm tibial tray with a 10 mm AS E-poly insert, and a 34 x 8.5 mm all-poly 3-pegged domed patella.  Surgeon:   Pascal Lux, MD  Assistant:   Cameron Proud, PA-C   Anesthesia:   GET  Findings:   As above  Complications:   None  EBL:   15 cc  Fluids:   1400 cc crystalloid  UOP:   100 cc  TT:   90 minutes at 300 mmHg  Drains:   None  Closure:   Staples  Implants:   As above  Brief Clinical Note:   The patient is a 71 year old female with a long history of progressively worsening left knee pain. The patient's symptoms have progressed despite medications, activity modification, injections, etc. The patient's history and examination were consistent with advanced degenerative joint disease of the right knee confirmed by plain radiographs. The patient presents at this time for a left total knee arthroplasty.  Procedure:   The patient was brought into the operating room. After adequate spinal anesthesia was obtained, the patient was lain in the supine position. A Foley catheter was placed by the nurse before the right lower extremity was prepped with ChloraPrep solution and draped sterilely. Preoperative antibiotics were administered. After verifying the proper laterality with a surgical timeout, the limb was exsanguinated with an Esmarch and the tourniquet inflated to 300 mmHg. A standard anterior approach to the knee was made through an approximately 7 inch incision. The incision was carried down through the subcutaneous tissues to expose superficial retinaculum. This was split the length of the incision and the medial flap elevated sufficiently to expose the medial retinaculum. The medial retinaculum was incised, leaving a 3-4 mm cuff of tissue on the patella.  This was extended distally along the medial border of the patellar tendon and proximally through the medial third of the quadriceps tendon. A subtotal fat pad excision was performed before the soft tissues were elevated off the anteromedial and anterolateral aspects of the proximal tibia to the level of the collateral ligaments. The anterior portions of the medial and lateral menisci were removed, as was the anterior cruciate ligament. With the knee flexed to 90, the external tibial guide was positioned and the appropriate proximal tibial cut made. This piece was taken to the back table where it was measured and found to be optimally replicated by a 71 mm component.  Attention was directed to the distal femur. The intramedullary canal was accessed through a 3/8" drill hole. The intramedullary guide was inserted and position in order to obtain a neutral flexion gap. The intercondylar block was positioned with care taken to avoid notching the anterior cortex of the femur. The appropriate cut was made. Next, the distal cutting block was placed at 6 of valgus alignment. Using the 9 mm slot, the distal cut was made. The distal femur was measured and found to be optimally replicated by the 65 mm component. The 65 mm 4-in-1 cutting block was positioned and first the posterior, then the posterior chamfer, the anterior chamfer, femoral and intercondylar cuts were made. At this point, the posterior portions medial and lateral menisci were removed. A trial reduction was performed using the appropriate femoral and tibial components with the 10  mm insert. This demonstrated excellent stability to varus and valgus stressing both in flexion and extension while permitting full extension. Patella tracking was assessed and found to be excellent. Therefore, the tibial guide position was marked on the proximal tibia. The patella thickness was measured and found to be 20 mm. Therefore, the appropriate cut was made. The surfaces were  measured and found to be optimally replicated by the 34 mm component. The three peg holes were drilled in place before the trial button was inserted. Patella tracking was assessed and found to be excellent, passing the "no thumb test". The lug holes were drilled into the distal femur before the trial component was removed, leaving only the tibial tray. The keel was then created using the appropriate tower, reamer, and punch.  The bony surfaces were prepared for cementing by irrigating thoroughly with bacitracin saline solution. A bone plug was fashioned from some of the bone that had been removed previously and used to plug the distal femoral canal. In addition, 20 cc of Exparel diluted out to 60 cc with normal saline and 30 cc of 0.5% Sensorcaine were injected into the postero-medial and postero-lateral aspects of the knee, the medial and lateral gutter regions, and the peri-incisional tissues to help with postoperative analgesia. Meanwhile, the cement was being mixed on the back table. When it was ready, the tibial tray was cemented in first. The excess cement was removed using [employer] fast streamer. Next, the femoral component was impacted into place. Again, the excess cement was removed using [employer] fast streamer. The 10 mm trial insert was positioned and the knee brought into extension while the cement hardened. Finally, the patella was cemented into place and secured using the patellar clamp. Again, the excess cement was removed using [employer] fast streamer. Once the cement had hardened, the knee was placed through a range of motion with the findings as described above. As there appeared to be 2-3 mm of anterior-posterior displacement in the mid-flexion range, it was felt best to proceed with an anterior stabilized tibial insert.. Therefore, the trial insert was removed and, after verifying that no cement had been retained posteriorly, the permanent 10 mm anterior stabilized E-polyethylene insert was positioned and secured  using the appropriate key locking mechanism. Again the knee was placed through a range of motion with the findings as described above.  The wound was copiously irrigated with bacitracin saline solution using the jet lavage system before the quadriceps tendon and retinacular layer were reapproximated using #0 Vicryl interrupted sutures. The superficial retinacular layer was closed using 2-0 Vicryl interrupted sutures in several layers for the skin was closed using staples. A sterile honeycomb dressing was applied to the skin before the leg was wrapped with an Ace wrap to accommodate the polar pack. The patient was then awakened and returned to the recovery room in satisfactory condition after tolerating the procedure well.

## 2016-06-14 NOTE — Progress Notes (Signed)
Admission Patient easy to arouse but drowsy, answers a few questions and then falls back to sleep. No complaints of pain at this time. Vital signs stable. Patient on 2LO2. Surgical incision has honeycomb, ace wrap, and polar care. Heart and lung sounds normal. Daughters at the bedside.   Deri Fuelling, RN

## 2016-06-14 NOTE — Anesthesia Preprocedure Evaluation (Addendum)
Anesthesia Evaluation  Patient identified by MRN, date of birth, ID band Patient awake    Reviewed: Allergy & Precautions, H&P , NPO status , Patient's Chart, lab work & pertinent test results  History of Anesthesia Complications Negative for: history of anesthetic complications  Airway Mallampati: III  TM Distance: <3 FB Neck ROM: limited    Dental  (+) Poor Dentition, Chipped, Missing, Upper Dentures, Edentulous Upper   Pulmonary neg shortness of breath, sleep apnea , COPD, former smoker,    Pulmonary exam normal breath sounds clear to auscultation       Cardiovascular Exercise Tolerance: Good hypertension, (-) angina(-) Past MI and (-) DOE Normal cardiovascular exam Rhythm:regular Rate:Normal     Neuro/Psych TIA Neuromuscular disease negative psych ROS   GI/Hepatic Neg liver ROS, GERD  Controlled,  Endo/Other  diabetes, Type 2Hypothyroidism   Renal/GU      Musculoskeletal   Abdominal   Peds  Hematology negative hematology ROS (+)   Anesthesia Other Findings Past Medical History: No date: Arthritis     Comment: osteo No date: Cancer ([place])     Comment: CERVICAL CA No date: COPD (chronic obstructive pulmonary disease) (* No date: Diabetes mellitus without complication (HCC) No date: Diabetic peripheral neuropathy (HCC) No date: Environmental and seasonal allergies No date: GERD (gastroesophageal reflux disease) No date: History of cervical cancer No date: Hypercholesteremia No date: Hypertension No date: Hypothyroidism No date: Neuropathy No date: Sleep apnea No date: TIA (transient ischemic attack) No date: Von Willebrand's disease [facility])  Past Surgical History: 1971: ABDOMINAL HYSTERECTOMY     Comment: due to cervical cancer  No date: APPENDECTOMY 1997: BLADDER SURGERY     Comment: bladder tacking 01/21/2015: BREAST BIOPSY Left     Comment: NEG No date: CARDIAC CATHETERIZATION 2010: CARPAL TUNNEL  RELEASE No date: colonoscopy and upper endoscopy' 02/25/2016: COLONOSCOPY WITH PROPOFOL N/A     Comment: Procedure: COLONOSCOPY WITH PROPOFOL;                Surgeon: Lollie Sails, MD;  [facility]              ENDOSCOPY;  Service: Endoscopy;  Laterality:               N/A; 2010: TENDON RELEASE     Comment: right foot, Dr. Vickki Muff      Reproductive/Obstetrics negative OB ROS                             Anesthesia Physical Anesthesia Plan  ASA: III  Anesthesia Plan: General ETT   Post-op Pain Management:    Induction: Intravenous  PONV Risk Score and Plan: 4 or greater and Ondansetron, Dexamethasone, Propofol, Midazolam and Treatment may vary due to age  Airway Management Planned: Oral ETT  Additional Equipment:   Intra-op Plan:   Post-operative Plan: Extubation in OR  Informed Consent: I have reviewed the patients History and Physical, chart, labs and discussed the procedure including the risks, benefits and alternatives for the proposed anesthesia with the patient or authorized representative who has indicated his/her understanding and acceptance.   Dental Advisory Given  Plan Discussed with: Anesthesiologist, CRNA and Surgeon  Anesthesia Plan Comments: (Patient has VW (? Type 1 by verbal history) with no work up so plan for GA over spinal  Patient consented for risks of anesthesia including but not limited to:  - adverse reactions to medications - damage to teeth, lips or other oral  mucosa - sore throat or hoarseness - Damage to heart, brain, lungs or loss of life  Patient voiced understanding.)        Anesthesia Quick Evaluation

## 2016-06-14 NOTE — H&P (Signed)
Paper H&P to be scanned into permanent record. H&P reviewed and patient re-examined. No changes. 

## 2016-06-14 NOTE — Anesthesia Procedure Notes (Signed)
Procedure Name: Intubation Date/Time: 06/14/2016 7:25 AM Performed by: Rosaria Ferries, Krystofer Hevener Pre-anesthesia Checklist: Patient identified, Emergency Drugs available, Suction available and Patient being monitored Patient Re-evaluated:Patient Re-evaluated prior to inductionOxygen Delivery Method: Circle system utilized Preoxygenation: Pre-oxygenation with 100% oxygen Intubation Type: IV induction Laryngoscope Size: Mac and 3 Grade View: Grade I Tube type: Oral Tube size: 7.0 mm Number of attempts: 1 Placement Confirmation: ETT inserted through vocal cords under direct vision,  positive ETCO2 and breath sounds checked- equal and bilateral Secured at: 21 cm Tube secured with: Tape Dental Injury: Teeth and Oropharynx as per pre-operative assessment

## 2016-06-14 NOTE — Progress Notes (Signed)
PT Cancellation Note  Patient Details Name: Samantha Henry MRN: [id number] DOB: Oct 24, 1945   Cancelled Treatment:    Reason Eval/Treat Not Completed: Fatigue/lethargy limiting ability to participate;Patient's level of consciousness;Other (comment): PT orders received and chart reviewed. PT attempted evaluation this afternoon, but pt was unable to participate due to extremely lethargy and nausea/vomiting. Nurse was called to give pt medication for these symptoms. PT will re-attempt evaluation tomorrow AM.   Donaciano Eva, PT, SPT Marni Griffon 06/14/2016, 4:14 PM

## 2016-06-14 NOTE — Transfer of Care (Signed)
Immediate Anesthesia Transfer of Care Note  Patient: Samantha Henry  Procedure(s) Performed: Procedure(s): TOTAL KNEE ARTHROPLASTY (Left)  Patient Location: PACU  Anesthesia Type:General  Level of Consciousness: sedated  Airway & Oxygen Therapy: Patient connected to nasal cannula oxygen  Post-op Assessment: Report given to RN and Post -op Vital signs reviewed and stable  Post vital signs: Reviewed and stable  Last Vitals:  Vitals:   06/14/16 0615 06/14/16 1000  BP: (!) 138/56 105/71  Pulse: 83 97  Resp: 14 14  Temp: 36.6 C 36.5 C    Last Pain:  Vitals:   06/14/16 0615  TempSrc: Oral  PainSc: 3          Complications: No apparent anesthesia complications

## 2016-06-14 NOTE — NC FL2 (Signed)
[place] LEVEL OF CARE SCREENING TOOL     IDENTIFICATION  Patient Name: Samantha Henry Birthdate: 1945/05/19 Sex: female Admission Date (Current Location): 06/14/2016  [place] and [place] Number:  Engineering geologist and Address:  [facility], [address] [place], [place] [address]      Provider Number: [id number]  Attending Physician Name and Address:  Corky Mull, MD  Relative Name and Phone Number:       Current Level of Care: Hospital Recommended Level of Care: [place] Prior Approval Number:    Date Approved/Denied:   PASRR Number:  [id number])  Discharge Plan: SNF    Current Diagnoses: Patient Active Problem List   Diagnosis Date Noted  . Status post total knee replacement using cement, left 06/14/2016  . Polyneuropathy 02/26/2015  . Osteoarthritis 08/02/2014  . Allergic rhinitis 07/29/2014  . COPD (chronic obstructive pulmonary disease) ([place]) 07/25/2014  . Diabetes ([place]) 07/25/2014  . Diverticulosis of colon 07/25/2014  . Edema 07/25/2014  . GERD (gastroesophageal reflux disease) 07/25/2014  . Hypercholesteremia 07/25/2014  . Hypertension 07/25/2014  . Hypothyroid 07/25/2014  . Cramps of lower extremity 07/25/2014  . Microalbuminuria 07/25/2014  . Nail dystrophy 07/25/2014  . Osteoarthritis of leg 07/25/2014  . Restless legs syndrome 07/25/2014  . TIA (transient ischemic attack) 07/25/2014  . Von Willebrand's disease ([place]) 07/25/2014  . OSA (obstructive sleep apnea) 10/31/2013  . History of adenomatous polyp of colon 08/26/2005    Orientation RESPIRATION BLADDER Height & Weight     Self, Time, Situation, Place  O2 (2 Liters Oxygen ) Continent Weight: 227 lb 1.2 oz (103 kg) Height:  5\' 6"  (167.6 cm)  BEHAVIORAL SYMPTOMS/MOOD NEUROLOGICAL BOWEL NUTRITION STATUS   (none )  (none) Continent Diet (Diet: Clear Liquid to be advanced. )  AMBULATORY STATUS COMMUNICATION OF NEEDS Skin    Extensive Assist Verbally Surgical wounds (Incision: Left Knee )                       Personal Care Assistance Level of Assistance  Bathing, Feeding, Dressing Bathing Assistance: Limited assistance Feeding assistance: Independent Dressing Assistance: Limited assistance     Functional Limitations Info  Sight, Hearing, Speech Sight Info: Adequate Hearing Info: Adequate Speech Info: Adequate    SPECIAL CARE FACTORS FREQUENCY  PT (By licensed PT), OT (By licensed OT)     PT Frequency:  (5) OT Frequency:  (5)            Contractures      Additional Factors Info  Code Status, Allergies, Isolation Precautions Code Status Info:  (Full Code. ) Allergies Info:  (Aspirin, Lyrica Pregabalin, Tetracycline)     Isolation Precautions Info:  (MRSA Nasal swab. )     Current Medications (06/14/2016):  This is the current hospital active medication list Current Facility-Administered Medications  Medication Dose Route Frequency Provider Last Rate Last Dose  . 0.9 % NaCl with KCl 20 mEq/ L  infusion   Intravenous Continuous Poggi, Marshall Cork, MD 100 mL/hr at 06/14/16 1301    . acetaminophen (TYLENOL) tablet 650 mg  650 mg Oral Q6H PRN Poggi, Marshall Cork, MD       Or  . acetaminophen (TYLENOL) suppository 650 mg  650 mg Rectal Q6H PRN Poggi, Marshall Cork, MD      . acetaminophen (TYLENOL) tablet 1,000 mg  1,000 mg Oral Q6H Poggi, Marshall Cork, MD      . albuterol (PROVENTIL) (2.5  MG/3ML) 0.083% nebulizer solution 2.5 mg  2.5 mg Nebulization Q6H PRN Poggi, Marshall Cork, MD      . aspirin chewable tablet 81 mg  81 mg Oral Daily Poggi, Marshall Cork, MD      . bisacodyl (DULCOLAX) suppository 10 mg  10 mg Rectal Daily PRN Poggi, Marshall Cork, MD      . calcium carbonate (TUMS - dosed in mg elemental calcium) chewable tablet 1,500 mg  1,500 mg Oral BID PRN Poggi, Marshall Cork, MD      . Derrill Memo ON 06/15/2016] Chlorhexidine Gluconate Cloth 2 % PADS 6 each  6 each Topical Q0600 Poggi, Marshall Cork, MD      . cholecalciferol (VITAMIN D)  tablet 400 Units  400 Units Oral Daily Poggi, Marshall Cork, MD      . diphenhydrAMINE (BENADRYL) 12.5 MG/5ML elixir 12.5-25 mg  12.5-25 mg Oral Q4H PRN Poggi, Marshall Cork, MD      . diphenhydrAMINE (BENADRYL) capsule 25 mg  25 mg Oral Q6H PRN Poggi, Marshall Cork, MD      . docusate sodium (COLACE) capsule 100 mg  100 mg Oral BID Poggi, Marshall Cork, MD      . Derrill Memo ON 06/15/2016] enoxaparin (LOVENOX) injection 40 mg  40 mg Subcutaneous Q24H Poggi, Marshall Cork, MD      . HYDROmorphone (DILAUDID) injection 1-2 mg  1-2 mg Intravenous Q2H PRN Poggi, Marshall Cork, MD      . ketorolac (TORADOL) 15 MG/ML injection 15 mg  15 mg Intravenous Q6H Poggi, Marshall Cork, MD   15 mg at 06/14/16 1301  . levothyroxine (SYNTHROID, LEVOTHROID) tablet 125 mcg  125 mcg Oral QAC breakfast Poggi, Marshall Cork, MD      . losartan (COZAAR) tablet 50 mg  50 mg Oral Daily Poggi, Marshall Cork, MD      . magnesium hydroxide (MILK OF MAGNESIA) suspension 30 mL  30 mL Oral Daily PRN Poggi, Marshall Cork, MD      . Derrill Memo ON 06/15/2016] metFORMIN (GLUCOPHAGE-XR) 24 hr tablet 500 mg  500 mg Oral Q breakfast Poggi, Marshall Cork, MD      . metoCLOPramide (REGLAN) tablet 5-10 mg  5-10 mg Oral Q8H PRN Poggi, Marshall Cork, MD       Or  . metoCLOPramide (REGLAN) injection 5-10 mg  5-10 mg Intravenous Q8H PRN Poggi, Marshall Cork, MD      . mometasone-formoterol (DULERA) 200-5 MCG/ACT inhaler 2 puff  2 puff Inhalation BID Poggi, Marshall Cork, MD      . multivitamin with minerals tablet 1 tablet  1 tablet Oral Daily Poggi, Marshall Cork, MD      . mupirocin ointment (BACTROBAN) 2 % 1 application  1 application Nasal BID Poggi, Marshall Cork, MD      . nortriptyline (PAMELOR) capsule 10 mg  10 mg Oral QHS Poggi, Marshall Cork, MD      . omega-3 acid ethyl esters (LOVAZA) capsule 1 g  1 capsule Oral BID Poggi, Marshall Cork, MD      . ondansetron (ZOFRAN) tablet 4 mg  4 mg Oral Q6H PRN Poggi, Marshall Cork, MD       Or  . ondansetron (ZOFRAN) injection 4 mg  4 mg Intravenous Q6H PRN Poggi, Marshall Cork, MD      . oxyCODONE (Oxy IR/ROXICODONE) immediate release  tablet 5-10 mg  5-10 mg Oral Q3H PRN Poggi, Marshall Cork, MD      . oxymetazoline (AFRIN) 0.05 % nasal spray 2 spray  2 spray  Each Nare QHS Poggi, Marshall Cork, MD      . polyethylene glycol (MIRALAX / GLYCOLAX) packet 17 g  17 g Oral Daily PRN Poggi, Marshall Cork, MD      . rosuvastatin (CRESTOR) tablet 20 mg  20 mg Oral q1800 Poggi, Marshall Cork, MD      . sodium phosphate (FLEET) 7-19 GM/118ML enema 1 enema  1 enema Rectal Once PRN Poggi, Marshall Cork, MD      . tiotropium [facility]) inhalation capsule 18 mcg  18 mcg Inhalation Daily Poggi, Marshall Cork, MD      . vancomycin (VANCOCIN) 1,250 mg in sodium chloride 0.9 % 250 mL IVPB  1,250 mg Intravenous Once Poggi, Marshall Cork, MD      . vitamin B-12 (CYANOCOBALAMIN) tablet 50 mcg  50 mcg Oral Daily Poggi, Marshall Cork, MD         Discharge Medications: Please see discharge summary for a list of discharge medications.  Relevant Imaging Results:  Relevant Lab Results:   Additional Information  (SSN: [id number])  Kajuan Guyton, Veronia Beets, LCSW

## 2016-06-14 NOTE — Anesthesia Postprocedure Evaluation (Signed)
Anesthesia Post Note  Patient: Samantha Henry  Procedure(s) Performed: Procedure(s) (LRB): TOTAL KNEE ARTHROPLASTY (Left)  Patient location during evaluation: PACU Anesthesia Type: General Level of consciousness: awake and alert Pain management: pain level controlled Vital Signs Assessment: post-procedure vital signs reviewed and stable Respiratory status: spontaneous breathing, nonlabored ventilation, respiratory function stable and patient connected to nasal cannula oxygen Cardiovascular status: blood pressure returned to baseline and stable Postop Assessment: no signs of nausea or vomiting Anesthetic complications: no     Last Vitals:  Vitals:   06/14/16 1145 06/14/16 1158  BP: (!) 126/59   Pulse: 88 89  Resp: (!) 6 10  Temp: 36.3 C     Last Pain:  Vitals:   06/14/16 1145  TempSrc:   PainSc: 0-No pain                 Precious Haws Nicola Heinemann

## 2016-06-14 NOTE — Anesthesia Post-op Follow-up Note (Cosign Needed)
Anesthesia QCDR form completed.        

## 2016-06-15 LAB — CBC WITH DIFFERENTIAL/PLATELET
Basophils Absolute: 0 10*3/uL (ref 0–0.1)
Basophils Relative: 0 %
EOS PCT: 1 %
Eosinophils Absolute: 0.1 10*3/uL (ref 0–0.7)
HEMATOCRIT: 31.1 % — AB (ref 35.0–47.0)
Hemoglobin: 10.3 g/dL — ABNORMAL LOW (ref 12.0–16.0)
LYMPHS PCT: 19 %
Lymphs Abs: 1.8 10*3/uL (ref 1.0–3.6)
MCH: 29.9 pg (ref 26.0–34.0)
MCHC: 33.1 g/dL (ref 32.0–36.0)
MCV: 90.3 fL (ref 80.0–100.0)
MONOS PCT: 11 %
Monocytes Absolute: 1 10*3/uL — ABNORMAL HIGH (ref 0.2–0.9)
NEUTROS ABS: 6.2 10*3/uL (ref 1.4–6.5)
Neutrophils Relative %: 69 %
Platelets: 136 10*3/uL — ABNORMAL LOW (ref 150–440)
RBC: 3.44 MIL/uL — ABNORMAL LOW (ref 3.80–5.20)
RDW: 15 % — ABNORMAL HIGH (ref 11.5–14.5)
WBC: 9.1 10*3/uL (ref 3.6–11.0)

## 2016-06-15 LAB — GLUCOSE, CAPILLARY
GLUCOSE-CAPILLARY: 122 mg/dL — AB (ref 65–99)
GLUCOSE-CAPILLARY: 159 mg/dL — AB (ref 65–99)
Glucose-Capillary: 147 mg/dL — ABNORMAL HIGH (ref 65–99)
Glucose-Capillary: 180 mg/dL — ABNORMAL HIGH (ref 65–99)

## 2016-06-15 LAB — BASIC METABOLIC PANEL
Anion gap: 5 (ref 5–15)
BUN: 12 mg/dL (ref 6–20)
CALCIUM: 8.2 mg/dL — AB (ref 8.9–10.3)
CO2: 28 mmol/L (ref 22–32)
CREATININE: 0.45 mg/dL (ref 0.44–1.00)
Chloride: 105 mmol/L (ref 101–111)
GFR calc Af Amer: >60 mL/min (ref >60)
GFR calc non Af Amer: >60 mL/min (ref >60)
GLUCOSE: 151 mg/dL — AB (ref 65–99)
Potassium: 4.6 mmol/L (ref 3.5–5.1)
Sodium: 138 mmol/L (ref 135–145)

## 2016-06-15 LAB — URINE CULTURE: Culture: NO GROWTH

## 2016-06-15 NOTE — Care Management Note (Addendum)
Case Management Note  Patient Details  Name: Samantha Henry MRN: [id number] Date of Birth: 23-Oct-1945  Subjective/Objective:  POD # 1 left TKA. Met with patient and her daughter [name])  at bedside to discuss discharge planning. Patient will be staying with her daughter at discharge. Address: [address] [place], [place] [address] [phone]). She will need a walker. Ordered from [facility]. Offered choice of home health agencies. Referral to [facility].  Pharmacy: [facility] [place] [phone] . Called Lovenox 40 mg # 14 no refills. PCP is Dr. Caryn Section                Action/Plan: Made daughter aware there may be a copay for PT services. She is agreeable to proceed.   Expected Discharge Date:                  Expected Discharge Plan:  [place]  In-House Referral:     Discharge planning Services  CM Consult  Post Acute Care Choice:  Durable Medical Equipment, Home Health Choice offered to:  Patient, Adult Children  DME Arranged:  Walker rolling DME Agency:  [place]:  PT [place]:  [facility]  Status of Service:  In process, will continue to follow  If discussed at Long Length of Stay Meetings, dates discussed:    Additional Comments:  Jolly Mango, RN 06/15/2016, 11:13 AM

## 2016-06-15 NOTE — Progress Notes (Signed)
Physical Therapy Evaluation Patient Details Name: Samantha Henry MRN: [id number] DOB: 1945-07-05 Today's Date: 06/15/2016   History of Present Illness  Pt is a 71 y/o female who received a L TKA yesterday (06/14/2016). Pt was previously independent on all her ADL's and ambulated without an AD. Pt is on contact precautions secondary to MRSA. PMH includes polyneuropathy, OA, COPD, DM, diverticulitis, edema, GERD, high cholesterol, HTN, hypothyroid, TIA, Von Willebrand's syndrome, OSA, and colon polyp.     Clinical Impression  Pt is a pleasant 71 year old F who was admitted for a L TKA. Pt performs bed mobility, transfers, and ambulation with min guard to min assist. Pt demonstrates deficits with L LE strength and AROM. Pt ambulated 25ft with RW on room air. SaO2 on room air at rest = 90%. SaO2 on room air while ambulating = 88-90%. Pt left on room air, RN notified. Pt demonstrates ability to perform 10 SLRs with independence, therefore does not require KI for mobility. Would benefit from skilled PT to address above deficits and promote optimal return to PLOF. PT currently recommends dc to daughter's home for 2-3 days prior to pt returning home with home health PT.     Follow Up Recommendations Home health PT (dc to daughter's home prior to return to pt home (alone))    Equipment Recommendations  Rolling walker with 5" wheels    Recommendations for Other Services       Precautions / Restrictions Precautions Precautions: Fall;Knee Precaution Booklet Issued: No Required Braces or Orthoses:  (KI not needed - Pt performed 10 SLR's.) Restrictions Weight Bearing Restrictions: Yes LLE Weight Bearing: Weight bearing as tolerated      Mobility  Bed Mobility Overal bed mobility: Needs Assistance Bed Mobility: Supine to Sit     Supine to sit: Min guard;Min assist     General bed mobility comments: Pt required verbal cueing for proper sequencing. PT assisted pt's L LE from supine to EOB. Pt  complained of dizziness once at EOB, which subsided in <83min. O2 sats on room air at 89% at EOB. No complaints of SOB.  Transfers Overall transfer level: Needs assistance Equipment used: Rolling walker (2 wheeled) Transfers: Sit to/from Stand Sit to Stand: Min assist;Min guard         General transfer comment: Verbal cueing for proper sequencing. Safe techniques performed. Pt denies any dizziness upon standing.   Ambulation/Gait Ambulation/Gait assistance: Min guard;Min assist Ambulation Distance (Feet): 40 Feet Assistive device: Rolling walker (2 wheeled) Gait Pattern/deviations: Step-to pattern;Decreased step length - left;Decreased step length - right     General Gait Details: Pt ambulated on room air with RW. O2 sats remained 88-90% upon exertion. Pt required verbal cueing for proper sequencing. No chair follow.  Stairs            Wheelchair Mobility    Modified Rankin (Stroke Patients Only)       Balance Overall balance assessment: Needs assistance     Sitting balance - Comments: Pt able to sit EOB with feet supported with supervision. Pt able to remove UE support without LOB. Mild dizziness at EOB, which subsided quickly.   Standing balance support: Bilateral upper extremity supported (both hands on RW)   Standing balance comment: No LOB noted when standing or when ambulating. Pt maintained B UE support on RW while standing.  Pertinent Vitals/Pain Pain Assessment: 0-10 Pain Score: 3  Pain Location: L knee Pain Descriptors / Indicators: Aching Pain Intervention(s): Limited activity within patient's tolerance;Monitored during session;Premedicated before session;Repositioned;Ice applied    Home Living Family/patient expects to be discharged to:: Private residence (daughter's home for 1st few days prior to return to pt home) Living Arrangements: Alone Available Help at Discharge: Family (daughter ) Type of Home: Mobile  home Home Access: Stairs to enter Entrance Stairs-Rails: Can reach both Entrance Stairs-Number of Steps: 6 Home Layout: One level Home Equipment: Environmental consultant - standard (purchased from [employer] 60yrs ago, pt not using previously) Additional Comments: Pt planning to dc to daughter's home, which is 1 story and only has 1 step to enter/exit.    Prior Function Level of Independence: Independent               Hand Dominance        Extremity/Trunk Assessment   Upper Extremity Assessment Upper Extremity Assessment:  (did not assess)    Lower Extremity Assessment Lower Extremity Assessment: LLE deficits/detail LLE Deficits / Details: Pt's light touch was equal and normal B. Pt's R LE strength grossly 5/5 for PF/DF and 4+/5 for knee flexors/extensors.       Communication   Communication: No difficulties  Cognition Arousal/Alertness: Awake/alert Behavior During Therapy: WFL for tasks assessed/performed Overall Cognitive Status: Within Functional Limits for tasks assessed                                 General Comments: Pt was able to fully participate in today's session.      General Comments      Exercises Total Joint Exercises Goniometric ROM: L knee AROM: 0-80 Other Exercises Other Exercises: Supine ther-ex x10 on L LE included: ankle pumps B, quad sets, SLR, hip abd/add. Ther-ex in recliner x10 L LE included heel slides. Cueing for proper sequencing. Performed on room air, O2 sats remained between 88-90% with no SOB present.   Assessment/Plan    PT Assessment Patient needs continued PT services  PT Problem List Decreased strength;Decreased range of motion;Decreased mobility;Pain       PT Treatment Interventions Stair training;Gait training;Functional mobility training;Therapeutic exercise;Therapeutic activities    PT Goals (Current goals can be found in the Care Plan section)  Acute Rehab PT Goals Patient Stated Goal: to go home PT Goal Formulation:  With patient Time For Goal Achievement: 06/29/16 Potential to Achieve Goals: Good Additional Goals Additional Goal #1: Will be able to perform all bed mobility/transfers with supervision and RW to be safe within her home.    Frequency BID   Barriers to discharge        Co-evaluation               AM-PAC PT "6 Clicks" Daily Activity  Outcome Measure Difficulty turning over in bed (including adjusting bedclothes, sheets and blankets)?: A Little Difficulty moving from lying on back to sitting on the side of the bed? : Total Difficulty sitting down on and standing up from a chair with arms (e.g., wheelchair, bedside commode, etc,.)?: Total Help needed moving to and from a bed to chair (including a wheelchair)?: A Little Help needed walking in hospital room?: A Little Help needed climbing 3-5 steps with a railing? : A Lot 6 Click Score: 13    End of Session Equipment Utilized During Treatment: Gait belt Activity Tolerance: Patient tolerated treatment well Patient left: with chair  alarm set;with call bell/phone within reach;with SCD's reapplied;with family/visitor present Nurse Communication: Mobility status;Other (comment) (Pt left on room air.) PT Visit Diagnosis: Other abnormalities of gait and mobility (R26.89);Muscle weakness (generalized) (M62.81);Pain Pain - Right/Left: Left Pain - part of body: Knee    Time: 0832-0919 PT Time Calculation (min) (ACUTE ONLY): 47 min   Charges:   PT Evaluation $PT Eval Low Complexity: 1 Procedure PT Treatments $Therapeutic Exercise: 8-22 mins   PT G Codes:        Donaciano Eva, PT, SPT  Marni Griffon 06/15/2016, 12:24 PM

## 2016-06-15 NOTE — Care Management (Signed)
Lovenox $3.35 per [place] [phone]. Patient notified and agrees.

## 2016-06-15 NOTE — Progress Notes (Signed)
Subjective: 1 Day Post-Op Procedure(s) (LRB): TOTAL KNEE ARTHROPLASTY (Left) Patient reports pain as mild.   Patient is well, and has had no acute complaints or problems PT and care management to assist with patient discharge. Negative for chest pain and shortness of breath Fever: no Gastrointestinal:Positive for nausea and vomiting last night, denies any symptoms this AM.  Objective: Vital signs in last 24 hours: Temp:  [97.4 F (36.3 C)-98.9 F (37.2 C)] 98.1 F (36.7 C) (06/06 0732) Pulse Rate:  [75-97] 79 (06/06 0732) Resp:  [5-19] 16 (06/06 0732) BP: (105-145)/(42-71) 131/48 (06/06 0732) SpO2:  [93 %-99 %] 98 % (06/06 0732) Weight:  [103 kg (227 lb 1.2 oz)] 103 kg (227 lb 1.2 oz) (06/05 1217)  Intake/Output from previous day:  Intake/Output Summary (Last 24 hours) at 06/15/16 0738 Last data filed at 06/15/16 0557  Gross per 24 hour  Intake          3778.33 ml  Output              965 ml  Net          2813.33 ml    Intake/Output this shift: No intake/output data recorded.  Labs:  Recent Labs  06/15/16 0529  HGB 10.3*    Recent Labs  06/15/16 0529  WBC 9.1  RBC 3.44*  HCT 31.1*  PLT 136*    Recent Labs  06/15/16 0529  NA 138  K 4.6  CL 105  CO2 28  BUN 12  CREATININE 0.45  GLUCOSE 151*  CALCIUM 8.2*   No results for input(s): LABPT, INR in the last 72 hours.   EXAM General - Patient is Alert, Appropriate and Oriented Extremity - Sensation intact distally Intact pulses distally Dorsiflexion/Plantar flexion intact Incision: scant drainage No cellulitis present Dressing/Incision - blood tinged drainage at the distal aspect of the incision. Motor Function - intact, moving foot and toes well on exam.   Abdomen soft with normal BS, no tympany present this AM.  Past Medical History:  Diagnosis Date  . Arthritis    osteo  . Cancer ([place])    CERVICAL CA  . COPD (chronic obstructive pulmonary disease) ([place])   . Diabetes mellitus without  complication ([place])   . Diabetic peripheral neuropathy ([place])   . Environmental and seasonal allergies   . GERD (gastroesophageal reflux disease)   . History of cervical cancer   . Hypercholesteremia   . Hypertension   . Hypothyroidism   . Neuropathy   . Sleep apnea   . TIA (transient ischemic attack)   . Von Willebrand's disease ([place])     Assessment/Plan: 1 Day Post-Op Procedure(s) (LRB): TOTAL KNEE ARTHROPLASTY (Left) Active Problems:   Status post total knee replacement using cement, left  Estimated body mass index is 36.65 kg/m as calculated from the following:   Height as of this encounter: 5\' 6"  (1.676 m).   Weight as of this encounter: 103 kg (227 lb 1.2 oz). Advance diet Up with therapy D/C IV fluids when tolerating po intake. Wean off of O2 today.  Labs reviewed, Hg stable at 10.3, platelets 136 this AM.  CBC and BMP ordered for tomorrow morning. Pt reports mild gas but not frequent, continue to monitor for ileus. Did not work with PT yesterday due to fatigue, up today for discharge planning. Plan will be for evaluation tomorrow for possible discharge home/SNF.  DVT Prophylaxis - Lovenox, Foot Pumps and TED hose Weight-Bearing as tolerated to left leg  J. Cameron Proud,  PA-C [facility] 06/15/2016, 7:38 AM

## 2016-06-15 NOTE — Progress Notes (Signed)
Physical Therapy Treatment Patient Details Name: Samantha Henry MRN: [id number] DOB: 1945-09-12 Today's Date: 06/15/2016    History of Present Illness Pt is a 71 y/o female who received a L TKA yesterday (06/14/2016). Pt was previously independent on all her ADL's and ambulated without an AD. Pt is on contact precautions secondary to MRSA. PMH includes polyneuropathy, OA, COPD, DM, diverticulitis, edema, GERD, high cholesterol, HTN, hypothyroid, TIA, Von Willebrand's syndrome, OSA, and colon polyp.     PT Comments    Pt is progressing well toward her goals of ambulation, transfer, and bed mobility. Pt ambulated 210ft with RW and min guard on room air without a chair follow. Pt maintained a step through, continuous gait pattern roughly 70% of the distance ambulated. Pt able to perform toilet hygiene and clothing manipulation with min assist and cueing for using UE for balance and safety. Will continue to progress toward stair goal next session.   Follow Up Recommendations  Home health PT     Equipment Recommendations  Rolling walker with 5" wheels    Recommendations for Other Services       Precautions / Restrictions Precautions Precautions: Fall;Knee Precaution Booklet Issued: Yes (comment) Precaution Comments: HEP packet provided to pt and reviewed during this session. Required Braces or Orthoses:  (none - KI not needed. Pt performed 10 SLR's) Restrictions Weight Bearing Restrictions: Yes LLE Weight Bearing: Weight bearing as tolerated    Mobility  Bed Mobility Overal bed mobility: Needs Assistance Bed Mobility: Sit to Supine       Sit to supine: Min guard   General bed mobility comments: Verbal cueing for proper sequencing to ensure safety. Pt able to lift L LE from sit to supine without physical assistance.  Transfers Overall transfer level: Needs assistance Equipment used: Rolling walker (2 wheeled) Transfers: Sit to/from Stand Sit to Stand: Min guard          General transfer comment: Verbal cueing for proper sequencing. Safe techniques performed. Pt denies any dizziness upon standing.   Ambulation/Gait Ambulation/Gait assistance: Min guard Ambulation Distance (Feet): 250 Feet Assistive device: Rolling walker (2 wheeled) Gait Pattern/deviations: Step-through pattern;Decreased stance time - left;Decreased step length - right;Trendelenburg;Antalgic (Excessive side bending to L during stance on L LE.)     General Gait Details: Pt ambulated on room air with RW. O2 sats at 93% at rest on room air. No chair follow. Verbal and tactile cueing for upright posture. Verbal cueing for breathing, continuous gait, and step through pattern. Pt able to maintain this gait roughly 70% of the time.    Stairs            Wheelchair Mobility    Modified Rankin (Stroke Patients Only)       Balance Overall balance assessment: Needs assistance             Standing balance comment: Pt able to stand without UE support with no LOB when prepping her clothing for toileting.                            Cognition Arousal/Alertness: Awake/alert Behavior During Therapy: WFL for tasks assessed/performed Overall Cognitive Status: Within Functional Limits for tasks assessed                                        Exercises Other Exercises Other Exercises: Recliner ther-ex x12  on L LE included: ankle pumps B, quad sets, LAQ, and hip abd/add. Cueing for proper sequencing. Performed on room air, no SOB noted. Other Exercises: Pt performed toileting with min assist for clothing manipulation and hygiene. Safe technique performed.    General Comments        Pertinent Vitals/Pain Pain Assessment: 0-10 Pain Score: 0-No pain Pain Location: L knee Pain Descriptors / Indicators: Aching Pain Intervention(s): Premedicated before session;Monitored during session;Ice applied    Home Living                      Prior Function             PT Goals (current goals can now be found in the care plan section) Acute Rehab PT Goals Patient Stated Goal: to go home PT Goal Formulation: With patient Time For Goal Achievement: 06/29/16 Potential to Achieve Goals: Good Progress towards PT goals: Progressing toward goals    Frequency    BID      PT Plan Current plan remains appropriate    Co-evaluation              AM-PAC PT "6 Clicks" Daily Activity  Outcome Measure  Difficulty turning over in bed (including adjusting bedclothes, sheets and blankets)?: A Little Difficulty moving from lying on back to sitting on the side of the bed? : A Little Difficulty sitting down on and standing up from a chair with arms (e.g., wheelchair, bedside commode, etc,.)?: Total Help needed moving to and from a bed to chair (including a wheelchair)?: A Little Help needed walking in hospital room?: A Little Help needed climbing 3-5 steps with a railing? : A Lot 6 Click Score: 15    End of Session Equipment Utilized During Treatment: Gait belt Activity Tolerance: Patient tolerated treatment well Patient left: in bed;with bed alarm set;with SCD's reapplied;with call bell/phone within reach Nurse Communication: Mobility status (Pt urinated during this session.) PT Visit Diagnosis: Other abnormalities of gait and mobility (R26.89);Muscle weakness (generalized) (M62.81);Pain Pain - Right/Left: Left Pain - part of body: Knee     Time: 3361-2244 PT Time Calculation (min) (ACUTE ONLY): 49 min  Charges:  $Gait Training: 8-22 mins $Therapeutic Exercise: 8-22 mins $Therapeutic Activity: 8-22 mins                    G Codes:       Donaciano Eva, PT, SPT  Marni Griffon 06/15/2016, 4:33 PM

## 2016-06-15 NOTE — Progress Notes (Signed)
Clinical Social Worker (CSW) received SNF consult. PT is recommending home health. RN case manager aware of above. Please reconsult if future social work needs arise. CSW signing off.   Kaeli Nichelson, LCSW [phone] 

## 2016-06-15 NOTE — Progress Notes (Signed)
[facility] responded to an OR for AD. [facility] provided AD education to the Pt. Pt stated she needs more time to review the AD and would contact the [facility] when ready to complete it. [place] informed Pt that [facility] services were available as needed.    06/15/16 1200  Clinical Encounter Type  Visited With Patient  Visit Type Initial;Other (Comment)  Referral From Nurse  Consult/Referral To Chaplain  Spiritual Encounters  Spiritual Needs Literature;Other (Comment)

## 2016-06-16 LAB — BASIC METABOLIC PANEL
Anion gap: 7 (ref 5–15)
BUN: 11 mg/dL (ref 6–20)
CHLORIDE: 102 mmol/L (ref 101–111)
CO2: 28 mmol/L (ref 22–32)
CREATININE: 0.51 mg/dL (ref 0.44–1.00)
Calcium: 8.2 mg/dL — ABNORMAL LOW (ref 8.9–10.3)
GFR calc Af Amer: >60 mL/min (ref >60)
GLUCOSE: 171 mg/dL — AB (ref 65–99)
POTASSIUM: 3.8 mmol/L (ref 3.5–5.1)
SODIUM: 137 mmol/L (ref 135–145)

## 2016-06-16 LAB — CBC
HEMATOCRIT: 30.1 % — AB (ref 35.0–47.0)
Hemoglobin: 10.1 g/dL — ABNORMAL LOW (ref 12.0–16.0)
MCH: 29.7 pg (ref 26.0–34.0)
MCHC: 33.6 g/dL (ref 32.0–36.0)
MCV: 88.3 fL (ref 80.0–100.0)
PLATELETS: 137 10*3/uL — AB (ref 150–440)
RBC: 3.41 MIL/uL — ABNORMAL LOW (ref 3.80–5.20)
RDW: 14.7 % — AB (ref 11.5–14.5)
WBC: 9.4 10*3/uL (ref 3.6–11.0)

## 2016-06-16 LAB — GLUCOSE, CAPILLARY
Glucose-Capillary: 145 mg/dL — ABNORMAL HIGH (ref 65–99)
Glucose-Capillary: 188 mg/dL — ABNORMAL HIGH (ref 65–99)

## 2016-06-16 MED ORDER — OXYCODONE HCL 5 MG PO TABS: 5.0000 mg | ORAL_TABLET | ORAL | 0 refills | Status: DC | PRN

## 2016-06-16 NOTE — Progress Notes (Signed)
Physical Therapy Treatment Patient Details Name: COURTENEY ALDERETE MRN: [id number] DOB: [birth year] Today's Date: 06/16/2016    History of Present Illness Pt is a 71 y/o female who received a L TKA yesterday (06/14/2016). Pt was previously independent on all her ADL's and ambulated without an AD. Pt is on contact precautions secondary to MRSA. PMH includes polyneuropathy, OA, COPD, DM, diverticulitis, edema, GERD, high cholesterol, HTN, hypothyroid, TIA, Von Willebrand's syndrome, OSA, and colon polyp.     PT Comments    Pt is progressing well toward her goals for ambulation, transfers/bed mobility, and stairs. Pt ambulated 136ft with RW and min guard and she negotiated 1 step with RW 2x with min assist. Pt L knee AROM: 0-90deg. This session limited largely due to pain (8/10). Pt planning to dc to daughter's home this afternoon and PT agrees she is appropriate for dc today.    Follow Up Recommendations  Home health PT     Equipment Recommendations  Rolling walker with 5" wheels    Recommendations for Other Services       Precautions / Restrictions Precautions Precautions: Fall;Knee Precaution Booklet Issued: Yes (comment) Precaution Comments: HEP packet provided to pt and reviewed during this session. Restrictions Weight Bearing Restrictions: Yes LLE Weight Bearing: Weight bearing as tolerated    Mobility  Bed Mobility               General bed mobility comments: Did not perform. Pt in recliner upon PT arrival to room.  Transfers Overall transfer level: Needs assistance Equipment used: Rolling walker (2 wheeled) Transfers: Sit to/from Stand Sit to Stand: Min guard         General transfer comment: Verbal cueing for proper sequencing. Safe techniques performed. Pt denies any dizziness upon standing.   Ambulation/Gait Ambulation/Gait assistance: Min guard. Ambulation Distance (Feet): 150 Feet Assistive device: Rolling walker (2 wheeled) Gait Pattern/deviations:  Step-to pattern;Decreased stance time - left;Decreased step length - right;Antalgic;Trunk flexed     General Gait Details: Pt ambulated on room air with RW. SAO2 88% at rest with no signs or sx of SOB. SAO2 90% after ambulating with mild SOB secondary to pain and distance ambulated. No dizziness when ambulating. Verbal cueing for smaller step length on L, longer step length on R. Pt able to maintain continuous step through gait <50% of the time secondary to pain.   Stairs Stairs: Yes   Stair Management: No rails;Step to pattern;With walker Number of Stairs: 1 (Pt dc to daughter's home with 1 step with no railing) General stair comments: Pt required moderate verbal cueing for proper sequencing. 2 PT's present to maintain pt safety. Pt negotiated 1 step 2x for reinforcement of technique and sequencing.  Wheelchair Mobility    Modified Rankin (Stroke Patients Only)       Balance Overall balance assessment: Modified Independent Sitting-balance support: Feet unsupported   Sitting balance - Comments: Pt able to sit at edge of recliner with no UE assistance, feet supported wtih supervision. No dizziness noted.   Standing balance support: No upper extremity supported   Standing balance comment: Pt able to stand with UE support and no LOB/dizziness noted.                            Cognition Arousal/Alertness: Awake/alert Behavior During Therapy: WFL for tasks assessed/performed Overall Cognitive Status: Within Functional Limits for tasks assessed  Exercises Total Joint Exercises Goniometric ROM: L knee AROM: 0-90 Other Exercises Other Exercises: Recliner ther-ex x15 on L LE included: ankle pumps B, quad sets, heel slides, and hip abd/add. Cueing for proper sequencing. Performed on room air, no SOB noted.    General Comments        Pertinent Vitals/Pain Pain Assessment: 0-10 Pain Score: 8  Pain Location: L  knee Pain Descriptors / Indicators: Grimacing;Operative site guarding;Sore;Tender;Aching Pain Intervention(s): RN gave pain meds during session;Limited activity within patient's tolerance;Monitored during session;Repositioned;Ice applied    Home Living                      Prior Function            PT Goals (current goals can now be found in the care plan section) Acute Rehab PT Goals Patient Stated Goal: to go home PT Goal Formulation: With patient Time For Goal Achievement: 06/29/16 Potential to Achieve Goals: Good Progress towards PT goals: Progressing toward goals    Frequency    BID      PT Plan Current plan remains appropriate    Co-evaluation              AM-PAC PT "6 Clicks" Daily Activity  Outcome Measure  Difficulty turning over in bed (including adjusting bedclothes, sheets and blankets)?: None Difficulty moving from lying on back to sitting on the side of the bed? : None Difficulty sitting down on and standing up from a chair with arms (e.g., wheelchair, bedside commode, etc,.)?: Total Help needed moving to and from a bed to chair (including a wheelchair)?: A Little Help needed walking in hospital room?: A Little Help needed climbing 3-5 steps with a railing? : A Little 6 Click Score: 18    End of Session Equipment Utilized During Treatment: Gait belt Activity Tolerance: Patient limited by pain Patient left: in chair;with call bell/phone within reach;with chair alarm set;with SCD's reapplied Nurse Communication: Patient requests pain meds;Mobility status PT Visit Diagnosis: Other abnormalities of gait and mobility (R26.89);Muscle weakness (generalized) (M62.81);Pain Pain - Right/Left: Left Pain - part of body: Knee     Time: 0922-1022 PT Time Calculation (min) (ACUTE ONLY): 60 min  Charges:                       G Codes:       Donaciano Eva, PT, SPT   [place] 06/16/2016, 12:02 PM

## 2016-06-16 NOTE — Progress Notes (Signed)
  Subjective: 2 Days Post-Op Procedure(s) (LRB): TOTAL KNEE ARTHROPLASTY (Left) Patient reports pain as mild.   Patient is well, and has had no acute complaints or problems PT and care management to assist with patient discharge. Negative for chest pain and shortness of breath Fever: no, 99 temp last night Gastrointestinal:Negative for nausea and vomiting last night.  Objective: Vital signs in last 24 hours: Temp:  [98.5 F (36.9 C)-99.3 F (37.4 C)] 99 F (37.2 C) (06/06 2331) Pulse Rate:  [63-82] 63 (06/06 2332) Resp:  [18-20] 18 (06/06 2331) BP: (141-154)/(52-61) 154/61 (06/06 2331) SpO2:  [88 %-97 %] 94 % (06/06 2332)  Intake/Output from previous day:  Intake/Output Summary (Last 24 hours) at 06/16/16 1043 Last data filed at 06/15/16 1500  Gross per 24 hour  Intake             1000 ml  Output                0 ml  Net             1000 ml    Intake/Output this shift: No intake/output data recorded.  Labs:  Recent Labs  06/15/16 0529 06/16/16 0614  HGB 10.3* 10.1*    Recent Labs  06/15/16 0529 06/16/16 0614  WBC 9.1 9.4  RBC 3.44* 3.41*  HCT 31.1* 30.1*  PLT 136* 137*    Recent Labs  06/15/16 0529 06/16/16 0614  NA 138 137  K 4.6 3.8  CL 105 102  CO2 28 28  BUN 12 11  CREATININE 0.45 0.51  GLUCOSE 151* 171*  CALCIUM 8.2* 8.2*   No results for input(s): LABPT, INR in the last 72 hours.   EXAM General - Patient is Alert, Appropriate and Oriented Extremity - Sensation intact distally Intact pulses distally Dorsiflexion/Plantar flexion intact Incision: scant drainage No cellulitis present Dressing/Incision - blood tinged drainage at the distal aspect of the incision. Motor Function - intact, moving foot and toes well on exam.   Abdomen soft with normal BS, no tympany present this AM.  Past Medical History:  Diagnosis Date  . Arthritis    osteo  . Cancer ([place])    CERVICAL CA  . COPD (chronic obstructive pulmonary disease) ([place])   .  Diabetes mellitus without complication ([place])   . Diabetic peripheral neuropathy ([place])   . Environmental and seasonal allergies   . GERD (gastroesophageal reflux disease)   . History of cervical cancer   . Hypercholesteremia   . Hypertension   . Hypothyroidism   . Neuropathy   . Sleep apnea   . TIA (transient ischemic attack)   . Von Willebrand's disease ([place])     Assessment/Plan: 2 Days Post-Op Procedure(s) (LRB): TOTAL KNEE ARTHROPLASTY (Left) Active Problems:   Status post total knee replacement using cement, left  Estimated body mass index is 36.65 kg/m as calculated from the following:   Height as of this encounter: 5\' 6"  (1.676 m).   Weight as of this encounter: 103 kg (227 lb 1.2 oz). Advance diet Up with therapy  Labs reviewed, Hg stable at 10.3, platelets 137 this AM.   Pt reports passing gas, no BM yet.  Will give suppository prior to discharge. Cleared with PT, recommending HHPT. Plan will be for discharge home today.  DVT Prophylaxis - Lovenox, Foot Pumps and TED hose Weight-Bearing as tolerated to left leg  J. Cameron Proud, PA-C [facility] Orthopaedic Surgery 06/16/2016, 10:43 AM

## 2016-06-16 NOTE — Care Management (Signed)
Patient discharging to home today followed by [facility] at [facility]. I have notified Tim with [facility] of patient discharge. No further RNCM needs. Case closed.

## 2016-06-16 NOTE — Discharge Summary (Signed)
Physician Discharge Summary  Patient ID: Samantha Henry MRN: [id number] DOB/AGE: 08/04/1945 71 y.o.  Admit date: 06/14/2016 Discharge date: 06/16/2016  Admission Diagnoses:  PRIMARY OSTEOARTHRITIS Left knee degenerative joint disease  Discharge Diagnoses: Patient Active Problem List   Diagnosis Date Noted  . Status post total knee replacement using cement, left 06/14/2016  . Polyneuropathy 02/26/2015  . Osteoarthritis 08/02/2014  . Allergic rhinitis 07/29/2014  . COPD (chronic obstructive pulmonary disease) ([place]) 07/25/2014  . Diabetes ([place]) 07/25/2014  . Diverticulosis of colon 07/25/2014  . Edema 07/25/2014  . GERD (gastroesophageal reflux disease) 07/25/2014  . Hypercholesteremia 07/25/2014  . Hypertension 07/25/2014  . Hypothyroid 07/25/2014  . Cramps of lower extremity 07/25/2014  . Microalbuminuria 07/25/2014  . Nail dystrophy 07/25/2014  . Osteoarthritis of leg 07/25/2014  . Restless legs syndrome 07/25/2014  . TIA (transient ischemic attack) 07/25/2014  . Von Willebrand's disease ([place]) 07/25/2014  . OSA (obstructive sleep apnea) 10/31/2013  . History of adenomatous polyp of colon 08/26/2005  Left knee degenerative joint disease  Past Medical History:  Diagnosis Date  . Arthritis    osteo  . Cancer ([place])    CERVICAL CA  . COPD (chronic obstructive pulmonary disease) ([place])   . Diabetes mellitus without complication ([place])   . Diabetic peripheral neuropathy ([place])   . Environmental and seasonal allergies   . GERD (gastroesophageal reflux disease)   . History of cervical cancer   . Hypercholesteremia   . Hypertension   . Hypothyroidism   . Neuropathy   . Sleep apnea   . TIA (transient ischemic attack)   . Von Willebrand's disease [facility])    Transfusion: None.   Consultants (if any): None.  Discharged Condition: Improved, stable.  Hospital Course: Samantha Henry is an 71 y.o. female who was admitted 06/14/2016 with a diagnosis of left knee degenerative  joint disease and went to the operating room on 06/14/2016 and underwent the above named procedures.    Surgeries: Procedure(s): TOTAL KNEE ARTHROPLASTY on 06/14/2016 Patient tolerated the surgery well. Taken to PACU where she was stabilized and then transferred to the orthopedic floor.  Started on Lovenox 40mg  q 24 hrs. Foot pumps applied bilaterally at 80 mm. Heels elevated on bed with rolled towels. No evidence of DVT. Negative [place]. Physical therapy started on day #1 for gait training and transfer. OT started day #1 for ADL and assisted devices.  Patient's IV and foley was d/c on POD1.  Implants: Left TKA using all-cemented [employer] Vanguard system with a 65 mm PCR femur, a 71 mm tibial tray with a 10 mm AS E-poly insert, and a 34 x 8.5 mm all-poly 3-pegged domed patella.  She was given perioperative antibiotics:  Anti-infectives    Start     Dose/Rate Route Frequency Ordered Stop   06/14/16 1900  vancomycin (VANCOCIN) 1,250 mg in sodium chloride 0.9 % 250 mL IVPB     1,250 mg 166.7 mL/hr over 90 Minutes Intravenous  Once 06/14/16 1052 06/14/16 2344   06/14/16 1030  vancomycin (VANCOCIN) 1,250 mg in sodium chloride 0.9 % 250 mL IVPB  Status:  Discontinued     1,250 mg 166.7 mL/hr over 90 Minutes Intravenous  Once 06/14/16 1007 06/14/16 1052   06/14/16 1015  vancomycin (VANCOCIN) IVPB 1000 mg/200 mL premix  Status:  Discontinued     1,000 mg 200 mL/hr over 60 Minutes Intravenous Every 12 hours 06/14/16 1005 06/14/16 1007   06/14/16 0045  vancomycin (VANCOCIN) 1,250 mg in sodium chloride 0.9 %  250 mL IVPB     1,250 mg 166.7 mL/hr over 90 Minutes Intravenous  Once 06/14/16 0037 06/14/16 0811    . She was given sequential compression devices, early ambulation, and Lovenox for DVT prophylaxis.  She benefited maximally from the hospital stay and there were no complications.    Recent vital signs:  Vitals:   06/15/16 2331 06/15/16 2332  BP: (!) 154/61   Pulse: 65 63  Resp: 18   Temp:  99 F (37.2 C)     Recent laboratory studies:  Lab Results  Component Value Date   HGB 10.1 (L) 06/16/2016   HGB 10.3 (L) 06/15/2016   HGB 12.4 06/01/2016   Lab Results  Component Value Date   WBC 9.4 06/16/2016   PLT 137 (L) 06/16/2016   Lab Results  Component Value Date   INR 1.14 06/01/2016   Lab Results  Component Value Date   NA 137 06/16/2016   K 3.8 06/16/2016   CL 102 06/16/2016   CO2 28 06/16/2016   BUN 11 06/16/2016   CREATININE 0.51 06/16/2016   GLUCOSE 171 (H) 06/16/2016    Discharge Medications:   Allergies as of 06/16/2016      Reactions   Aspirin    AVOIDs due to history of Von Willebrands   Lyrica [pregabalin] Other (See Comments)   Dizziness   Tetracycline       Medication List    TAKE these medications   acetaminophen 650 MG CR tablet Commonly known as:  TYLENOL Take 650 mg by mouth every 8 (eight) hours as needed for pain.   albuterol 108 (90 Base) MCG/ACT inhaler Commonly known as:  PROVENTIL HFA;VENTOLIN HFA Inhale 2 puffs into the lungs every 6 (six) hours as needed for wheezing or shortness of breath.   aspirin 81 MG tablet Take 1 tablet by mouth daily.   CALCIUM 500 PO Take 1 tablet by mouth 2 (two) times daily.   calcium carbonate 750 MG chewable tablet Commonly known as:  TUMS EX Chew 2 tablets by mouth 2 (two) times daily as needed for heartburn.   cholecalciferol 400 units Tabs tablet Commonly known as:  VITAMIN D Take 400 Units by mouth daily.   diphenhydrAMINE 25 MG tablet Commonly known as:  BENADRYL Take 25 mg by mouth every 6 (six) hours as needed.   DULoxetine HCl 40 MG Cpep Take 40 mg by mouth at bedtime.   FISH OIL BURP-LESS 1000 MG Caps Take 1 tablet by mouth 2 (two) times daily.   furosemide 20 MG tablet Commonly known as:  LASIX Take 1 tablet (20 mg total) by mouth as needed.   ibuprofen 200 MG tablet Commonly known as:  ADVIL,MOTRIN Take 1-2 tablets by mouth as needed.   levothyroxine 125 MCG  tablet Commonly known as:  SYNTHROID, LEVOTHROID Take 1 tablet (125 mcg total) by mouth daily before breakfast.   losartan 50 MG tablet Commonly known as:  COZAAR TAKE ONE TABLET BY MOUTH ONCE DAILY   metFORMIN 500 MG 24 hr tablet Commonly known as:  GLUCOPHAGE-XR TAKE TWO TABLETS BY MOUTH IN THE MORNING AND TWO IN THE EVENING   MULTIPLE VITAMIN PO Take 1 tablet by mouth daily.   naproxen 500 MG tablet Commonly known as:  NAPROSYN Take 1 tablet (500 mg total) by mouth 2 (two) times daily with a meal. As needed for knee pain   nortriptyline 10 MG capsule Commonly known as:  PAMELOR Take 10 mg by mouth at bedtime.  ONE TOUCH ULTRA TEST test strip Generic drug:  glucose blood USE TO CHECK BLOOD GLUCOSE ONCE DAILY   oxyCODONE 5 MG immediate release tablet Commonly known as:  Oxy IR/ROXICODONE Take 1-2 tablets (5-10 mg total) by mouth every 4 (four) hours as needed for breakthrough pain.   oxymetazoline 0.05 % nasal spray Commonly known as:  AFRIN Place 2 sprays into both nostrils at bedtime.   polyethylene glycol packet Commonly known as:  MIRALAX / GLYCOLAX Take 17 g by mouth daily as needed.   pseudoephedrine-dextromethorphan-guaifenesin 30-10-100 MG/5ML solution Commonly known as:  ROBITUSSIN-PE Take 10 mLs by mouth 4 (four) times daily as needed for cough.   RABEprazole 20 MG tablet Commonly known as:  ACIPHEX TAKE ONE TABLET BY MOUTH ONCE DAILY   rosuvastatin 20 MG tablet Commonly known as:  CRESTOR TAKE ONE TABLET BY MOUTH ONCE DAILY What changed:  See the new instructions.   SPIRIVA HANDIHALER 18 MCG inhalation capsule Generic drug:  tiotropium Place 1 Inhaler into inhaler and inhale daily.   SPIRIVA RESPIMAT 1.25 MCG/ACT Aers Generic drug:  Tiotropium Bromide Monohydrate Inhale 1 spray into the lungs every morning.   budesonide-formoterol 160-4.5 MCG/ACT inhaler Commonly known as:  SYMBICORT Inhale 1 puff into the lungs 2 (two) times daily.    SYMBICORT 160-4.5 MCG/ACT inhaler Generic drug:  budesonide-formoterol Inhale 2 puffs into the lungs 2 (two) times daily.   trolamine salicylate 10 % cream Commonly known as:  ASPERCREME Apply 1 application topically as needed for muscle pain.   vitamin B-12 50 MCG tablet Commonly known as:  CYANOCOBALAMIN Take 50 mcg by mouth daily.            Durable Medical Equipment        Start     Ordered   06/14/16 1216  DME Walker rolling  Once    Question:  Patient needs a walker to treat with the following condition  Answer:  Status post total knee replacement using cement, left   06/14/16 1215   06/14/16 1216  DME 3 n 1  Once     06/14/16 1215   06/14/16 1216  DME Bedside commode  Once    Question:  Patient needs a bedside commode to treat with the following condition  Answer:  Status post total knee replacement using cement, left   06/14/16 1215      Diagnostic Studies: Dg Chest 2 View  Result Date: 06/01/2016 CLINICAL DATA:  Preop total knee replacement. EXAM: CHEST  2 VIEW COMPARISON:  None. FINDINGS: The heart size and mediastinal contours are within normal limits. Both lungs are clear. The visualized skeletal structures are unremarkable. IMPRESSION: No active cardiopulmonary disease. Electronically Signed   By: Kathreen Devoid   On: 06/01/2016 14:00   Dg Knee Left Port  Result Date: 06/14/2016 CLINICAL DATA:  Status post total right knee joint replacement. EXAM: PORTABLE LEFT KNEE - 1-2 VIEW COMPARISON:  None in PACs FINDINGS: AP and lateral views of the right knee reveal placement of a total joint prosthesis. Positioning of the prosthetic components appears good. The interface with the native bone appears normal. No acute native bone abnormality is observed. There is a small amount of fluid and gas within the anterior soft tissues. Surgical skin staples are present. IMPRESSION: No immediate postprocedure complication following left total knee joint prosthesis placement.  Electronically Signed   By: David  Martinique M.D.   On: 06/14/2016 10:20   Disposition: Plan is for discharge home today with HHPT.  Follow-up Information    Lattie Corns, PA-C Follow up in 14 day(s).   Specialty:  Physician Assistant Why:  Electa Sniff information: [place] [id number] [phone]          Signed: Judson Roch PA-C 06/16/2016, 10:47 AM

## 2016-06-16 NOTE — Discharge Planning (Signed)
Patient IV removed.  Discharge papers given, explained and educated.  RN assessment and VS revealed stability for DC to home with [facility].  Dressing changed and extra supplies given for home as needed. Informed of suggested FU appt and appt made.  Pain script given and signed.  Also had BM before DC.  Wheeled to front and family transporting home with walker and polar car.

## 2016-06-16 NOTE — Discharge Instructions (Signed)
Diet: As you were doing prior to hospitalization   Shower:  May shower but keep the wounds dry, use an occlusive plastic wrap, NO SOAKING IN TUB.  If the bandage gets wet, change with a clean dry gauze.  Dressing:  You may change your dressing as needed. Change the dressing with sterile gauze dressing.    Activity:  Increase activity slowly as tolerated, but follow the weight bearing instructions below.  No lifting or driving for 6 weeks.  Weight Bearing:   Weight bearing as tolerated to left lower extremity  To prevent constipation: you may use a stool softener such as -  Colace (over the counter) 100 mg by mouth twice a day  Drink plenty of fluids (prune juice may be helpful) and high fiber foods Miralax (over the counter) for constipation as needed.    Itching:  If you experience itching with your medications, try taking only a single pain pill, or even half a pain pill at a time.  You may take up to 10 pain pills per day, and you can also use benadryl over the counter for itching or also to help with sleep.   Precautions:  If you experience chest pain or shortness of breath - call 911 immediately for transfer to the hospital emergency department!!  If you develop a fever greater that 101 F, purulent drainage from wound, increased redness or drainage from wound, or calf pain-Call [place]                                              Follow- Up Appointment:  Please call for an appointment to be seen in 2 weeks at [facility]

## 2016-06-17 DIAGNOSIS — I10 Essential (primary) hypertension: Secondary | ICD-10-CM | POA: Diagnosis not present

## 2016-06-17 DIAGNOSIS — Z8673 Personal history of transient ischemic attack (TIA), and cerebral infarction without residual deficits: Secondary | ICD-10-CM | POA: Diagnosis not present

## 2016-06-17 DIAGNOSIS — Z96652 Presence of left artificial knee joint: Secondary | ICD-10-CM | POA: Diagnosis not present

## 2016-06-17 DIAGNOSIS — D680 Von Willebrand's disease: Secondary | ICD-10-CM | POA: Diagnosis not present

## 2016-06-17 DIAGNOSIS — Z79891 Long term (current) use of opiate analgesic: Secondary | ICD-10-CM | POA: Diagnosis not present

## 2016-06-17 DIAGNOSIS — E1142 Type 2 diabetes mellitus with diabetic polyneuropathy: Secondary | ICD-10-CM | POA: Diagnosis not present

## 2016-06-17 DIAGNOSIS — Z7901 Long term (current) use of anticoagulants: Secondary | ICD-10-CM | POA: Diagnosis not present

## 2016-06-17 DIAGNOSIS — J449 Chronic obstructive pulmonary disease, unspecified: Secondary | ICD-10-CM | POA: Diagnosis not present

## 2016-06-17 DIAGNOSIS — Z7984 Long term (current) use of oral hypoglycemic drugs: Secondary | ICD-10-CM | POA: Diagnosis not present

## 2016-06-17 DIAGNOSIS — Z8541 Personal history of malignant neoplasm of cervix uteri: Secondary | ICD-10-CM | POA: Diagnosis not present

## 2016-06-17 DIAGNOSIS — Z471 Aftercare following joint replacement surgery: Secondary | ICD-10-CM | POA: Diagnosis not present

## 2016-06-20 DIAGNOSIS — D680 Von Willebrand's disease: Secondary | ICD-10-CM | POA: Diagnosis not present

## 2016-06-20 DIAGNOSIS — J449 Chronic obstructive pulmonary disease, unspecified: Secondary | ICD-10-CM | POA: Diagnosis not present

## 2016-06-20 DIAGNOSIS — Z79891 Long term (current) use of opiate analgesic: Secondary | ICD-10-CM | POA: Diagnosis not present

## 2016-06-20 DIAGNOSIS — I10 Essential (primary) hypertension: Secondary | ICD-10-CM | POA: Diagnosis not present

## 2016-06-20 DIAGNOSIS — Z7984 Long term (current) use of oral hypoglycemic drugs: Secondary | ICD-10-CM | POA: Diagnosis not present

## 2016-06-20 DIAGNOSIS — Z7901 Long term (current) use of anticoagulants: Secondary | ICD-10-CM | POA: Diagnosis not present

## 2016-06-20 DIAGNOSIS — Z8673 Personal history of transient ischemic attack (TIA), and cerebral infarction without residual deficits: Secondary | ICD-10-CM | POA: Diagnosis not present

## 2016-06-20 DIAGNOSIS — Z471 Aftercare following joint replacement surgery: Secondary | ICD-10-CM | POA: Diagnosis not present

## 2016-06-20 DIAGNOSIS — E1142 Type 2 diabetes mellitus with diabetic polyneuropathy: Secondary | ICD-10-CM | POA: Diagnosis not present

## 2016-06-20 DIAGNOSIS — Z96652 Presence of left artificial knee joint: Secondary | ICD-10-CM | POA: Diagnosis not present

## 2016-06-20 DIAGNOSIS — Z8541 Personal history of malignant neoplasm of cervix uteri: Secondary | ICD-10-CM | POA: Diagnosis not present

## 2016-06-27 ENCOUNTER — Encounter: Payer: Self-pay | Admitting: Surgery

## 2016-06-27 DIAGNOSIS — E1142 Type 2 diabetes mellitus with diabetic polyneuropathy: Secondary | ICD-10-CM | POA: Diagnosis not present

## 2016-06-27 DIAGNOSIS — I10 Essential (primary) hypertension: Secondary | ICD-10-CM | POA: Diagnosis not present

## 2016-06-27 DIAGNOSIS — Z7901 Long term (current) use of anticoagulants: Secondary | ICD-10-CM | POA: Diagnosis not present

## 2016-06-27 DIAGNOSIS — Z8673 Personal history of transient ischemic attack (TIA), and cerebral infarction without residual deficits: Secondary | ICD-10-CM | POA: Diagnosis not present

## 2016-06-27 DIAGNOSIS — Z79891 Long term (current) use of opiate analgesic: Secondary | ICD-10-CM | POA: Diagnosis not present

## 2016-06-27 DIAGNOSIS — J449 Chronic obstructive pulmonary disease, unspecified: Secondary | ICD-10-CM | POA: Diagnosis not present

## 2016-06-27 DIAGNOSIS — Z8541 Personal history of malignant neoplasm of cervix uteri: Secondary | ICD-10-CM | POA: Diagnosis not present

## 2016-06-27 DIAGNOSIS — D680 Von Willebrand's disease: Secondary | ICD-10-CM | POA: Diagnosis not present

## 2016-06-27 DIAGNOSIS — Z7984 Long term (current) use of oral hypoglycemic drugs: Secondary | ICD-10-CM | POA: Diagnosis not present

## 2016-06-27 DIAGNOSIS — Z96652 Presence of left artificial knee joint: Secondary | ICD-10-CM | POA: Diagnosis not present

## 2016-06-27 DIAGNOSIS — Z471 Aftercare following joint replacement surgery: Secondary | ICD-10-CM | POA: Diagnosis not present

## 2016-06-29 ENCOUNTER — Ambulatory Visit
Admission: RE | Admit: 2016-06-29 | Discharge: 2016-06-29 | Disposition: A | Discharge status: Home / Self Care | Payer: PPO | Source: Ambulatory Visit | Attending: Student | Admitting: Student

## 2016-06-29 ENCOUNTER — Other Ambulatory Visit: Payer: Self-pay | Admitting: Student

## 2016-06-29 DIAGNOSIS — M25662 Stiffness of left knee, not elsewhere classified: Secondary | ICD-10-CM | POA: Diagnosis not present

## 2016-06-29 DIAGNOSIS — M7989 Other specified soft tissue disorders: Secondary | ICD-10-CM

## 2016-06-29 DIAGNOSIS — M6281 Muscle weakness (generalized): Secondary | ICD-10-CM | POA: Diagnosis not present

## 2016-06-29 DIAGNOSIS — M25562 Pain in left knee: Secondary | ICD-10-CM | POA: Diagnosis not present

## 2016-06-29 DIAGNOSIS — G8929 Other chronic pain: Secondary | ICD-10-CM | POA: Diagnosis not present

## 2016-06-29 DIAGNOSIS — Z96652 Presence of left artificial knee joint: Secondary | ICD-10-CM | POA: Diagnosis not present

## 2016-07-01 DIAGNOSIS — M25562 Pain in left knee: Secondary | ICD-10-CM | POA: Diagnosis not present

## 2016-07-01 DIAGNOSIS — G8929 Other chronic pain: Secondary | ICD-10-CM | POA: Diagnosis not present

## 2016-07-01 DIAGNOSIS — M6281 Muscle weakness (generalized): Secondary | ICD-10-CM | POA: Diagnosis not present

## 2016-07-01 DIAGNOSIS — Z96652 Presence of left artificial knee joint: Secondary | ICD-10-CM | POA: Diagnosis not present

## 2016-07-01 DIAGNOSIS — M25662 Stiffness of left knee, not elsewhere classified: Secondary | ICD-10-CM | POA: Diagnosis not present

## 2016-07-06 DIAGNOSIS — Z96652 Presence of left artificial knee joint: Secondary | ICD-10-CM | POA: Diagnosis not present

## 2016-07-06 DIAGNOSIS — M6281 Muscle weakness (generalized): Secondary | ICD-10-CM | POA: Diagnosis not present

## 2016-07-06 DIAGNOSIS — M25562 Pain in left knee: Secondary | ICD-10-CM | POA: Diagnosis not present

## 2016-07-06 DIAGNOSIS — G8929 Other chronic pain: Secondary | ICD-10-CM | POA: Diagnosis not present

## 2016-07-06 DIAGNOSIS — M25662 Stiffness of left knee, not elsewhere classified: Secondary | ICD-10-CM | POA: Diagnosis not present

## 2016-07-08 DIAGNOSIS — Z96652 Presence of left artificial knee joint: Secondary | ICD-10-CM | POA: Diagnosis not present

## 2016-07-15 DIAGNOSIS — M25562 Pain in left knee: Secondary | ICD-10-CM | POA: Diagnosis not present

## 2016-07-15 DIAGNOSIS — Z96652 Presence of left artificial knee joint: Secondary | ICD-10-CM | POA: Diagnosis not present

## 2016-07-20 DIAGNOSIS — G8929 Other chronic pain: Secondary | ICD-10-CM | POA: Diagnosis not present

## 2016-07-20 DIAGNOSIS — M6281 Muscle weakness (generalized): Secondary | ICD-10-CM | POA: Diagnosis not present

## 2016-07-20 DIAGNOSIS — M25662 Stiffness of left knee, not elsewhere classified: Secondary | ICD-10-CM | POA: Diagnosis not present

## 2016-07-20 DIAGNOSIS — Z96652 Presence of left artificial knee joint: Secondary | ICD-10-CM | POA: Diagnosis not present

## 2016-07-20 DIAGNOSIS — M25562 Pain in left knee: Secondary | ICD-10-CM | POA: Diagnosis not present

## 2016-07-22 DIAGNOSIS — G8929 Other chronic pain: Secondary | ICD-10-CM | POA: Diagnosis not present

## 2016-07-22 DIAGNOSIS — Z96652 Presence of left artificial knee joint: Secondary | ICD-10-CM | POA: Diagnosis not present

## 2016-07-22 DIAGNOSIS — M25562 Pain in left knee: Secondary | ICD-10-CM | POA: Diagnosis not present

## 2016-07-22 DIAGNOSIS — M25662 Stiffness of left knee, not elsewhere classified: Secondary | ICD-10-CM | POA: Diagnosis not present

## 2016-07-22 DIAGNOSIS — M6281 Muscle weakness (generalized): Secondary | ICD-10-CM | POA: Diagnosis not present

## 2016-07-25 DIAGNOSIS — Z96652 Presence of left artificial knee joint: Secondary | ICD-10-CM | POA: Diagnosis not present

## 2016-07-25 DIAGNOSIS — G8929 Other chronic pain: Secondary | ICD-10-CM | POA: Diagnosis not present

## 2016-07-25 DIAGNOSIS — M25662 Stiffness of left knee, not elsewhere classified: Secondary | ICD-10-CM | POA: Diagnosis not present

## 2016-07-25 DIAGNOSIS — M25562 Pain in left knee: Secondary | ICD-10-CM | POA: Diagnosis not present

## 2016-07-25 DIAGNOSIS — M6281 Muscle weakness (generalized): Secondary | ICD-10-CM | POA: Diagnosis not present

## 2016-09-19 DIAGNOSIS — Z96652 Presence of left artificial knee joint: Secondary | ICD-10-CM | POA: Diagnosis not present

## 2016-10-05 DIAGNOSIS — J449 Chronic obstructive pulmonary disease, unspecified: Secondary | ICD-10-CM | POA: Diagnosis not present

## 2016-10-22 ENCOUNTER — Other Ambulatory Visit: Payer: Self-pay | Admitting: Family Medicine

## 2016-10-28 ENCOUNTER — Other Ambulatory Visit: Payer: Self-pay | Admitting: Family Medicine

## 2016-10-31 ENCOUNTER — Other Ambulatory Visit: Payer: Self-pay | Admitting: Family Medicine

## 2016-10-31 DIAGNOSIS — Z1231 Encounter for screening mammogram for malignant neoplasm of breast: Secondary | ICD-10-CM

## 2016-11-15 ENCOUNTER — Ambulatory Visit (INDEPENDENT_AMBULATORY_CARE_PROVIDER_SITE_OTHER): Payer: PPO

## 2016-11-15 VITALS — BP 148/68 | HR 84 | Temp 98.0°F | Ht 66.0 in | Wt 218.4 lb

## 2016-11-15 DIAGNOSIS — Z0000 Encounter for general adult medical examination without abnormal findings: Secondary | ICD-10-CM

## 2016-11-15 NOTE — Progress Notes (Signed)
Subjective:   Samantha Henry is a 71 y.o. female who presents for Medicare Annual (Subsequent) preventive examination.  Review of Systems:  N/A  Cardiac Risk Factors include: advanced age (>36men, >61 women);diabetes mellitus;dyslipidemia;hypertension;obesity (BMI >30kg/m2)     Objective:     Vitals: BP (!) 148/68 (BP Location: Left Arm)   Pulse 84   Temp 98 F (36.7 C) (Oral)   Ht 5\' 6"  (1.676 m)   Wt 218 lb 6.4 oz (99.1 kg)   BMI 35.25 kg/m   Body mass index is 35.25 kg/m.   Tobacco Social History   Tobacco Use  Smoking Status Former Smoker  . Packs/day: 1.50  . Types: Cigarettes  . Last attempt to quit: 01/11/2007  . Years since quitting: 9.8  Smokeless Tobacco Never Used     Counseling given: Not Answered   Past Medical History:  Diagnosis Date  . Arthritis    osteo  . Cancer ([place])    CERVICAL CA  . COPD (chronic obstructive pulmonary disease) ([place])   . Diabetes mellitus without complication ([place])   . Diabetic peripheral neuropathy ([place])   . Environmental and seasonal allergies   . GERD (gastroesophageal reflux disease)   . History of cervical cancer   . Hypercholesteremia   . Hypertension   . Hypothyroidism   . Neuropathy   . Sleep apnea   . TIA (transient ischemic attack)   . Von Willebrand's disease [facility])    Past Surgical History:  Procedure Laterality Date  . ABDOMINAL HYSTERECTOMY  1971   due to cervical cancer   . APPENDECTOMY    . BLADDER SURGERY  1997   bladder tacking  . BREAST BIOPSY Left 01/21/2015   NEG  . CARDIAC CATHETERIZATION    . CARPAL TUNNEL RELEASE  2010  . colonoscopy and upper endoscopy'    . TENDON RELEASE  2010   right foot, Dr. Vickki Muff    Family History  Problem Relation Age of Onset  . Alzheimer's disease Mother   . Cancer Father   . Diabetes Sister   . Diabetes Brother   . Hodgkin's lymphoma Sister   . Breast cancer Maternal Aunt    Social History   Substance and Sexual Activity  Sexual Activity Not on  file    Outpatient Encounter Medications as of 11/15/2016  Medication Sig  . acetaminophen (TYLENOL) 650 MG CR tablet Take 650 mg by mouth every 8 (eight) hours as needed for pain.  Marland Kitchen albuterol (PROVENTIL HFA;VENTOLIN HFA) 108 (90 Base) MCG/ACT inhaler Inhale 2 puffs into the lungs every 6 (six) hours as needed for wheezing or shortness of breath.  Marland Kitchen aspirin 81 MG tablet Take 1 tablet by mouth daily.  . budesonide-formoterol (SYMBICORT) 160-4.5 MCG/ACT inhaler Inhale 2 puffs into the lungs 2 (two) times daily.  . calcium carbonate (CALCIUM 600) 600 MG TABS tablet Take 600 mg 2 (two) times daily with a meal by mouth.  . calcium carbonate (TUMS EX) 750 MG chewable tablet Chew 2 tablets by mouth 2 (two) times daily as needed for heartburn.  . cholecalciferol (VITAMIN D) 400 units TABS tablet Take 400 Units by mouth daily.  . furosemide (LASIX) 20 MG tablet Take 1 tablet (20 mg total) by mouth as needed.  Marland Kitchen levothyroxine (SYNTHROID, LEVOTHROID) 125 MCG tablet TAKE ONE TABLET BY MOUTH ONCE DAILY BEFORE BREAKFAST  . losartan (COZAAR) 50 MG tablet TAKE ONE TABLET BY MOUTH ONCE DAILY  . metFORMIN (GLUCOPHAGE-XR) 500 MG 24 hr tablet TAKE TWO  TABLETS BY MOUTH IN THE MORNING AND TWO IN THE EVENING  . mometasone-formoterol (DULERA) 200-5 MCG/ACT AERO Inhale 2 puffs 2 (two) times daily into the lungs.  . MULTIPLE VITAMIN PO Take 1 tablet by mouth daily.  . Omega-3 Fatty Acids (FISH OIL BURP-LESS) 1000 MG CAPS Take 1 tablet by mouth 2 (two) times daily.   . ONE TOUCH ULTRA TEST test strip USE TO CHECK BLOOD GLUCOSE ONCE DAILY  . oxyCODONE (OXY IR/ROXICODONE) 5 MG immediate release tablet Take 1-2 tablets (5-10 mg total) by mouth every 4 (four) hours as needed for breakthrough pain.  Marland Kitchen oxymetazoline (AFRIN) 0.05 % nasal spray Place 2 sprays into both nostrils at bedtime.  . polyethylene glycol (MIRALAX / GLYCOLAX) packet Take 17 g by mouth daily as needed.  . pseudoephedrine-dextromethorphan-guaifenesin  (ROBITUSSIN-PE) 30-10-100 MG/5ML solution Take 10 mLs by mouth 4 (four) times daily as needed for cough.  . RABEprazole (ACIPHEX) 20 MG tablet TAKE ONE TABLET BY MOUTH ONCE DAILY  . rosuvastatin (CRESTOR) 20 MG tablet TAKE ONE TABLET BY MOUTH ONCE DAILY  . tiotropium (SPIRIVA HANDIHALER) 18 MCG inhalation capsule Place 1 Inhaler into inhaler and inhale daily.  . Tiotropium Bromide Monohydrate (SPIRIVA RESPIMAT) 1.25 MCG/ACT AERS Inhale 1 spray into the lungs every morning.  . trolamine salicylate (ASPERCREME) 10 % cream Apply 1 application topically as needed for muscle pain.  . vitamin B-12 (CYANOCOBALAMIN) 50 MCG tablet Take 50 mcg by mouth daily.  . [DISCONTINUED] budesonide-formoterol (SYMBICORT) 160-4.5 MCG/ACT inhaler Inhale 1 puff into the lungs 2 (two) times daily.  . [DISCONTINUED] Calcium-Magnesium-Vitamin D (CALCIUM 500 PO) Take 600 mg 2 (two) times daily by mouth.   Marland Kitchen FLUZONE HIGH-DOSE 0.5 ML injection   . [DISCONTINUED] diphenhydrAMINE (BENADRYL) 25 MG tablet Take 25 mg by mouth every 6 (six) hours as needed.  . [DISCONTINUED] DULoxetine HCl 40 MG CPEP Take 40 mg by mouth at bedtime.   . [DISCONTINUED] ibuprofen (ADVIL,MOTRIN) 200 MG tablet Take 1-2 tablets by mouth as needed.  . [DISCONTINUED] naproxen (NAPROSYN) 500 MG tablet Take 1 tablet (500 mg total) by mouth 2 (two) times daily with a meal. As needed for knee pain  . [DISCONTINUED] nortriptyline (PAMELOR) 10 MG capsule Take 10 mg by mouth at bedtime.    No facility-administered encounter medications on file as of 11/15/2016.     Activities of Daily Living In your present state of health, do you have any difficulty performing the following activities: 11/15/2016 06/14/2016  Hearing? N N  Vision? N N  Difficulty concentrating or making decisions? Y N  Walking or climbing stairs? N Y  Comment - -  Dressing or bathing? N N  Doing errands, shopping? N N  Preparing Food and eating ? N -  Using the Toilet? N -  In the past six  months, have you accidently leaked urine? Y -  Comment occasionally when coughing -  Do you have problems with loss of bowel control? N -  Managing your Medications? N -  Managing your Finances? N -  Housekeeping or managing your Housekeeping? N -  Some recent data might be hidden    Patient Care Team: Birdie Sons, MD as PCP - General (Family Medicine) Erby Pian, MD as Consulting Physician (Pulmonary Disease) Lollie Sails, MD as Consulting Physician (Gastroenterology)    Assessment:     Exercise Activities and Dietary recommendations Current Exercise Habits: The patient does not participate in regular exercise at present(stays active playing with 51 year old  grandaughter), Exercise limited by: None identified  Goals    None     Fall Risk Fall Risk  11/15/2016 12/31/2015 12/26/2014  Falls in the past year? Yes Yes Yes  Number falls in past yr: 1 2 or more 1  Injury with Fall? No No No  Follow up Falls prevention discussed Falls prevention discussed -   Depression Screen PHQ 2/9 Scores 11/15/2016 12/31/2015 12/26/2014  PHQ - 2 Score 0 0 0     Cognitive Function: Pt declined screening today.     6CIT Screen 12/31/2015  What Year? 0 points  What month? 0 points  What time? 0 points  Count back from 20 0 points  Months in reverse 2 points  Repeat phrase 4 points  Total Score 6    Immunization History  Administered Date(s) Administered  . Influenza, High Dose Seasonal PF 10/19/2015, 09/15/2016  . Influenza-Unspecified 02/02/2015  . Pneumococcal Conjugate-13 04/12/2013  . Pneumococcal Polysaccharide-23 11/24/2011  . Tdap 06/06/2008  . Zoster 09/11/2010   Screening Tests Health Maintenance  Topic Date Due  . Hepatitis C Screening  December 13, 1945  . FOOT EXAM  11/20/1956  . HEMOGLOBIN A1C  11/20/2016  . OPHTHALMOLOGY EXAM  01/18/2017  . MAMMOGRAM  12/27/2017  . TETANUS/TDAP  06/07/2018  . COLONOSCOPY  02/24/2021  . INFLUENZA VACCINE  Completed    . DEXA SCAN  Completed  . PNA vac Low Risk Adult  Completed      Plan:  I have personally reviewed and addressed the Medicare Annual Wellness questionnaire and have noted the following in the patient's chart:  A. Medical and social history B. Use of alcohol, tobacco or illicit drugs  C. Current medications and supplements D. Functional ability and status E.  Nutritional status F.  Physical activity G. Advance directives H. List of other physicians I.  Hospitalizations, surgeries, and ER visits in previous 12 months J.  [place] such as hearing and vision if needed, cognitive and depression L. Referrals and appointments - none  In addition, I have reviewed and discussed with patient certain preventive protocols, quality metrics, and best practice recommendations. A written personalized care plan for preventive services as well as general preventive health recommendations were provided to patient.  See attached scanned questionnaire for additional information.   Signed,  Fabio Neighbors, LPN Nurse Health Advisor   MD Recommendations: Pt needs a diabetic foot exam and Hepatitis C blood test done at next OV. Pt would like all blood work done together.

## 2016-11-15 NOTE — Patient Instructions (Signed)
[name] , Thank you for taking time to come for your Medicare Wellness Visit. I appreciate your ongoing commitment to your health goals. Please review the following plan we discussed and let me know if I can assist you in the future.   Screening recommendations/referrals: Colonoscopy: Up to date Mammogram: Up to date Bone Density: Up to date Recommended yearly ophthalmology/optometry visit for glaucoma screening and checkup Recommended yearly dental visit for hygiene and checkup  Vaccinations: Influenza vaccine: completed Pneumococcal vaccine: completed series Tdap vaccine: completed Shingles vaccine: completed  Advanced directives: Please bring a copy of your POA (Power of Attorney) and/or Living Will to your next appointment.   Conditions/risks identified: Fall risk prevention, Obesity - recommend cutting back on the amount of coffee and substituting with water to drink at least 4-6 glasses a day.   Next appointment: None- pt declined and will call back to schedule an apt with the PCP.   Preventive Care 70 Years and Older, Female Preventive care refers to lifestyle choices and visits with your health care provider that can promote health and wellness. What does preventive care include?  A yearly physical exam. This is also called an annual well check.  Dental exams once or twice a year.  Routine eye exams. Ask your health care provider how often you should have your eyes checked.  Personal lifestyle choices, including:  Daily care of your teeth and gums.  Regular physical activity.  Eating a healthy diet.  Avoiding tobacco and drug use.  Limiting alcohol use.  Practicing safe sex.  Taking low-dose aspirin every day.  Taking vitamin and mineral supplements as recommended by your health care provider. What happens during an annual well check? The services and screenings done by your health care provider during your annual well check will depend on your age,  overall health, lifestyle risk factors, and family history of disease. Counseling  Your health care provider may ask you questions about your:  Alcohol use.  Tobacco use.  Drug use.  Emotional well-being.  Home and relationship well-being.  Sexual activity.  Eating habits.  History of falls.  Memory and ability to understand (cognition).  Work and work Statistician.  Reproductive health. Screening  You may have the following tests or measurements:  Height, weight, and BMI.  Blood pressure.  Lipid and cholesterol levels. These may be checked every 5 years, or more frequently if you are over 70 years old.  Skin check.  Lung cancer screening. You may have this screening every year starting at age 55 if you have a 30-pack-year history of smoking and currently smoke or have quit within the past 15 years.  Fecal occult blood test (FOBT) of the stool. You may have this test every year starting at age 70.  Flexible sigmoidoscopy or colonoscopy. You may have a sigmoidoscopy every 5 years or a colonoscopy every 10 years starting at age 70.  Hepatitis C blood test.  Hepatitis B blood test.  Sexually transmitted disease (STD) testing.  Diabetes screening. This is done by checking your blood sugar (glucose) after you have not eaten for a while (fasting). You may have this done every 1-3 years.  Bone density scan. This is done to screen for osteoporosis. You may have this done starting at age 70.  Mammogram. This may be done every 1-2 years. Talk to your health care provider about how often you should have regular mammograms. Talk with your health care provider about your test results, treatment options, and if necessary,  the need for more tests. Vaccines  Your health care provider may recommend certain vaccines, such as:  Influenza vaccine. This is recommended every year.  Tetanus, diphtheria, and acellular pertussis (Tdap, Td) vaccine. You may need a Td booster every 10  years.  Zoster vaccine. You may need this after age 66.  Pneumococcal 13-valent conjugate (PCV13) vaccine. One dose is recommended after age 8.  Pneumococcal polysaccharide (PPSV23) vaccine. One dose is recommended after age 70. Talk to your health care provider about which screenings and vaccines you need and how often you need them. This information is not intended to replace advice given to you by your health care provider. Make sure you discuss any questions you have with your health care provider. Document Released: 01/23/2015 Document Revised: 09/16/2015 Document Reviewed: 10/28/2014 Elsevier Interactive Patient Education  2017 [place] Prevention in the Home Falls can cause injuries. They can happen to people of all ages. There are many things you can do to make your home safe and to help prevent falls. What can I do on the outside of my home?  Regularly fix the edges of walkways and driveways and fix any cracks.  Remove anything that might make you trip as you walk through a door, such as a raised step or threshold.  Trim any bushes or trees on the path to your home.  Use bright outdoor lighting.  Clear any walking paths of anything that might make someone trip, such as rocks or tools.  Regularly check to see if handrails are loose or broken. Make sure that both sides of any steps have handrails.  Any raised decks and porches should have guardrails on the edges.  Have any leaves, snow, or ice cleared regularly.  Use sand or salt on walking paths during winter.  Clean up any spills in your garage right away. This includes oil or grease spills. What can I do in the bathroom?  Use night lights.  Install grab bars by the toilet and in the tub and shower. Do not use towel bars as grab bars.  Use non-skid mats or decals in the tub or shower.  If you need to sit down in the shower, use a plastic, non-slip stool.  Keep the floor dry. Clean up any water that  spills on the floor as soon as it happens.  Remove soap buildup in the tub or shower regularly.  Attach bath mats securely with double-sided non-slip rug tape.  Do not have throw rugs and other things on the floor that can make you trip. What can I do in the bedroom?  Use night lights.  Make sure that you have a light by your bed that is easy to reach.  Do not use any sheets or blankets that are too big for your bed. They should not hang down onto the floor.  Have a firm chair that has side arms. You can use this for support while you get dressed.  Do not have throw rugs and other things on the floor that can make you trip. What can I do in the kitchen?  Clean up any spills right away.  Avoid walking on wet floors.  Keep items that you use a lot in easy-to-reach places.  If you need to reach something above you, use a strong step stool that has a grab bar.  Keep electrical cords out of the way.  Do not use floor polish or wax that makes floors slippery. If you must use  wax, use non-skid floor wax.  Do not have throw rugs and other things on the floor that can make you trip. What can I do with my stairs?  Do not leave any items on the stairs.  Make sure that there are handrails on both sides of the stairs and use them. Fix handrails that are broken or loose. Make sure that handrails are as long as the stairways.  Check any carpeting to make sure that it is firmly attached to the stairs. Fix any carpet that is loose or worn.  Avoid having throw rugs at the top or bottom of the stairs. If you do have throw rugs, attach them to the floor with carpet tape.  Make sure that you have a light switch at the top of the stairs and the bottom of the stairs. If you do not have them, ask someone to add them for you. What else can I do to help prevent falls?  Wear shoes that:  Do not have high heels.  Have rubber bottoms.  Are comfortable and fit you well.  Are closed at the  toe. Do not wear sandals.  If you use a stepladder:  Make sure that it is fully opened. Do not climb a closed stepladder.  Make sure that both sides of the stepladder are locked into place.  Ask someone to hold it for you, if possible.  Clearly mark and make sure that you can see:  Any grab bars or handrails.  First and last steps.  Where the edge of each step is.  Use tools that help you move around (mobility aids) if they are needed. These include:  Canes.  Walkers.  Scooters.  Crutches.  Turn on the lights when you go into a dark area. Replace any light bulbs as soon as they burn out.  Set up your furniture so you have a clear path. Avoid moving your furniture around.  If any of your floors are uneven, fix them.  If there are any pets around you, be aware of where they are.  Review your medicines with your doctor. Some medicines can make you feel dizzy. This can increase your chance of falling. Ask your doctor what other things that you can do to help prevent falls. This information is not intended to replace advice given to you by your health care provider. Make sure you discuss any questions you have with your health care provider. Document Released: 10/23/2008 Document Revised: 06/04/2015 Document Reviewed: 01/31/2014 Elsevier Interactive Patient Education  2017 [employer].

## 2016-11-28 DIAGNOSIS — Z96652 Presence of left artificial knee joint: Secondary | ICD-10-CM | POA: Diagnosis not present

## 2016-12-06 ENCOUNTER — Other Ambulatory Visit: Payer: Self-pay | Admitting: Family Medicine

## 2016-12-13 ENCOUNTER — Other Ambulatory Visit: Payer: Self-pay | Admitting: Family Medicine

## 2016-12-13 NOTE — Telephone Encounter (Signed)
She is overdue for follow up o.v. Of diabetes, needs to schedule before refill can be approved.

## 2016-12-13 NOTE — Telephone Encounter (Signed)
Was seen by [facility] on 11/15/2016. Last F/U visit was in 05/2016. Please review. Thanks!

## 2016-12-13 NOTE — Telephone Encounter (Signed)
[facility] faxed a refill request for the following medication. Thanks CC  rosuvastatin (CRESTOR) 20 MG tablet

## 2016-12-14 NOTE — Telephone Encounter (Signed)
Left message to call back  

## 2016-12-15 NOTE — Telephone Encounter (Signed)
Patient was advised and scheduled ov for 11/23/2016 at 11:30 am.

## 2016-12-23 ENCOUNTER — Ambulatory Visit: Payer: PPO | Admitting: Family Medicine

## 2016-12-23 ENCOUNTER — Encounter: Payer: Self-pay | Admitting: Family Medicine

## 2016-12-23 VITALS — BP 136/72 | HR 64 | Temp 99.0°F | Resp 16 | Wt 216.0 lb

## 2016-12-23 DIAGNOSIS — E119 Type 2 diabetes mellitus without complications: Secondary | ICD-10-CM | POA: Diagnosis not present

## 2016-12-23 DIAGNOSIS — E7800 Pure hypercholesterolemia, unspecified: Secondary | ICD-10-CM

## 2016-12-23 DIAGNOSIS — E2839 Other primary ovarian failure: Secondary | ICD-10-CM

## 2016-12-23 DIAGNOSIS — E039 Hypothyroidism, unspecified: Secondary | ICD-10-CM | POA: Diagnosis not present

## 2016-12-23 DIAGNOSIS — I10 Essential (primary) hypertension: Secondary | ICD-10-CM

## 2016-12-23 NOTE — Progress Notes (Signed)
Patient: Samantha Henry Female    DOB: Jul 03, 1945   71 y.o.   MRN: [id number] Visit Date: 12/23/2016  Today's Provider: Lelon Huh, MD   Chief Complaint  Patient presents with  . Diabetes  . Hypertension  . Hypothyroidism   Subjective:    HPI   Diabetes Mellitus Type II, Follow-up:   Lab Results  Component Value Date   HGBA1C 7.5 (H) 05/20/2016   HGBA1C 7.4 01/21/2016   HGBA1C 7.1 07/24/2015   Last seen for diabetes 7 months ago.  Management since then includes; no changes. She reports good compliance with treatment. She is not having side effects.  Current symptoms include none and have been stable. Home blood sugar records: trend: stable  Episodes of hypoglycemia? no   Most Recent Eye Exam: due  Weight trend: stable Prior visit with dietician: no Current diet: well balanced Current exercise: none    Hypertension, follow-up:  BP Readings from Last 3 Encounters:  12/23/16 136/72  11/15/16 (!) 148/68  06/15/16 (!) 154/61    She was last seen for hypertension 7 months ago.  BP at that visit was 122/70. Management since that visit includes; labs checked, no changes.She reports good compliance with treatment. She is not having side effects.  She is not exercising. She is adherent to low salt diet.   Outside blood pressures are checked occasionally. She is experiencing none.  Patient denies exertional chest pressure/discomfort, lower extremity edema and palpitations.   Cardiovascular risk factors include dyslipidemia and hypertension.    Hypothyroidism, unspecified type From 05/20/2016-labs checked, no changes were made. Tsh at that time was 0.175      Allergies  Allergen Reactions  . Aspirin     AVOIDs due to history of Von Willebrands  . Lyrica [Pregabalin] Other (See Comments)    Dizziness  . Tetracycline      Current Outpatient Medications:  .  acetaminophen (TYLENOL) 650 MG CR tablet, Take 650 mg by mouth every 8 (eight)  hours as needed for pain., Disp: , Rfl:  .  albuterol (PROVENTIL HFA;VENTOLIN HFA) 108 (90 Base) MCG/ACT inhaler, Inhale 2 puffs into the lungs every 6 (six) hours as needed for wheezing or shortness of breath., Disp: , Rfl:  .  aspirin 81 MG tablet, Take 1 tablet by mouth daily., Disp: , Rfl:  .  budesonide-formoterol (SYMBICORT) 160-4.5 MCG/ACT inhaler, Inhale 2 puffs into the lungs 2 (two) times daily., Disp: , Rfl:  .  calcium carbonate (CALCIUM 600) 600 MG TABS tablet, Take 600 mg 2 (two) times daily with a meal by mouth., Disp: , Rfl:  .  calcium carbonate (TUMS EX) 750 MG chewable tablet, Chew 2 tablets by mouth 2 (two) times daily as needed for heartburn., Disp: , Rfl:  .  cholecalciferol (VITAMIN D) 400 units TABS tablet, Take 400 Units by mouth daily., Disp: , Rfl:  .  FLUZONE HIGH-DOSE 0.5 ML injection, , Disp: , Rfl:  .  furosemide (LASIX) 20 MG tablet, Take 1 tablet (20 mg total) by mouth as needed., Disp: 30 tablet, Rfl: 5 .  levothyroxine (SYNTHROID, LEVOTHROID) 125 MCG tablet, TAKE ONE TABLET BY MOUTH ONCE DAILY BEFORE BREAKFAST, Disp: 90 tablet, Rfl: 0 .  losartan (COZAAR) 50 MG tablet, TAKE ONE TABLET BY MOUTH ONCE DAILY, Disp: 30 tablet, Rfl: 12 .  metFORMIN (GLUCOPHAGE-XR) 500 MG 24 hr tablet, TAKE TWO TABLETS BY MOUTH IN THE MORNING AND TWO IN THE EVENING, Disp: 360 tablet, Rfl: 4 .  mometasone-formoterol (DULERA) 200-5 MCG/ACT AERO, Inhale 2 puffs 2 (two) times daily into the lungs., Disp: , Rfl:  .  MULTIPLE VITAMIN PO, Take 1 tablet by mouth daily., Disp: , Rfl:  .  Omega-3 Fatty Acids (FISH OIL BURP-LESS) 1000 MG CAPS, Take 1 tablet by mouth 2 (two) times daily. , Disp: , Rfl:  .  ONE TOUCH ULTRA TEST test strip, USE TO CHECK BLOOD GLUCOSE ONCE DAILY, Disp: 50 each, Rfl: 9 .  oxyCODONE (OXY IR/ROXICODONE) 5 MG immediate release tablet, Take 1-2 tablets (5-10 mg total) by mouth every 4 (four) hours as needed for breakthrough pain., Disp: 60 tablet, Rfl: 0 .  oxymetazoline  (AFRIN) 0.05 % nasal spray, Place 2 sprays into both nostrils at bedtime., Disp: , Rfl:  .  polyethylene glycol (MIRALAX / GLYCOLAX) packet, Take 17 g by mouth daily as needed., Disp: , Rfl:  .  pseudoephedrine-dextromethorphan-guaifenesin (ROBITUSSIN-PE) 30-10-100 MG/5ML solution, Take 10 mLs by mouth 4 (four) times daily as needed for cough., Disp: , Rfl:  .  RABEprazole (ACIPHEX) 20 MG tablet, TAKE ONE TABLET BY MOUTH ONCE DAILY, Disp: 30 tablet, Rfl: 12 .  rosuvastatin (CRESTOR) 20 MG tablet, TAKE 1 TABLET BY MOUTH ONCE DAILY. NEEDS TO SCHEDULE APPOINTMENT FOR FOLLOW UP., Disp: 30 tablet, Rfl: 0 .  tiotropium (SPIRIVA HANDIHALER) 18 MCG inhalation capsule, Place 1 Inhaler into inhaler and inhale daily., Disp: , Rfl:  .  Tiotropium Bromide Monohydrate (SPIRIVA RESPIMAT) 1.25 MCG/ACT AERS, Inhale 1 spray into the lungs every morning., Disp: , Rfl:  .  trolamine salicylate (ASPERCREME) 10 % cream, Apply 1 application topically as needed for muscle pain., Disp: , Rfl:  .  vitamin B-12 (CYANOCOBALAMIN) 50 MCG tablet, Take 50 mcg by mouth daily., Disp: , Rfl:   Review of Systems  Constitutional: Negative for appetite change, chills, fatigue and fever.  Respiratory: Negative for chest tightness and shortness of breath.   Cardiovascular: Negative for chest pain and palpitations.  Gastrointestinal: Negative for abdominal pain, nausea and vomiting.  Neurological: Negative for dizziness and weakness.    Social History   Tobacco Use  . Smoking status: Former Smoker    Packs/day: 1.50    Types: Cigarettes    Last attempt to quit: 01/11/2007    Years since quitting: 9.9  . Smokeless tobacco: Never Used  Substance Use Topics  . Alcohol use: No    Alcohol/week: 0.0 oz   Objective:   BP 136/72 (BP Location: Left Arm, Patient Position: Sitting, Cuff Size: Large)   Pulse 64   Temp 99 F (37.2 C)   Resp 16   Wt 216 lb (98 kg)   BMI 34.86 kg/m  Vitals:   12/23/16 1116  BP: 136/72  Pulse: 64    Resp: 16  Temp: 99 F (37.2 C)  Weight: 216 lb (98 kg)     Physical Exam  General Appearance:    Alert, cooperative, no distress, obese  Eyes:    PERRL, conjunctiva/corneas clear, EOM's intact       Lungs:     Clear to auscultation bilaterally, respirations unlabored  Heart:    Regular rate and rhythm  Neurologic:   Awake, alert, oriented x 3. No apparent focal neurological           defect.           Assessment & Plan:     1. Type 2 diabetes mellitus without complication, without long-term current use of insulin ([facility])   2. Essential hypertension Well  controlled.  Continue current medications.    3. Hypothyroidism, unspecified type - TSH  4. Controlled type 2 diabetes mellitus without complication, without long-term current use of insulin ([facility])  - Hemoglobin A1c  5. Estrogen deficiency  - DG Bone Density; Future  6. Hypercholesteremia She is tolerating rosuvastatin well with no adverse effects.   - Lipid panel - COMPLETE METABOLIC PANEL WITH GFR       Lelon Huh, MD  [facility]

## 2016-12-24 LAB — TSH: TSH: 0.32 mIU/L — ABNORMAL LOW (ref 0.40–4.50)

## 2016-12-24 LAB — COMPLETE METABOLIC PANEL WITH GFR
AG RATIO: 1.5 (calc) (ref 1.0–2.5)
ALT: 14 U/L (ref 6–29)
AST: 20 U/L (ref 10–35)
Albumin: 4.3 g/dL (ref 3.6–5.1)
Alkaline phosphatase (APISO): 53 U/L (ref 33–130)
BILIRUBIN TOTAL: 0.4 mg/dL (ref 0.2–1.2)
BUN/Creatinine Ratio: 22 (calc) (ref 6–22)
BUN: 13 mg/dL (ref 7–25)
CALCIUM: 9.7 mg/dL (ref 8.6–10.4)
CHLORIDE: 100 mmol/L (ref 98–110)
CO2: 30 mmol/L (ref 20–32)
Creat: 0.58 mg/dL — ABNORMAL LOW (ref 0.60–0.93)
GFR, EST AFRICAN AMERICAN: 108 mL/min/{1.73_m2} (ref >=60)
GFR, EST NON AFRICAN AMERICAN: 93 mL/min/{1.73_m2} (ref >=60)
GLOBULIN: 2.8 g/dL (ref 1.9–3.7)
Glucose, Bld: 127 mg/dL — ABNORMAL HIGH (ref 65–99)
Potassium: 4.3 mmol/L (ref 3.5–5.3)
SODIUM: 137 mmol/L (ref 135–146)
Total Protein: 7.1 g/dL (ref 6.1–8.1)

## 2016-12-24 LAB — LIPID PANEL
CHOL/HDL RATIO: 2 (calc) (ref <5.0)
CHOLESTEROL: 154 mg/dL (ref <200)
HDL: 76 mg/dL (ref >50)
LDL Cholesterol (Calc): 65 mg/dL (calc)
NON-HDL CHOLESTEROL (CALC): 78 mg/dL (ref <130)
TRIGLYCERIDES: 58 mg/dL (ref <150)

## 2016-12-24 LAB — HEMOGLOBIN A1C
EAG (MMOL/L): 7.4 (calc)
HEMOGLOBIN A1C: 6.3 %{Hb} — AB (ref <5.7)
MEAN PLASMA GLUCOSE: 134 (calc)

## 2016-12-30 ENCOUNTER — Ambulatory Visit
Admission: RE | Admit: 2016-12-30 | Discharge: 2016-12-30 | Disposition: A | Discharge status: Home / Self Care | Payer: PPO | Source: Ambulatory Visit | Attending: Family Medicine | Admitting: Family Medicine

## 2016-12-30 DIAGNOSIS — Z1231 Encounter for screening mammogram for malignant neoplasm of breast: Secondary | ICD-10-CM | POA: Diagnosis not present

## 2017-01-04 ENCOUNTER — Other Ambulatory Visit: Payer: Self-pay | Admitting: Family Medicine

## 2017-01-12 ENCOUNTER — Other Ambulatory Visit: Payer: Self-pay | Admitting: Family Medicine

## 2017-02-08 ENCOUNTER — Other Ambulatory Visit: Payer: PPO

## 2017-02-15 DIAGNOSIS — J449 Chronic obstructive pulmonary disease, unspecified: Secondary | ICD-10-CM | POA: Diagnosis not present

## 2017-02-15 DIAGNOSIS — R0609 Other forms of dyspnea: Secondary | ICD-10-CM | POA: Diagnosis not present

## 2017-02-15 DIAGNOSIS — G8929 Other chronic pain: Secondary | ICD-10-CM | POA: Diagnosis not present

## 2017-02-15 DIAGNOSIS — M5135 Other intervertebral disc degeneration, thoracolumbar region: Secondary | ICD-10-CM | POA: Diagnosis not present

## 2017-02-15 DIAGNOSIS — M546 Pain in thoracic spine: Secondary | ICD-10-CM | POA: Diagnosis not present

## 2017-02-15 DIAGNOSIS — R05 Cough: Secondary | ICD-10-CM | POA: Diagnosis not present

## 2017-02-19 ENCOUNTER — Other Ambulatory Visit: Payer: Self-pay | Admitting: Family Medicine

## 2017-02-23 ENCOUNTER — Ambulatory Visit
Admission: RE | Admit: 2017-02-23 | Discharge: 2017-02-23 | Disposition: A | Discharge status: Home / Self Care | Payer: PPO | Source: Ambulatory Visit | Attending: Family Medicine | Admitting: Family Medicine

## 2017-02-23 DIAGNOSIS — M85852 Other specified disorders of bone density and structure, left thigh: Secondary | ICD-10-CM | POA: Insufficient documentation

## 2017-02-23 DIAGNOSIS — E2839 Other primary ovarian failure: Secondary | ICD-10-CM | POA: Insufficient documentation

## 2017-02-23 DIAGNOSIS — M8589 Other specified disorders of bone density and structure, multiple sites: Secondary | ICD-10-CM | POA: Diagnosis not present

## 2017-02-23 DIAGNOSIS — M8588 Other specified disorders of bone density and structure, other site: Secondary | ICD-10-CM | POA: Insufficient documentation

## 2017-02-26 ENCOUNTER — Other Ambulatory Visit: Payer: Self-pay | Admitting: Family Medicine

## 2017-02-27 ENCOUNTER — Telehealth: Payer: Self-pay

## 2017-02-27 NOTE — Telephone Encounter (Signed)
-----   Message from Birdie Sons, MD sent at 02/24/2017  8:02 AM EST ----- Bone density test shows osteopenia, but not osteoporosis. Need to repeat in 3 years.

## 2017-02-27 NOTE — Telephone Encounter (Signed)
Patient advised.KW 

## 2017-03-06 ENCOUNTER — Encounter: Payer: Self-pay | Admitting: Family Medicine

## 2017-03-06 ENCOUNTER — Ambulatory Visit (INDEPENDENT_AMBULATORY_CARE_PROVIDER_SITE_OTHER): Payer: PPO | Admitting: Family Medicine

## 2017-03-06 VITALS — BP 122/70 | HR 67 | Temp 97.9°F | Resp 16 | Wt 218.0 lb

## 2017-03-06 DIAGNOSIS — M546 Pain in thoracic spine: Secondary | ICD-10-CM | POA: Diagnosis not present

## 2017-03-06 DIAGNOSIS — N390 Urinary tract infection, site not specified: Secondary | ICD-10-CM

## 2017-03-06 DIAGNOSIS — R319 Hematuria, unspecified: Secondary | ICD-10-CM

## 2017-03-06 LAB — POCT URINALYSIS DIPSTICK
BILIRUBIN UA: NEGATIVE
GLUCOSE UA: NEGATIVE
Ketones, UA: NEGATIVE
Nitrite, UA: NEGATIVE
ODOR: NORMAL
PH UA: 7 (ref 5.0–8.0)
Protein, UA: NEGATIVE
Spec Grav, UA: 1.015 (ref 1.010–1.025)
UROBILINOGEN UA: 0.2 U/dL

## 2017-03-06 MED ORDER — NAPROXEN 375 MG PO TABS: 375.0000 mg | ORAL_TABLET | Freq: Two times a day (BID) | ORAL | 0 refills | Status: DC

## 2017-03-06 MED ORDER — CYCLOBENZAPRINE HCL 5 MG PO TABS: 5.0000 mg | ORAL_TABLET | Freq: Three times a day (TID) | ORAL | 0 refills | Status: DC | PRN

## 2017-03-06 NOTE — Progress Notes (Signed)
Patient: Samantha Henry Female    DOB: 06-26-1945   72 y.o.   MRN: [id number] Visit Date: 03/06/2017  Today's Provider: Lelon Huh, MD   Chief Complaint  Patient presents with  . Back Pain    x 2 weeks   Subjective:    Back Pain  This is a new problem. Episode onset: 2 weeks ago. The problem occurs intermittently. The problem has been gradually worsening since onset. Pain location: upper back. The quality of the pain is described as aching. Exacerbated by: movement. Pertinent negatives include no abdominal pain, chest pain, fever or weakness.  Patient states she was recently treated for a bladder infection by Dr. Raul Del on 02/15/2017 when she was having polyuria which has resolved. Patient was prescribed Cipro and symptoms did resolved, but subsequently developed back pain.       Allergies  Allergen Reactions  . Aspirin     AVOIDs due to history of Von Willebrands  . Lyrica [Pregabalin] Other (See Comments)    Dizziness  . Tetracycline      Current Outpatient Medications:  .  acetaminophen (TYLENOL) 650 MG CR tablet, Take 650 mg by mouth every 8 (eight) hours as needed for pain., Disp: , Rfl:  .  albuterol (PROVENTIL HFA;VENTOLIN HFA) 108 (90 Base) MCG/ACT inhaler, Inhale 2 puffs into the lungs every 6 (six) hours as needed for wheezing or shortness of breath., Disp: , Rfl:  .  aspirin 81 MG tablet, Take 1 tablet by mouth daily., Disp: , Rfl:  .  budesonide-formoterol (SYMBICORT) 160-4.5 MCG/ACT inhaler, Inhale 2 puffs into the lungs 2 (two) times daily., Disp: , Rfl:  .  calcium carbonate (CALCIUM 600) 600 MG TABS tablet, Take 600 mg 2 (two) times daily with a meal by mouth., Disp: , Rfl:  .  calcium carbonate (TUMS EX) 750 MG chewable tablet, Chew 2 tablets by mouth 2 (two) times daily as needed for heartburn., Disp: , Rfl:  .  cholecalciferol (VITAMIN D) 400 units TABS tablet, Take 400 Units by mouth daily., Disp: , Rfl:  .  furosemide (LASIX) 20 MG tablet, Take  1 tablet (20 mg total) by mouth as needed., Disp: 30 tablet, Rfl: 5 .  levothyroxine (SYNTHROID, LEVOTHROID) 125 MCG tablet, Take 1 tablet (125 mcg total) by mouth daily before breakfast., Disp: 90 tablet, Rfl: 2 .  losartan (COZAAR) 50 MG tablet, TAKE ONE TABLET BY MOUTH ONCE DAILY, Disp: 30 tablet, Rfl: 11 .  metFORMIN (GLUCOPHAGE-XR) 500 MG 24 hr tablet, TAKE TWO TABLETS BY MOUTH IN THE MORNING AND TWO TABLETS IN THE EVENING, Disp: 360 tablet, Rfl: 4 .  mometasone-formoterol (DULERA) 200-5 MCG/ACT AERO, Inhale 2 puffs 2 (two) times daily into the lungs., Disp: , Rfl:  .  MULTIPLE VITAMIN PO, Take 1 tablet by mouth daily., Disp: , Rfl:  .  Omega-3 Fatty Acids (FISH OIL BURP-LESS) 1000 MG CAPS, Take 1 tablet by mouth 2 (two) times daily. , Disp: , Rfl:  .  ONE TOUCH ULTRA TEST test strip, USE TO CHECK GLUCOSE ONCE DAILY, Disp: 100 each, Rfl: 4 .  oxymetazoline (AFRIN) 0.05 % nasal spray, Place 2 sprays into both nostrils at bedtime., Disp: , Rfl:  .  polyethylene glycol (MIRALAX / GLYCOLAX) packet, Take 17 g by mouth daily as needed., Disp: , Rfl:  .  RABEprazole (ACIPHEX) 20 MG tablet, TAKE ONE TABLET BY MOUTH ONCE DAILY, Disp: 30 tablet, Rfl: 12 .  rosuvastatin (CRESTOR) 20 MG tablet, Take  1 tablet (20 mg total) by mouth daily., Disp: 90 tablet, Rfl: 3 .  tiotropium (SPIRIVA HANDIHALER) 18 MCG inhalation capsule, Place 1 Inhaler into inhaler and inhale daily., Disp: , Rfl:  .  Tiotropium Bromide Monohydrate (SPIRIVA RESPIMAT) 1.25 MCG/ACT AERS, Inhale 1 spray into the lungs every morning., Disp: , Rfl:  .  trolamine salicylate (ASPERCREME) 10 % cream, Apply 1 application topically as needed for muscle pain., Disp: , Rfl:  .  vitamin B-12 (CYANOCOBALAMIN) 50 MCG tablet, Take 50 mcg by mouth daily., Disp: , Rfl:  .  oxyCODONE (OXY IR/ROXICODONE) 5 MG immediate release tablet, Take 1-2 tablets (5-10 mg total) by mouth every 4 (four) hours as needed for breakthrough pain. (Patient not taking: Reported  on 03/06/2017), Disp: 60 tablet, Rfl: 0 .  pseudoephedrine-dextromethorphan-guaifenesin (ROBITUSSIN-PE) 30-10-100 MG/5ML solution, Take 10 mLs by mouth 4 (four) times daily as needed for cough., Disp: , Rfl:   Review of Systems  Constitutional: Negative for appetite change, chills, fatigue and fever.  Respiratory: Negative for chest tightness and shortness of breath.   Cardiovascular: Negative for chest pain and palpitations.  Gastrointestinal: Negative for abdominal pain, nausea and vomiting.  Musculoskeletal: Positive for back pain (upper back ).  Neurological: Negative for dizziness and weakness.    Social History   Tobacco Use  . Smoking status: Former Smoker    Packs/day: 1.50    Types: Cigarettes    Last attempt to quit: 01/11/2007    Years since quitting: 10.1  . Smokeless tobacco: Never Used  Substance Use Topics  . Alcohol use: No    Alcohol/week: 0.0 oz   Objective:   BP 122/70 (BP Location: Left Arm, Patient Position: Sitting, Cuff Size: Large)   Pulse 67   Temp 97.9 F (36.6 C) (Oral)   Resp 16   Wt 218 lb (98.9 kg)   SpO2 96% Comment: room air  BMI 35.19 kg/m     Physical Exam  General appearance: alert, well developed, well nourished, cooperative and in no distress Head: Normocephalic, without obvious abnormality, atraumatic Back: Tender para-thoracic muscles. No clear CVA tenderness.   Results for orders placed or performed in visit on 03/06/17  POCT Urinalysis Dipstick  Result Value Ref Range   Color, UA yellow    Clarity, UA clear    Glucose, UA negative    Bilirubin, UA negative    Ketones, UA negative    Spec Grav, UA 1.015 1.010 - 1.025   Blood, UA Trace (non-hemolyzed)    pH, UA 7.0 5.0 - 8.0   Protein, UA negative    Urobilinogen, UA 0.2 0.2 or 1.0 E.U./dL   Nitrite, UA negative    Leukocytes, UA Moderate (2+) (A) Negative   Appearance clear    Odor normal        Assessment & Plan:     1. Acute bilateral thoracic back pain  -  cyclobenzaprine (FLEXERIL) 5 MG tablet; Take 1 tablet (5 mg total) by mouth 3 (three) times daily as needed for up to 10 days for muscle spasms.  Dispense: 30 tablet; Refill: 0 - naproxen (NAPROSYN) 375 MG tablet; Take 1 tablet (375 mg total) by mouth 2 (two) times daily with a meal for 10 days.  Dispense: 30 tablet; Refill: 0  2. Urinary tract infection with hematuria, site unspecified Recently completed course of ciprofloxacin, will wait for cultures before prescribing another antibiotic.  - POCT Urinalysis Dipstick - Urine Culture       Lelon Huh, MD  [facility]

## 2017-03-08 LAB — URINE CULTURE: Organism ID, Bacteria: NO GROWTH

## 2017-03-09 ENCOUNTER — Telehealth: Payer: Self-pay | Admitting: *Deleted

## 2017-03-09 NOTE — Telephone Encounter (Signed)
-----   Message from Birdie Sons, MD sent at 03/09/2017  8:58 AM EST ----- Urine cultures is negative. No infection. Back pain is due to strain muscles of back and should improve with muscle relaxant that was prescribed over the next 1-2 weeks.

## 2017-03-09 NOTE — Telephone Encounter (Signed)
Patient was notified of results. Expressed understanding. Patient stated that when she saw Dr. Colman Cater in the past, he told her she had diverticulitis. Patient wanted to know if you think it's possible that she is having a flare-up? Patient states her back pain goes across her back around the front to her upper abdomen. She was wondering if it could be diverticulitis? Please advise?

## 2017-03-10 NOTE — Telephone Encounter (Signed)
Advised  ED 

## 2017-03-10 NOTE — Telephone Encounter (Signed)
Diverticulitis causes pain in lower abdomen which occasionally radiates into lower back, but not where her back was hurting. I don't think her back has anything to do with diverticulitis.

## 2017-03-28 ENCOUNTER — Other Ambulatory Visit: Payer: Self-pay | Admitting: Family Medicine

## 2017-03-28 DIAGNOSIS — M546 Pain in thoracic spine: Secondary | ICD-10-CM

## 2017-03-30 DIAGNOSIS — H2513 Age-related nuclear cataract, bilateral: Secondary | ICD-10-CM | POA: Diagnosis not present

## 2017-03-30 DIAGNOSIS — E119 Type 2 diabetes mellitus without complications: Secondary | ICD-10-CM | POA: Diagnosis not present

## 2017-03-30 LAB — HM DIABETES EYE EXAM

## 2017-04-06 ENCOUNTER — Other Ambulatory Visit: Payer: Self-pay | Admitting: Family Medicine

## 2017-04-10 ENCOUNTER — Other Ambulatory Visit: Payer: Self-pay | Admitting: Family Medicine

## 2017-04-10 DIAGNOSIS — M546 Pain in thoracic spine: Secondary | ICD-10-CM

## 2017-04-10 MED ORDER — CYCLOBENZAPRINE HCL 5 MG PO TABS: ORAL_TABLET | ORAL | 3 refills | Status: DC

## 2017-04-10 NOTE — Telephone Encounter (Signed)
[facility] pharmacy faxed a refill request for the following medication. Thanks CC  cyclobenzaprine (FLEXERIL) 5 MG tablet

## 2017-05-12 ENCOUNTER — Ambulatory Visit
Admission: RE | Admit: 2017-05-12 | Discharge: 2017-05-12 | Disposition: A | Discharge status: Home / Self Care | Payer: PPO | Source: Ambulatory Visit | Attending: Family Medicine | Admitting: Family Medicine

## 2017-05-12 ENCOUNTER — Ambulatory Visit (INDEPENDENT_AMBULATORY_CARE_PROVIDER_SITE_OTHER): Payer: PPO | Admitting: Family Medicine

## 2017-05-12 ENCOUNTER — Encounter: Payer: Self-pay | Admitting: Family Medicine

## 2017-05-12 VITALS — BP 146/66 | HR 80 | Temp 98.7°F | Resp 18 | Wt 218.0 lb

## 2017-05-12 DIAGNOSIS — M546 Pain in thoracic spine: Secondary | ICD-10-CM

## 2017-05-12 DIAGNOSIS — Z87891 Personal history of nicotine dependence: Secondary | ICD-10-CM | POA: Diagnosis not present

## 2017-05-12 LAB — POCT URINALYSIS DIPSTICK
BILIRUBIN UA: NEGATIVE
Blood, UA: NEGATIVE
Glucose, UA: NEGATIVE
KETONES UA: NEGATIVE
Leukocytes, UA: NEGATIVE
Nitrite, UA: NEGATIVE
Protein, UA: NEGATIVE
Spec Grav, UA: 1.01 (ref 1.010–1.025)
Urobilinogen, UA: 0.2 E.U./dL
pH, UA: 8 (ref 5.0–8.0)

## 2017-05-12 NOTE — Patient Instructions (Signed)
   Go to the [facility] on [facility] for back [place]

## 2017-05-12 NOTE — Progress Notes (Signed)
Patient: Samantha Henry Female    DOB: 10-28-1945   72 y.o.   MRN: [id number] Visit Date: 05/12/2017  Today's Provider: Lelon Huh, MD   Chief Complaint  Patient presents with  . Back Pain   Subjective:    Back Pain  Pertinent negatives include no abdominal pain, chest pain, fever or weakness.  Pt was seen for this same back pain on 03/06/17 and was treated with flexeril and naproxen and reports that it did no help her. A urine culture and UA was also done at that time as well. She reports that she has also tried tylenol, and BC. Pt reports that the pain is mid back. She reports that movement makes it worse. Pain across central back.       Allergies  Allergen Reactions  . Aspirin     AVOIDs due to history of Von Willebrands  . Lyrica [Pregabalin] Other (See Comments)    Dizziness  . Tetracycline      Current Outpatient Medications:  .  acetaminophen (TYLENOL) 650 MG CR tablet, Take 650 mg by mouth every 8 (eight) hours as needed for pain., Disp: , Rfl:  .  albuterol (PROVENTIL HFA;VENTOLIN HFA) 108 (90 Base) MCG/ACT inhaler, Inhale 2 puffs into the lungs every 6 (six) hours as needed for wheezing or shortness of breath., Disp: , Rfl:  .  aspirin 81 MG tablet, Take 1 tablet by mouth daily., Disp: , Rfl:  .  budesonide-formoterol (SYMBICORT) 160-4.5 MCG/ACT inhaler, Inhale 2 puffs into the lungs 2 (two) times daily., Disp: , Rfl:  .  calcium carbonate (CALCIUM 600) 600 MG TABS tablet, Take 600 mg 2 (two) times daily with a meal by mouth., Disp: , Rfl:  .  calcium carbonate (TUMS EX) 750 MG chewable tablet, Chew 2 tablets by mouth 2 (two) times daily as needed for heartburn., Disp: , Rfl:  .  cholecalciferol (VITAMIN D) 400 units TABS tablet, Take 400 Units by mouth daily., Disp: , Rfl:  .  cyclobenzaprine (FLEXERIL) 5 MG tablet, TAKE 1 TABLET BY MOUTH THREE TIMES DAILY AS NEEDED FOR  UP  TO  10  DAYS  FOR  MUSCLE  SPASMS, Disp: 90 tablet, Rfl: 3 .  furosemide (LASIX) 20  MG tablet, Take 1 tablet (20 mg total) by mouth as needed., Disp: 30 tablet, Rfl: 5 .  levothyroxine (SYNTHROID, LEVOTHROID) 125 MCG tablet, Take 1 tablet (125 mcg total) by mouth daily before breakfast., Disp: 90 tablet, Rfl: 2 .  losartan (COZAAR) 50 MG tablet, TAKE ONE TABLET BY MOUTH ONCE DAILY, Disp: 30 tablet, Rfl: 11 .  metFORMIN (GLUCOPHAGE-XR) 500 MG 24 hr tablet, TAKE TWO TABLETS BY MOUTH IN THE MORNING AND TWO TABLETS IN THE EVENING, Disp: 360 tablet, Rfl: 4 .  MULTIPLE VITAMIN PO, Take 1 tablet by mouth daily., Disp: , Rfl:  .  naproxen (NAPROSYN) 375 MG tablet, TAKE 1 TABLET BY MOUTH TWICE DAILY WITH A MEAL FOR 10 DAYS, Disp: 60 tablet, Rfl: 3 .  Omega-3 Fatty Acids (FISH OIL BURP-LESS) 1000 MG CAPS, Take 1 tablet by mouth 2 (two) times daily. , Disp: , Rfl:  .  ONE TOUCH ULTRA TEST test strip, USE TO CHECK GLUCOSE ONCE DAILY, Disp: 100 each, Rfl: 4 .  polyethylene glycol (MIRALAX / GLYCOLAX) packet, Take 17 g by mouth daily as needed., Disp: , Rfl:  .  pseudoephedrine-dextromethorphan-guaifenesin (ROBITUSSIN-PE) 30-10-100 MG/5ML solution, Take 10 mLs by mouth 4 (four) times daily as needed for  cough., Disp: , Rfl:  .  RABEprazole (ACIPHEX) 20 MG tablet, TAKE 1 TABLET BY MOUTH ONCE DAILY, Disp: 30 tablet, Rfl: 12 .  rosuvastatin (CRESTOR) 20 MG tablet, Take 1 tablet (20 mg total) by mouth daily., Disp: 90 tablet, Rfl: 3 .  Tiotropium Bromide Monohydrate (SPIRIVA RESPIMAT) 1.25 MCG/ACT AERS, Inhale 1 spray into the lungs every morning., Disp: , Rfl:  .  trolamine salicylate (ASPERCREME) 10 % cream, Apply 1 application topically as needed for muscle pain., Disp: , Rfl:  .  vitamin B-12 (CYANOCOBALAMIN) 50 MCG tablet, Take 50 mcg by mouth daily., Disp: , Rfl:  .  mometasone-formoterol (DULERA) 200-5 MCG/ACT AERO, Inhale 2 puffs 2 (two) times daily into the lungs., Disp: , Rfl:  .  oxyCODONE (OXY IR/ROXICODONE) 5 MG immediate release tablet, Take 1-2 tablets (5-10 mg total) by mouth every 4  (four) hours as needed for breakthrough pain. (Patient not taking: Reported on 03/06/2017), Disp: 60 tablet, Rfl: 0 .  oxymetazoline (AFRIN) 0.05 % nasal spray, Place 2 sprays into both nostrils at bedtime., Disp: , Rfl:  .  tiotropium (SPIRIVA HANDIHALER) 18 MCG inhalation capsule, Place 1 Inhaler into inhaler and inhale daily., Disp: , Rfl:   Review of Systems  Constitutional: Negative for appetite change, chills, fatigue and fever.  Respiratory: Positive for cough. Negative for chest tightness and shortness of breath.   Cardiovascular: Negative for chest pain and palpitations.  Gastrointestinal: Negative for abdominal pain, nausea and vomiting.  Musculoskeletal: Positive for back pain.  Neurological: Negative for dizziness and weakness.    Social History   Tobacco Use  . Smoking status: Former Smoker    Packs/day: 1.50    Types: Cigarettes    Last attempt to quit: 01/11/2007    Years since quitting: 10.3  . Smokeless tobacco: Never Used  Substance Use Topics  . Alcohol use: No    Alcohol/week: 0.0 oz   Objective:   BP (!) 146/66 (BP Location: Left Arm, Patient Position: Sitting, Cuff Size: Normal)   Pulse 80   Temp 98.7 F (37.1 C) (Oral)   Resp 18   Wt 218 lb (98.9 kg)   SpO2 94%   BMI 35.19 kg/m  Vitals:   05/12/17 1330  BP: (!) 146/66  Pulse: 80  Resp: 18  Temp: 98.7 F (37.1 C)  TempSrc: Oral  SpO2: 94%  Weight: 218 lb (98.9 kg)     Physical Exam  General appearance: alert, well developed, well nourished, cooperative and in no distress Head: Normocephalic, without obvious abnormality, atraumatic Back: Tender upper and mid para-thoracic muscles and thoracic spine. No gross deformities.No other trigger point tenderness. No clear CVA tenderness.   Results for orders placed or performed in visit on 05/12/17  POCT urinalysis dipstick  Result Value Ref Range   Color, UA yellow    Clarity, UA clear    Glucose, UA neg    Bilirubin, UA neg    Ketones, UA neg      Spec Grav, UA 1.010 1.010 - 1.025   Blood, UA neg    pH, UA 8.0 5.0 - 8.0   Protein, UA neg    Urobilinogen, UA 0.2 0.2 or 1.0 E.U./dL   Nitrite, UA neg    Leukocytes, UA Negative Negative   Appearance     Odor         Assessment & Plan:     1. Acute bilateral thoracic back pain No improvement with muscles relaxers and antiinflammatory medications. Check xray, consider PT.  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 History of smoking 30 or more pack years Counseled on recommendations for lung cancer screening as she has long smoking history has been less than 15 years since she quit smoking.  - Ambulatory Referral for Lung Cancer Scre  Addressed extensive list of chronic and acute medical problems today requiring extensive time in counseling and coordination of care.  Over half of this 25 minute visit were spent in counseling and coordinating care of multiple medical problems.       Lelon Huh, MD  [facility]

## 2017-05-15 ENCOUNTER — Telehealth: Payer: Self-pay

## 2017-05-15 DIAGNOSIS — M546 Pain in thoracic spine: Secondary | ICD-10-CM

## 2017-05-15 NOTE — Telephone Encounter (Signed)
-----   Message from Birdie Sons, MD sent at 05/15/2017  8:03 AM EDT ----- Odette Horns of back I normal. Recommend referral to orthopedics for persistent thoracic back pain.

## 2017-05-15 NOTE — Telephone Encounter (Signed)
Pt advised.  She agreed to proceed with the referral.   Thanks,   -Mickel Baas

## 2017-05-15 NOTE — Telephone Encounter (Signed)
LMTCB 05/15/2017  Thanks,   -Mickel Baas

## 2017-05-17 ENCOUNTER — Telehealth: Payer: Self-pay | Admitting: *Deleted

## 2017-05-17 DIAGNOSIS — Z122 Encounter for screening for malignant neoplasm of respiratory organs: Secondary | ICD-10-CM

## 2017-05-17 DIAGNOSIS — Z87891 Personal history of nicotine dependence: Secondary | ICD-10-CM

## 2017-05-17 NOTE — Telephone Encounter (Signed)
Received referral for initial lung cancer screening scan. Contacted patient and obtained smoking history,(former, quit 2009, 72 pack year) as well as answering questions related to screening process. Patient denies signs of lung cancer such as weight loss or hemoptysis. Patient denies comorbidity that would prevent curative treatment if lung cancer were found. Patient is scheduled for shared decision making visit and CT scan on 06/01/17.

## 2017-05-19 DIAGNOSIS — J449 Chronic obstructive pulmonary disease, unspecified: Secondary | ICD-10-CM | POA: Diagnosis not present

## 2017-06-01 ENCOUNTER — Inpatient Hospital Stay: Payer: PPO | Attending: Nurse Practitioner | Admitting: Nurse Practitioner

## 2017-06-01 ENCOUNTER — Encounter: Payer: Self-pay | Admitting: Nurse Practitioner

## 2017-06-01 ENCOUNTER — Ambulatory Visit
Admission: RE | Admit: 2017-06-01 | Discharge: 2017-06-01 | Disposition: A | Discharge status: Home / Self Care | Payer: PPO | Source: Ambulatory Visit | Attending: Nurse Practitioner | Admitting: Nurse Practitioner

## 2017-06-01 DIAGNOSIS — I2510 Atherosclerotic heart disease of native coronary artery without angina pectoris: Secondary | ICD-10-CM | POA: Insufficient documentation

## 2017-06-01 DIAGNOSIS — I350 Nonrheumatic aortic (valve) stenosis: Secondary | ICD-10-CM | POA: Diagnosis not present

## 2017-06-01 DIAGNOSIS — I700 Atherosclerosis of aorta: Secondary | ICD-10-CM | POA: Diagnosis not present

## 2017-06-01 DIAGNOSIS — J438 Other emphysema: Secondary | ICD-10-CM | POA: Diagnosis not present

## 2017-06-01 DIAGNOSIS — Z87891 Personal history of nicotine dependence: Secondary | ICD-10-CM | POA: Diagnosis not present

## 2017-06-01 DIAGNOSIS — Z122 Encounter for screening for malignant neoplasm of respiratory organs: Secondary | ICD-10-CM | POA: Diagnosis not present

## 2017-06-01 DIAGNOSIS — J432 Centrilobular emphysema: Secondary | ICD-10-CM | POA: Diagnosis not present

## 2017-06-01 NOTE — Progress Notes (Signed)
In accordance with CMS guidelines, patient has met eligibility criteria including age, absence of signs or symptoms of lung cancer.  Social History   Tobacco Use  . Smoking status: Former Smoker    Packs/day: 1.50    Years: 48.00    Pack years: 72.00    Types: Cigarettes    Last attempt to quit: 01/11/2007    Years since quitting: 10.3  . Smokeless tobacco: Never Used  . Tobacco comment: smoked up to 2 ppd since 72 yo. quit in 2009  Substance Use Topics  . Alcohol use: No    Alcohol/week: 0.0 oz  . Drug use: No      A shared decision-making session was conducted prior to the performance of CT scan. This includes one or more decision aids, includes benefits and harms of screening, follow-up diagnostic testing, over-diagnosis, false positive rate, and total radiation exposure.   Counseling on the importance of adherence to annual lung cancer LDCT screening, impact of co-morbidities, and ability or willingness to undergo diagnosis and treatment is imperative for compliance of the program.   Counseling on the importance of continued smoking cessation for former smokers; the importance of smoking cessation for current smokers, and information about tobacco cessation interventions have been given to patient including [place] and [facility] quit [place] programs.   Written order for lung cancer screening with LDCT has been given to the patient and any and all questions have been answered to the best of my abilities.    Yearly follow up will be coordinated by Burgess Estelle, Thoracic Navigator.  Beckey Rutter, DNP, AGNP-C [place] [facility] [phone] (work cell) [phone] (office) 06/01/17 5:09 PM

## 2017-06-02 ENCOUNTER — Encounter: Payer: Self-pay | Admitting: *Deleted

## 2017-06-12 DIAGNOSIS — G8929 Other chronic pain: Secondary | ICD-10-CM | POA: Diagnosis not present

## 2017-06-12 DIAGNOSIS — M545 Low back pain: Secondary | ICD-10-CM | POA: Diagnosis not present

## 2017-06-12 DIAGNOSIS — M47816 Spondylosis without myelopathy or radiculopathy, lumbar region: Secondary | ICD-10-CM | POA: Diagnosis not present

## 2017-06-26 ENCOUNTER — Ambulatory Visit (INDEPENDENT_AMBULATORY_CARE_PROVIDER_SITE_OTHER): Payer: PPO | Admitting: Family Medicine

## 2017-06-26 ENCOUNTER — Encounter: Payer: Self-pay | Admitting: Family Medicine

## 2017-06-26 VITALS — BP 124/62 | HR 56 | Temp 97.9°F | Resp 16 | Ht 66.0 in | Wt 219.0 lb

## 2017-06-26 DIAGNOSIS — E119 Type 2 diabetes mellitus without complications: Secondary | ICD-10-CM | POA: Diagnosis not present

## 2017-06-26 DIAGNOSIS — I10 Essential (primary) hypertension: Secondary | ICD-10-CM | POA: Diagnosis not present

## 2017-06-26 LAB — POCT GLYCOSYLATED HEMOGLOBIN (HGB A1C)
ESTIMATED AVERAGE GLUCOSE: 148
Hemoglobin A1C: 6.8 % — AB (ref 4.0–5.6)

## 2017-06-26 NOTE — Progress Notes (Signed)
Patient: Samantha Henry Female    DOB: August 10, 1945   72 y.o.   MRN: [id number] Visit Date: 06/26/2017  Today's Provider: Lelon Huh, MD   No chief complaint on file.  Subjective:    HPI   Diabetes Mellitus Type II, Follow-up:   Lab Results  Component Value Date   HGBA1C 6.3 (H) 12/23/2016   HGBA1C 7.5 (H) 05/20/2016   HGBA1C 7.4 01/21/2016   Last seen for diabetes 6 months ago.  Management since then includes; no changes. She reports good compliance with treatment. She is not having side effects. none Current symptoms include none and have been unchanged. Home blood sugar records: fasting range: 115/135  Episodes of hypoglycemia? no   Current Insulin Regimen: n/a Most Recent Eye Exam: 03/30/2017 Weight trend: stable Prior visit with dietician: no Current diet: well balanced Current exercise: none  She has been on prednisone lately for persistent back pain.  -----------------------------------------------------------------   Hypertension, follow-up:  BP Readings from Last 3 Encounters:  06/26/17 124/62  05/12/17 (!) 146/66  03/06/17 122/70    She was last seen for hypertension 6 months ago.  BP at that visit was 136/72. Management since that visit includes; labs checked, no changes.She reports good compliance with treatment. She is not having side effects. none She is not exercising. She is adherent to low salt diet.   Outside blood pressures are normal. She is experiencing none.  Patient denies none.   Cardiovascular risk factors include diabetes mellitus.  Use of agents associated with hypertension: none -----------------------------------------------------------------    Lipid/Cholesterol, Follow-up:   Last seen for this 6 months ago.  Management since that visit includes; labs checked, no changes.  Last Lipid Panel:    Component Value Date/Time   CHOL 154 12/23/2016 1141   CHOL 151 01/21/2016 1226   TRIG 58 12/23/2016 1141   HDL  76 12/23/2016 1141   HDL 63 01/21/2016 1226   CHOLHDL 2.0 12/23/2016 1141   LDLCALC 65 12/23/2016 1141    She reports good compliance with treatment. She is not having side effects. none  Wt Readings from Last 3 Encounters:  06/26/17 219 lb (99.3 kg)  06/01/17 218 lb (98.9 kg)  05/12/17 218 lb (98.9 kg)   -----------------------------------------------------------------    Allergies  Allergen Reactions  . Aspirin     AVOIDs due to history of [employer]  . Lyrica [Pregabalin] Other (See Comments)    Dizziness  . Tetracycline      Current Outpatient Medications:  .  acetaminophen (TYLENOL) 650 MG CR tablet, Take 650 mg by mouth every 8 (eight) hours as needed for pain., Disp: , Rfl:  .  albuterol (PROVENTIL HFA;VENTOLIN HFA) 108 (90 Base) MCG/ACT inhaler, Inhale 2 puffs into the lungs every 6 (six) hours as needed for wheezing or shortness of breath., Disp: , Rfl:  .  aspirin 81 MG tablet, Take 1 tablet by mouth daily., Disp: , Rfl:  .  budesonide-formoterol (SYMBICORT) 160-4.5 MCG/ACT inhaler, Inhale 2 puffs into the lungs 2 (two) times daily., Disp: , Rfl:  .  calcium carbonate (CALCIUM 600) 600 MG TABS tablet, Take 600 mg 2 (two) times daily with a meal by mouth., Disp: , Rfl:  .  calcium carbonate (TUMS EX) 750 MG chewable tablet, Chew 2 tablets by mouth 2 (two) times daily as needed for heartburn., Disp: , Rfl:  .  cholecalciferol (VITAMIN D) 400 units TABS tablet, Take 400 Units by mouth daily., Disp: , Rfl:  .  cyclobenzaprine (FLEXERIL) 5 MG tablet, TAKE 1 TABLET BY MOUTH THREE TIMES DAILY AS NEEDED FOR  UP  TO  10  DAYS  FOR  MUSCLE  SPASMS, Disp: 90 tablet, Rfl: 3 .  furosemide (LASIX) 20 MG tablet, Take 1 tablet (20 mg total) by mouth as needed., Disp: 30 tablet, Rfl: 5 .  levothyroxine (SYNTHROID, LEVOTHROID) 125 MCG tablet, Take 1 tablet (125 mcg total) by mouth daily before breakfast., Disp: 90 tablet, Rfl: 2 .  losartan (COZAAR) 50 MG tablet, TAKE ONE TABLET  BY MOUTH ONCE DAILY, Disp: 30 tablet, Rfl: 11 .  metFORMIN (GLUCOPHAGE-XR) 500 MG 24 hr tablet, TAKE TWO TABLETS BY MOUTH IN THE MORNING AND TWO TABLETS IN THE EVENING, Disp: 360 tablet, Rfl: 4 .  mometasone-formoterol (DULERA) 200-5 MCG/ACT AERO, Inhale 2 puffs 2 (two) times daily into the lungs., Disp: , Rfl:  .  MULTIPLE VITAMIN PO, Take 1 tablet by mouth daily., Disp: , Rfl:  .  naproxen (NAPROSYN) 375 MG tablet, TAKE 1 TABLET BY MOUTH TWICE DAILY WITH A MEAL FOR 10 DAYS, Disp: 60 tablet, Rfl: 3 .  Omega-3 Fatty Acids (FISH OIL BURP-LESS) 1000 MG CAPS, Take 1 tablet by mouth 2 (two) times daily. , Disp: , Rfl:  .  ONE TOUCH ULTRA TEST test strip, USE TO CHECK GLUCOSE ONCE DAILY, Disp: 100 each, Rfl: 4 .  oxyCODONE (OXY IR/ROXICODONE) 5 MG immediate release tablet, Take 1-2 tablets (5-10 mg total) by mouth every 4 (four) hours as needed for breakthrough pain., Disp: 60 tablet, Rfl: 0 .  oxymetazoline (AFRIN) 0.05 % nasal spray, Place 2 sprays into both nostrils at bedtime., Disp: , Rfl:  .  polyethylene glycol (MIRALAX / GLYCOLAX) packet, Take 17 g by mouth daily as needed., Disp: , Rfl:  .  pseudoephedrine-dextromethorphan-guaifenesin (ROBITUSSIN-PE) 30-10-100 MG/5ML solution, Take 10 mLs by mouth 4 (four) times daily as needed for cough., Disp: , Rfl:  .  RABEprazole (ACIPHEX) 20 MG tablet, TAKE 1 TABLET BY MOUTH ONCE DAILY, Disp: 30 tablet, Rfl: 12 .  rosuvastatin (CRESTOR) 20 MG tablet, Take 1 tablet (20 mg total) by mouth daily., Disp: 90 tablet, Rfl: 3 .  tiotropium (SPIRIVA HANDIHALER) 18 MCG inhalation capsule, Place 1 Inhaler into inhaler and inhale daily., Disp: , Rfl:  .  Tiotropium Bromide Monohydrate (SPIRIVA RESPIMAT) 1.25 MCG/ACT AERS, Inhale 1 spray into the lungs every morning., Disp: , Rfl:  .  trolamine salicylate (ASPERCREME) 10 % cream, Apply 1 application topically as needed for muscle pain., Disp: , Rfl:  .  vitamin B-12 (CYANOCOBALAMIN) 50 MCG tablet, Take 50 mcg by mouth  daily., Disp: , Rfl:   Review of Systems  Constitutional: Negative for appetite change, chills, fatigue and fever.  Respiratory: Negative for chest tightness and shortness of breath.   Cardiovascular: Negative for chest pain and palpitations.  Gastrointestinal: Negative for abdominal pain, nausea and vomiting.  Neurological: Negative for dizziness and weakness.    Social History   Tobacco Use  . Smoking status: Former Smoker    Packs/day: 1.50    Years: 48.00    Pack years: 72.00    Types: Cigarettes    Last attempt to quit: 01/11/2007    Years since quitting: 10.4  . Smokeless tobacco: Never Used  . Tobacco comment: smoked up to 2 ppd since 72 yo. quit in 2009  Substance Use Topics  . Alcohol use: No    Alcohol/week: 0.0 oz   Objective:   BP 124/62 (BP Location: Right Arm,  Patient Position: Sitting, Cuff Size: Large)   Pulse (!) 56   Temp 97.9 F (36.6 C) (Oral)   Resp 16   Ht 5\' 6"  (1.676 m)   Wt 219 lb (99.3 kg)   SpO2 97%   BMI 35.35 kg/m  Vitals:   06/26/17 1126  BP: 124/62  Pulse: (!) 56  Resp: 16  Temp: 97.9 F (36.6 C)  TempSrc: Oral  SpO2: 97%  Weight: 219 lb (99.3 kg)  Height: 5\' 6"  (1.676 m)     Physical Exam  General Appearance:    Alert, cooperative, no distress, obese  Eyes:    PERRL, conjunctiva/corneas clear, EOM's intact       Lungs:     Clear to auscultation bilaterally, respirations unlabored  Heart:    Regular rate and rhythm  Neurologic:   Awake, alert, oriented x 3. No apparent focal neurological           defect.        Results for orders placed or performed in visit on 06/26/17  POCT glycosylated hemoglobin (Hb A1C)  Result Value Ref Range   Hemoglobin A1C 6.8 (A) 4.0 - 5.6 %   HbA1c, POC (prediabetic range)  5.7 - 6.4 %   HbA1c, POC (controlled diabetic range)  0.0 - 7.0 %   Est. average glucose Bld gHb Est-mCnc 148        Assessment & Plan:     1. Type 2 diabetes mellitus without complication, without long-term current  use of insulin (HCC) a1c trending up, but she has been oral steroids. Work on [employer]. Same medications for now.  Return in about 5 months (around 11/26/2017).  - POCT glycosylated hemoglobin (Hb A1C)  2. Morbid obesity ([place]) Counseled regarding prudent diet and regular exercise.   3. Essential hypertension Well controlled.  Continue current medications.         Lelon Huh, MD  [facility]

## 2017-07-12 DIAGNOSIS — G8929 Other chronic pain: Secondary | ICD-10-CM | POA: Diagnosis not present

## 2017-07-12 DIAGNOSIS — M545 Low back pain: Secondary | ICD-10-CM | POA: Diagnosis not present

## 2017-07-12 DIAGNOSIS — M47816 Spondylosis without myelopathy or radiculopathy, lumbar region: Secondary | ICD-10-CM | POA: Diagnosis not present

## 2017-07-17 ENCOUNTER — Other Ambulatory Visit: Payer: Self-pay | Admitting: Student

## 2017-07-17 DIAGNOSIS — M545 Low back pain: Principal | ICD-10-CM

## 2017-07-17 DIAGNOSIS — G8929 Other chronic pain: Secondary | ICD-10-CM

## 2017-07-17 DIAGNOSIS — M47816 Spondylosis without myelopathy or radiculopathy, lumbar region: Secondary | ICD-10-CM

## 2017-07-29 ENCOUNTER — Ambulatory Visit
Admission: RE | Admit: 2017-07-29 | Discharge: 2017-07-29 | Disposition: A | Discharge status: Home / Self Care | Payer: PPO | Source: Ambulatory Visit | Attending: Student | Admitting: Student

## 2017-07-29 DIAGNOSIS — M545 Low back pain, unspecified: Secondary | ICD-10-CM

## 2017-07-29 DIAGNOSIS — M5134 Other intervertebral disc degeneration, thoracic region: Secondary | ICD-10-CM | POA: Insufficient documentation

## 2017-07-29 DIAGNOSIS — G8929 Other chronic pain: Secondary | ICD-10-CM | POA: Insufficient documentation

## 2017-07-29 DIAGNOSIS — M5136 Other intervertebral disc degeneration, lumbar region: Secondary | ICD-10-CM | POA: Diagnosis not present

## 2017-07-29 DIAGNOSIS — M47816 Spondylosis without myelopathy or radiculopathy, lumbar region: Secondary | ICD-10-CM

## 2017-07-31 ENCOUNTER — Other Ambulatory Visit: Payer: Self-pay | Admitting: Family Medicine

## 2017-07-31 DIAGNOSIS — Z1231 Encounter for screening mammogram for malignant neoplasm of breast: Secondary | ICD-10-CM

## 2017-08-10 DIAGNOSIS — M48062 Spinal stenosis, lumbar region with neurogenic claudication: Secondary | ICD-10-CM | POA: Diagnosis not present

## 2017-08-10 DIAGNOSIS — M5136 Other intervertebral disc degeneration, lumbar region: Secondary | ICD-10-CM | POA: Diagnosis not present

## 2017-08-10 DIAGNOSIS — M5416 Radiculopathy, lumbar region: Secondary | ICD-10-CM | POA: Diagnosis not present

## 2017-08-30 ENCOUNTER — Other Ambulatory Visit: Payer: Self-pay | Admitting: Pharmacist

## 2017-08-30 NOTE — Patient Outreach (Signed)
[facility]) Care Management  08/30/2017  [name] 1945/11/19 [id number]  Subjective  Incoming call from Samantha Henry in response to the [facility] Medication Adherence Campaign. Speak with patient. HIPAA identifiers verified and verbal consent received.  [name] reports that she is taking her metformin ER 500 mg two tablets twice daily as directed. Denies any missed doses or barriers to adherence. Reports that she has plenty of medication at this time. Counsel patient on the importance of adherence to this medication.  [name] denies any further medication questions/concerns at this time.  Objective  A1C 6.8% (06/26/17) per Epic eGFR ~93 mL/min (12/23/16) per Epic   PLAN  Will close pharmacy episode  Harlow Asa, PharmD, [facility] [phone]

## 2017-09-12 DIAGNOSIS — M5136 Other intervertebral disc degeneration, lumbar region: Secondary | ICD-10-CM | POA: Diagnosis not present

## 2017-09-12 DIAGNOSIS — M48062 Spinal stenosis, lumbar region with neurogenic claudication: Secondary | ICD-10-CM | POA: Diagnosis not present

## 2017-09-12 DIAGNOSIS — M5416 Radiculopathy, lumbar region: Secondary | ICD-10-CM | POA: Diagnosis not present

## 2017-10-04 DIAGNOSIS — M5416 Radiculopathy, lumbar region: Secondary | ICD-10-CM | POA: Diagnosis not present

## 2017-10-04 DIAGNOSIS — M47816 Spondylosis without myelopathy or radiculopathy, lumbar region: Secondary | ICD-10-CM | POA: Diagnosis not present

## 2017-10-09 ENCOUNTER — Other Ambulatory Visit: Payer: Self-pay | Admitting: Family Medicine

## 2017-10-10 DIAGNOSIS — M47816 Spondylosis without myelopathy or radiculopathy, lumbar region: Secondary | ICD-10-CM | POA: Diagnosis not present

## 2017-10-10 DIAGNOSIS — M546 Pain in thoracic spine: Secondary | ICD-10-CM | POA: Diagnosis not present

## 2017-10-10 DIAGNOSIS — G8929 Other chronic pain: Secondary | ICD-10-CM | POA: Diagnosis not present

## 2017-10-10 DIAGNOSIS — M5136 Other intervertebral disc degeneration, lumbar region: Secondary | ICD-10-CM | POA: Diagnosis not present

## 2017-10-10 DIAGNOSIS — M79605 Pain in left leg: Secondary | ICD-10-CM | POA: Diagnosis not present

## 2017-10-10 DIAGNOSIS — M545 Low back pain: Secondary | ICD-10-CM | POA: Diagnosis not present

## 2017-10-24 DIAGNOSIS — M79605 Pain in left leg: Secondary | ICD-10-CM | POA: Diagnosis not present

## 2017-10-24 DIAGNOSIS — M545 Low back pain: Secondary | ICD-10-CM | POA: Diagnosis not present

## 2017-10-27 DIAGNOSIS — J449 Chronic obstructive pulmonary disease, unspecified: Secondary | ICD-10-CM | POA: Diagnosis not present

## 2017-10-27 DIAGNOSIS — J209 Acute bronchitis, unspecified: Secondary | ICD-10-CM | POA: Diagnosis not present

## 2017-11-07 DIAGNOSIS — G8929 Other chronic pain: Secondary | ICD-10-CM | POA: Diagnosis not present

## 2017-11-07 DIAGNOSIS — M5136 Other intervertebral disc degeneration, lumbar region: Secondary | ICD-10-CM | POA: Diagnosis not present

## 2017-11-07 DIAGNOSIS — M47816 Spondylosis without myelopathy or radiculopathy, lumbar region: Secondary | ICD-10-CM | POA: Diagnosis not present

## 2017-11-07 DIAGNOSIS — M546 Pain in thoracic spine: Secondary | ICD-10-CM | POA: Diagnosis not present

## 2017-11-16 ENCOUNTER — Ambulatory Visit (INDEPENDENT_AMBULATORY_CARE_PROVIDER_SITE_OTHER): Payer: PPO | Admitting: Family Medicine

## 2017-11-16 ENCOUNTER — Ambulatory Visit (INDEPENDENT_AMBULATORY_CARE_PROVIDER_SITE_OTHER): Payer: PPO

## 2017-11-16 ENCOUNTER — Other Ambulatory Visit: Payer: Self-pay | Admitting: Family Medicine

## 2017-11-16 ENCOUNTER — Encounter: Payer: Self-pay | Admitting: Family Medicine

## 2017-11-16 VITALS — BP 132/60 | HR 85 | Temp 98.0°F | Ht 66.0 in | Wt 216.0 lb

## 2017-11-16 VITALS — BP 132/60 | HR 85 | Temp 98.0°F | Wt 216.0 lb

## 2017-11-16 DIAGNOSIS — G4733 Obstructive sleep apnea (adult) (pediatric): Secondary | ICD-10-CM | POA: Diagnosis not present

## 2017-11-16 DIAGNOSIS — G629 Polyneuropathy, unspecified: Secondary | ICD-10-CM

## 2017-11-16 DIAGNOSIS — D519 Vitamin B12 deficiency anemia, unspecified: Secondary | ICD-10-CM | POA: Diagnosis not present

## 2017-11-16 DIAGNOSIS — E039 Hypothyroidism, unspecified: Secondary | ICD-10-CM | POA: Diagnosis not present

## 2017-11-16 DIAGNOSIS — Z0000 Encounter for general adult medical examination without abnormal findings: Secondary | ICD-10-CM

## 2017-11-16 DIAGNOSIS — E1149 Type 2 diabetes mellitus with other diabetic neurological complication: Secondary | ICD-10-CM

## 2017-11-16 DIAGNOSIS — E7800 Pure hypercholesterolemia, unspecified: Secondary | ICD-10-CM | POA: Diagnosis not present

## 2017-11-16 DIAGNOSIS — I10 Essential (primary) hypertension: Secondary | ICD-10-CM

## 2017-11-16 DIAGNOSIS — Z1159 Encounter for screening for other viral diseases: Secondary | ICD-10-CM | POA: Diagnosis not present

## 2017-11-16 MED ORDER — PREGABALIN 25 MG PO CAPS: 25.0000 mg | ORAL_CAPSULE | Freq: Three times a day (TID) | ORAL | 0 refills | Status: DC

## 2017-11-16 NOTE — Progress Notes (Signed)
Patient: Samantha Henry, Female    DOB: 1945/01/29, 72 y.o.   MRN: [id number] Visit Date: 11/16/2017  Today's Provider: Lelon Huh, MD   Chief Complaint  Patient presents with  . Annual Exam  . Diabetes  . Hypertension   Subjective:     Complete Physical Samantha Henry is a 72 y.o. female. She feels well. She reports exercising some. She reports she is sleeping well.  -----------------------------------------------------------   Diabetes Mellitus Type II, Follow-up:   Lab Results  Component Value Date   HGBA1C 6.8 (A) 06/26/2017   HGBA1C 6.3 (H) 12/23/2016   HGBA1C 7.5 (H) 05/20/2016   Last seen for diabetes 5 months ago.  Management since then includes Work on lifestyle changes. She reports excellent compliance with treatment. She is not having side effects.  Current symptoms include none and have been stable. Home blood sugar records: fasting range: low 100's. and   Episodes of hypoglycemia? no   Current Insulin Regimen: None Most Recent Eye Exam: 03/2017 Weight trend: stable  Current diet: in general, a "healthy" diet   Current exercise: some  ------------------------------------------------------------------------   Hypertension, follow-up:  BP Readings from Last 3 Encounters:  11/16/17 132/60  11/16/17 132/60  06/26/17 124/62    She was last seen for hypertension 5 months ago.  BP at that visit was 124/62. Management since that visit includes No changes She reports excellent compliance with treatment. She is not having side effects.  She is exercising. She is adherent to low salt diet.   Outside blood pressures are: Pt reports she is not checking her blood pressure at home. She is experiencing none.  Patient denies chest pain, exertional chest pressure/discomfort, fatigue, irregular heart beat, lower extremity edema and palpitations.   Cardiovascular risk factors include advanced age (older than 38 for men, 79 for women), diabetes  mellitus, dyslipidemia, hypertension and obesity (BMI >= 30 kg/m2).  Use of agents associated with hypertension: none.   ------------------------------------------------------------------------    Lipid/Cholesterol, Follow-up:   Last seen for this 5 months ago.  Management since that visit includes None.  Last Lipid Panel:    Component Value Date/Time   CHOL 154 12/23/2016 1141   CHOL 151 01/21/2016 1226   TRIG 58 12/23/2016 1141   HDL 76 12/23/2016 1141   HDL 63 01/21/2016 1226   CHOLHDL 2.0 12/23/2016 1141   LDLCALC 65 12/23/2016 1141    She reports excellent compliance with treatment. She is not having side effects.   Wt Readings from Last 3 Encounters:  11/16/17 216 lb (98 kg)  11/16/17 216 lb (98 kg)  06/26/17 219 lb (99.3 kg)    ------------------------------------------------------------------------ She brings copy of recent NCS ordered by her neurosurgeon showing. Generalized sensory length dependent polyneuropathy c/w diabetic neuropathy. She is mainly having pain in her lower back which is is steadily worsening. She is doing exercises she was taught at PT, but pain is not improving. She had brief trial of gabapentin in 2014 and states she stopped because it made her nauseated. She has tried Lyrica a few years ago which she reported made her dizzy.    Review of Systems  Constitutional: Negative.   HENT: Positive for congestion and sinus pressure. Negative for dental problem, drooling, ear discharge, ear pain, facial swelling, hearing loss, mouth sores, nosebleeds, postnasal drip, rhinorrhea, sinus pain, sneezing, sore throat, tinnitus, trouble swallowing and voice change.   Eyes: Positive for itching. Negative for photophobia, pain, discharge, redness and visual disturbance.  Respiratory: Positive for wheezing. Negative for apnea, cough, choking, chest tightness, shortness of breath and stridor.   Cardiovascular: Negative.   Gastrointestinal: Negative.     Endocrine: Negative.   Genitourinary: Negative.   Musculoskeletal: Positive for back pain and neck pain. Negative for arthralgias, gait problem, joint swelling, myalgias and neck stiffness.  Skin: Negative.   Allergic/Immunologic: Negative.   Hematological: Negative.   Psychiatric/Behavioral: Negative.     Social History   Socioeconomic History  . Marital status: Single    Spouse name: Not on file  . Number of children: 3  . Years of education: Not on file  . Highest education level: 8th grade  Occupational History  . Occupation: retired  Scientific laboratory technician  . Financial resource strain: Not hard at all  . Food insecurity:    Worry: Never true    Inability: Never true  . Transportation needs:    Medical: No    Non-medical: No  Tobacco Use  . Smoking status: Former Smoker    Packs/day: 1.50    Years: 48.00    Pack years: 72.00    Types: Cigarettes    Last attempt to quit: 01/11/2007    Years since quitting: 10.8  . Smokeless tobacco: Never Used  . Tobacco comment: smoked up to 2 ppd since 72 yo. quit in 2009  Substance and Sexual Activity  . Alcohol use: No    Alcohol/week: 0.0 standard drinks  . Drug use: No  . Sexual activity: Not on file  Lifestyle  . Physical activity:    Days per week: 0 days    Minutes per session: 0 min  . Stress: Not at all  Relationships  . Social connections:    Talks on phone: Patient refused    Gets together: Patient refused    Attends religious service: Patient refused    Active member of club or organization: Patient refused    Attends meetings of clubs or organizations: Patient refused    Relationship status: Patient refused  . Intimate partner violence:    Fear of current or ex partner: Patient refused    Emotionally abused: Patient refused    Physically abused: Patient refused    Forced sexual activity: Patient refused  Other Topics Concern  . Not on file  Social History Narrative  . Not on file    Past Medical History:   Diagnosis Date  . Arthritis    osteo  . Cancer ([place])    CERVICAL CA  . COPD (chronic obstructive pulmonary disease) ([place])   . Diabetes mellitus without complication ([place])   . Diabetic peripheral neuropathy ([place])   . Environmental and seasonal allergies   . GERD (gastroesophageal reflux disease)   . History of cervical cancer   . Hypercholesteremia   . Hypertension   . Hypothyroidism   . Neuropathy   . Sleep apnea   . TIA (transient ischemic attack)   . Von Willebrand's disease [facility])      Patient Active Problem List   Diagnosis Date Noted  . Morbid obesity ([place]) 06/26/2017  . History of smoking 30 or more pack years 05/12/2017  . Status post total knee replacement using cement, left 06/14/2016  . Polyneuropathy 02/26/2015  . Osteoarthritis 08/02/2014  . Allergic rhinitis 07/29/2014  . COPD (chronic obstructive pulmonary disease) ([place]) 07/25/2014  . Diabetes ([place]) 07/25/2014  . Diverticulosis of colon 07/25/2014  . Edema 07/25/2014  . GERD (gastroesophageal reflux disease) 07/25/2014  . Hypercholesteremia 07/25/2014  . Hypertension 07/25/2014  .  Hypothyroid 07/25/2014  . Cramps of lower extremity 07/25/2014  . Microalbuminuria 07/25/2014  . Nail dystrophy 07/25/2014  . Osteoarthritis of leg 07/25/2014  . Restless legs syndrome 07/25/2014  . TIA (transient ischemic attack) 07/25/2014  . Von Willebrand's disease ([place]) 07/25/2014  . OSA (obstructive sleep apnea) 10/31/2013  . History of adenomatous polyp of colon 08/26/2005    Past Surgical History:  Procedure Laterality Date  . ABDOMINAL HYSTERECTOMY  1971   due to cervical cancer   . APPENDECTOMY    . BLADDER SURGERY  1997   bladder tacking  . BREAST BIOPSY Left 01/21/2015   NEG  . CARDIAC CATHETERIZATION    . CARPAL TUNNEL RELEASE  2010  . colonoscopy and upper endoscopy'    . COLONOSCOPY WITH PROPOFOL N/A 02/25/2016   Procedure: COLONOSCOPY WITH PROPOFOL;  Surgeon: Lollie Sails, MD;  [facility]  ENDOSCOPY;  Service: Endoscopy;  Laterality: N/A;  . OOPHORECTOMY    . TENDON RELEASE  2010   right foot, Dr. Vickki Muff   . TOTAL KNEE ARTHROPLASTY Left 06/14/2016   Procedure: TOTAL KNEE ARTHROPLASTY;  Surgeon: Corky Mull, MD;  Location: [facility] ORS;  Service: Orthopedics;  Laterality: Left;    Her family history includes Alzheimer's disease in her mother; Breast cancer in her maternal aunt; Cancer in her father; Cancer (age of onset: 56) in her sister; Diabetes in her brother and sister; Hodgkin's lymphoma in her sister.      Current Outpatient Medications:  .  acetaminophen (TYLENOL) 650 MG CR tablet, Take 650 mg by mouth every 8 (eight) hours as needed for pain., Disp: , Rfl:  .  albuterol (PROVENTIL HFA;VENTOLIN HFA) 108 (90 Base) MCG/ACT inhaler, Inhale 2 puffs into the lungs every 6 (six) hours as needed for wheezing or shortness of breath., Disp: , Rfl:  .  aspirin 81 MG tablet, Take 1 tablet by mouth daily., Disp: , Rfl:  .  budesonide-formoterol (SYMBICORT) 160-4.5 MCG/ACT inhaler, Inhale 2 puffs into the lungs 2 (two) times daily., Disp: , Rfl:  .  calcium carbonate (CALCIUM 600) 600 MG TABS tablet, Take 500 mg by mouth 2 (two) times daily with a meal. , Disp: , Rfl:  .  calcium carbonate (TUMS EX) 750 MG chewable tablet, Chew 2 tablets by mouth 2 (two) times daily as needed for heartburn., Disp: , Rfl:  .  cholecalciferol (VITAMIN D) 400 units TABS tablet, Take 400 Units by mouth daily., Disp: , Rfl:  .  cyclobenzaprine (FLEXERIL) 5 MG tablet, TAKE 1 TABLET BY MOUTH THREE TIMES DAILY AS NEEDED FOR  UP  TO  10  DAYS  FOR  MUSCLE  SPASMS, Disp: 90 tablet, Rfl: 3 .  DULoxetine HCl 40 MG CPEP, Take by mouth daily., Disp: , Rfl:  .  furosemide (LASIX) 20 MG tablet, Take 1 tablet (20 mg total) by mouth as needed., Disp: 30 tablet, Rfl: 5 .  levothyroxine (SYNTHROID, LEVOTHROID) 125 MCG tablet, TAKE 1 TABLET BY MOUTH ONCE DAILY BEFORE BREAKFAST, Disp: 90 tablet, Rfl: 4 .  losartan (COZAAR) 50  MG tablet, TAKE ONE TABLET BY MOUTH ONCE DAILY, Disp: 30 tablet, Rfl: 11 .  metFORMIN (GLUCOPHAGE-XR) 500 MG 24 hr tablet, TAKE TWO TABLETS BY MOUTH IN THE MORNING AND TWO TABLETS IN THE EVENING, Disp: 360 tablet, Rfl: 4 .  mometasone-formoterol (DULERA) 200-5 MCG/ACT AERO, Inhale 2 puffs 2 (two) times daily into the lungs., Disp: , Rfl:  .  MULTIPLE VITAMIN PO, Take 1 tablet by mouth daily., Disp: ,  Rfl:  .  naproxen (NAPROSYN) 375 MG tablet, TAKE 1 TABLET BY MOUTH TWICE DAILY WITH A MEAL FOR 10 DAYS (Patient taking differently: Take 500 mg by mouth 2 (two) times daily with a meal. ), Disp: 60 tablet, Rfl: 3 .  Omega-3 Fatty Acids (FISH OIL BURP-LESS) 1000 MG CAPS, Take 1 tablet by mouth 2 (two) times daily. , Disp: , Rfl:  .  ONE TOUCH ULTRA TEST test strip, USE TO CHECK GLUCOSE ONCE DAILY, Disp: 100 each, Rfl: 4 .  oxyCODONE (OXY IR/ROXICODONE) 5 MG immediate release tablet, Take 1-2 tablets (5-10 mg total) by mouth every 4 (four) hours as needed for breakthrough pain., Disp: 60 tablet, Rfl: 0 .  oxymetazoline (AFRIN) 0.05 % nasal spray, Place 2 sprays into both nostrils at bedtime., Disp: , Rfl:  .  polyethylene glycol (MIRALAX / GLYCOLAX) packet, Take 17 g by mouth daily as needed., Disp: , Rfl:  .  pseudoephedrine-dextromethorphan-guaifenesin (ROBITUSSIN-PE) 30-10-100 MG/5ML solution, Take 10 mLs by mouth 4 (four) times daily as needed for cough., Disp: , Rfl:  .  RABEprazole (ACIPHEX) 20 MG tablet, TAKE 1 TABLET BY MOUTH ONCE DAILY, Disp: 30 tablet, Rfl: 12 .  rosuvastatin (CRESTOR) 20 MG tablet, Take 1 tablet (20 mg total) by mouth daily., Disp: 90 tablet, Rfl: 3 .  tiotropium (SPIRIVA HANDIHALER) 18 MCG inhalation capsule, Place 1 Inhaler into inhaler and inhale daily., Disp: , Rfl:  .  Tiotropium Bromide Monohydrate (SPIRIVA RESPIMAT) 1.25 MCG/ACT AERS, Inhale 1 spray into the lungs every morning., Disp: , Rfl:  .  tiZANidine (ZANAFLEX) 4 MG tablet, Take by mouth., Disp: , Rfl:  .   trolamine salicylate (ASPERCREME) 10 % cream, Apply 1 application topically as needed for muscle pain., Disp: , Rfl:  .  vitamin B-12 (CYANOCOBALAMIN) 50 MCG tablet, Take 50 mcg by mouth daily., Disp: , Rfl:   Patient Care Team: Birdie Sons, MD as PCP - General (Family Medicine) Erby Pian, MD as Consulting Physician (Pulmonary Disease) Lollie Sails, MD as Consulting Physician (Gastroenterology) Anell Barr, OD (Optometry) Kathlene November, MD as Consulting Physician (Neurosurgery)     Objective:   Vitals: BP 132/60   Pulse 85   Temp 98 F (36.7 C) (Oral)   Wt 216 lb (98 kg)   BMI 34.86 kg/m   Physical Exam   General Appearance:    Alert, cooperative, no distress, appears stated age  Head:    Normocephalic, without obvious abnormality, atraumatic  Eyes:    PERRL, conjunctiva/corneas clear, EOM's intact, fundi    benign, both eyes  Ears:    Normal TM's and external ear canals, both ears  Nose:   Nares normal, septum midline, mucosa normal, no drainage    or sinus tenderness  Throat:   Lips, mucosa, and tongue normal; teeth and gums normal  Neck:   Supple, symmetrical, trachea midline, no adenopathy;    thyroid:  no enlargement/tenderness/nodules; no carotid   bruit or JVD  Back:     Symmetric, no curvature, ROM normal, no CVA tenderness  Lungs:     Clear to auscultation bilaterally, respirations unlabored  Chest Wall:    No tenderness or deformity   Heart:    Regular rate and rhythm, S1 and S2 normal, no murmur, rub   or gallop  Breast Exam:    patient declined.   Abdomen:     Soft, non-tender, bowel sounds active all four quadrants,    no masses, no organomegaly  Pelvic:  deferred  Extremities:   Extremities normal, atraumatic, no cyanosis or edema  Pulses:   2+ and symmetric all extremities  Skin:   Skin color, texture, turgor normal, no rashes or lesions  Lymph nodes:   Cervical, supraclavicular, and axillary nodes normal  Neurologic:    CNII-XII intact, normal strength, sensation and reflexes    throughout    Activities of Daily Living In your present state of health, do you have any difficulty performing the following activities: 11/16/2017  Hearing? N  Vision? N  Difficulty concentrating or making decisions? Y  Walking or climbing stairs? N  Dressing or bathing? N  Doing errands, shopping? N  Preparing Food and eating ? N  Using the Toilet? N  In the past six months, have you accidently leaked urine? N  Do you have problems with loss of bowel control? N  Managing your Medications? N  Managing your Finances? N  Housekeeping or managing your Housekeeping? N  Some recent data might be hidden    Fall Risk Assessment Fall Risk  11/16/2017 11/15/2016 12/31/2015 12/26/2014  Falls in the past year? 0 Yes Yes Yes  Number falls in past yr: - 1 2 or more 1  Injury with Fall? - No No No  Follow up - Falls prevention discussed Falls prevention discussed -     Depression Screen PHQ 2/9 Scores 11/16/2017 11/15/2016 12/31/2015 12/26/2014  PHQ - 2 Score 5 0 0 0  PHQ- 9 Score 8 - - -   6CIT Screen 11/16/2017 12/31/2015  What Year? 0 points 0 points  What month? 0 points 0 points  What time? 0 points 0 points  Count back from 20 0 points 0 points  Months in reverse 4 points 2 points  Repeat phrase 4 points 4 points  Total Score 8 6       Assessment & Plan:    Annual Physical Reviewed patient's Family Medical History Reviewed and updated list of patient's medical providers Assessment of cognitive impairment was done Assessed patient's functional ability Established a written schedule for health screening [place] Completed and Reviewed  Exercise Activities and Dietary recommendations Goals    . Increase water intake     Starting 12/31/15, I will continue to drink 6-8 glasses of water a day.    . Increase water intake     Recommend cutting back on the amount of coffee and substituting  with water to drink at least 4-6 glasses a day.   11/16/17- Continue trying to cut back on coffee and increase water intake to 6-8 glasses a day.        Immunization History  Administered Date(s) Administered  . Influenza, High Dose Seasonal PF 10/19/2015, 09/15/2016, 09/18/2017  . Influenza-Unspecified 02/02/2015  . Pneumococcal Conjugate-13 04/12/2013, 09/18/2017  . Pneumococcal Polysaccharide-23 11/24/2011  . Tdap 06/06/2008  . Zoster 09/11/2010    Health Maintenance  Topic Date Due  . Hepatitis C Screening  06/26/1945  . FOOT EXAM  11/20/1956  . HEMOGLOBIN A1C  12/26/2017  . OPHTHALMOLOGY EXAM  03/31/2018  . TETANUS/TDAP  06/07/2018  . MAMMOGRAM  12/31/2018  . DEXA SCAN  02/24/2020  . COLONOSCOPY  02/24/2021  . INFLUENZA VACCINE  Completed  . PNA vac Low Risk Adult  Completed     Discussed health benefits of physical activity, and encouraged her to engage in regular exercise appropriate for her age and condition.    ------------------------------------------------------------------------------------------------------------  1. Annual physical exam   2. Essential hypertension Well  controlled.  Continue current medications.    3. Type 2 diabetes mellitus with neurological complications (HCC)  - Hemoglobin A1c  4. Polyneuropathy Had dizziness with pregabalin in the past. Will try at very low dose and slowly titrate as tolerated.  - pregabalin (LYRICA) 25 MG capsule; Take 1 capsule (25 mg total) by mouth 3 (three) times daily.  Dispense: 60 capsule; Refill: 0  5. Hypothyroidism, unspecified type  - TSH  6. OSA (obstructive sleep apnea) Previously intolerant to cpap and by patient report is no longer needed.   7. Hypercholesteremia She is tolerating rosuvastatin well with no adverse effects.   - Comprehensive metabolic panel - Lipid panel  8. . Need for hepatitis C screening test  - Hepatitis C antibody  9. Anemia due to vitamin B12 deficiency,  unspecified B12 deficiency type  - CBC - Vitamin B12    Lelon Huh, MD  [facility]

## 2017-11-16 NOTE — Progress Notes (Signed)
Subjective:   Samantha Henry is a 72 y.o. female who presents for Medicare Annual (Subsequent) preventive examination.  Review of Systems:  N/A  Cardiac Risk Factors include: advanced age (>44men, >57 women);diabetes mellitus;dyslipidemia;hypertension;obesity (BMI >30kg/m2)     Objective:     Vitals: BP 132/60 (BP Location: Right Arm)   Pulse 85   Temp 98 F (36.7 C) (Oral)   Ht 5\' 6"  (1.676 m)   Wt 216 lb (98 kg)   BMI 34.86 kg/m   Body mass index is 34.86 kg/m.  Advanced Directives 11/16/2017 11/15/2016 06/14/2016 06/01/2016 03/11/2016 02/25/2016 12/31/2015  Does Patient Have a Medical Advance Directive? Yes Yes No No No No No  Type of Paramedic of [place];Living will Living will;Healthcare Power of Attorney - - - - -  Does patient want to make changes to medical advance directive? - - - - - - No - Patient declined  Copy of [place] in Chart? Yes - validated most recent copy scanned in chart (See row information) No - copy requested - - - - -  Would patient like information on creating a medical advance directive? - - Yes (Inpatient - patient requests chaplain consult to create a medical advance directive) - - - -    Tobacco Social History   Tobacco Use  Smoking Status Former Smoker  . Packs/day: 1.50  . Years: 48.00  . Pack years: 72.00  . Types: Cigarettes  . Last attempt to quit: 01/11/2007  . Years since quitting: 10.8  Smokeless Tobacco Never Used  Tobacco Comment   smoked up to 2 ppd since 72 yo. quit in 2009     Counseling given: Not Answered Comment: smoked up to 2 ppd since 72 yo. quit in 2009   Clinical Intake:  Pre-visit preparation completed: Yes  Pain : No/denies pain Pain Score: 0-No pain    Nutrition Risk Assessment:  Has the patient had any N/V/D within the last 2 months?  No  Does the patient have any non-healing wounds?  No  Has the patient had any unintentional weight loss or weight gain?  No    Diabetes:  Is the patient diabetic?  Yes , type 2 If diabetic, was a CBG obtained today?  No  Did the patient bring in their glucometer from home?  No  How often do you monitor your CBG's? Once daily in AM.   Financial Strains and Diabetes Management:  Are you having any financial strains with the device, your supplies or your medication? No .  Does the patient want to be seen by Chronic Care Management for management of their diabetes?  No  Would the patient like to be referred to a Nutritionist or for Diabetic Management?  No   Diabetic Exams:  Diabetic Eye Exam: Completed 03/30/17.   Diabetic Foot Exam: Completed 04/12/13. Pt has been advised about the importance in completing this exam. Note made to have this completed today with PCP.   Nutritional Status: BMI > 30  Obese Nutritional Risks: None Diabetes: Yes  How often do you need to have someone help you when you read instructions, pamphlets, or other written materials from your doctor or pharmacy?: 1 - Never  Interpreter Needed?: No  Information entered by :: [facility], LPN  Past Medical History:  Diagnosis Date  . Arthritis    osteo  . Cancer ([place])    CERVICAL CA  . COPD (chronic obstructive pulmonary disease) ([place])   . Diabetes  mellitus without complication ([place])   . Diabetic peripheral neuropathy ([place])   . Environmental and seasonal allergies   . GERD (gastroesophageal reflux disease)   . History of cervical cancer   . Hypercholesteremia   . Hypertension   . Hypothyroidism   . Neuropathy   . Sleep apnea   . TIA (transient ischemic attack)   . Von Willebrand's disease [facility])    Past Surgical History:  Procedure Laterality Date  . ABDOMINAL HYSTERECTOMY  1971   due to cervical cancer   . APPENDECTOMY    . BLADDER SURGERY  1997   bladder tacking  . BREAST BIOPSY Left 01/21/2015   NEG  . CARDIAC CATHETERIZATION    . CARPAL TUNNEL RELEASE  2010  . colonoscopy and upper endoscopy'    . COLONOSCOPY  WITH PROPOFOL N/A 02/25/2016   Procedure: COLONOSCOPY WITH PROPOFOL;  Surgeon: Lollie Sails, MD;  Location[facility] ENDOSCOPY;  Service: Endoscopy;  Laterality: N/A;  . OOPHORECTOMY    . TENDON RELEASE  2010   right foot, Dr. Vickki Muff   . TOTAL KNEE ARTHROPLASTY Left 06/14/2016   Procedure: TOTAL KNEE ARTHROPLASTY;  Surgeon: Corky Mull, MD;  Location: [facility] ORS;  Service: Orthopedics;  Laterality: Left;   Family History  Problem Relation Age of Onset  . Alzheimer's disease Mother   . Cancer Father   . Diabetes Sister   . Diabetes Brother   . Cancer Sister 36       melanoma  . Breast cancer Maternal Aunt   . Hodgkin's lymphoma Sister    Social History   Socioeconomic History  . Marital status: Single    Spouse name: Not on file  . Number of children: 3  . Years of education: Not on file  . Highest education level: 8th grade  Occupational History  . Occupation: retired  Scientific laboratory technician  . Financial resource strain: Not hard at all  . Food insecurity:    Worry: Never true    Inability: Never true  . Transportation needs:    Medical: No    Non-medical: No  Tobacco Use  . Smoking status: Former Smoker    Packs/day: 1.50    Years: 48.00    Pack years: 72.00    Types: Cigarettes    Last attempt to quit: 01/11/2007    Years since quitting: 10.8  . Smokeless tobacco: Never Used  . Tobacco comment: smoked up to 2 ppd since 72 yo. quit in 2009  Substance and Sexual Activity  . Alcohol use: No    Alcohol/week: 0.0 standard drinks  . Drug use: No  . Sexual activity: Not on file  Lifestyle  . Physical activity:    Days per week: 0 days    Minutes per session: 0 min  . Stress: Not at all  Relationships  . Social connections:    Talks on phone: Patient refused    Gets together: Patient refused    Attends religious service: Patient refused    Active member of club or organization: Patient refused    Attends meetings of clubs or organizations: Patient refused    Relationship  status: Patient refused  Other Topics Concern  . Not on file  Social History Narrative  . Not on file    Outpatient Encounter Medications as of 11/16/2017  Medication Sig  . acetaminophen (TYLENOL) 650 MG CR tablet Take 650 mg by mouth every 8 (eight) hours as needed for pain.  Marland Kitchen albuterol (PROVENTIL HFA;VENTOLIN HFA) 108 (90  Base) MCG/ACT inhaler Inhale 2 puffs into the lungs every 6 (six) hours as needed for wheezing or shortness of breath.  Marland Kitchen aspirin 81 MG tablet Take 1 tablet by mouth daily.  . budesonide-formoterol (SYMBICORT) 160-4.5 MCG/ACT inhaler Inhale 2 puffs into the lungs 2 (two) times daily.  . calcium carbonate (CALCIUM 600) 600 MG TABS tablet Take 500 mg by mouth 2 (two) times daily with a meal.   . calcium carbonate (TUMS EX) 750 MG chewable tablet Chew 2 tablets by mouth 2 (two) times daily as needed for heartburn.  . cholecalciferol (VITAMIN D) 400 units TABS tablet Take 400 Units by mouth daily.  . DULoxetine HCl 40 MG CPEP Take by mouth daily.  . furosemide (LASIX) 20 MG tablet Take 1 tablet (20 mg total) by mouth as needed.  Marland Kitchen levothyroxine (SYNTHROID, LEVOTHROID) 125 MCG tablet TAKE 1 TABLET BY MOUTH ONCE DAILY BEFORE BREAKFAST  . losartan (COZAAR) 50 MG tablet TAKE ONE TABLET BY MOUTH ONCE DAILY  . metFORMIN (GLUCOPHAGE-XR) 500 MG 24 hr tablet TAKE TWO TABLETS BY MOUTH IN THE MORNING AND TWO TABLETS IN THE EVENING  . MULTIPLE VITAMIN PO Take 1 tablet by mouth daily.  . naproxen (NAPROSYN) 375 MG tablet TAKE 1 TABLET BY MOUTH TWICE DAILY WITH A MEAL FOR 10 DAYS (Patient taking differently: Take 500 mg by mouth 2 (two) times daily with a meal. )  . Omega-3 Fatty Acids (FISH OIL BURP-LESS) 1000 MG CAPS Take 1 tablet by mouth 2 (two) times daily.   . ONE TOUCH ULTRA TEST test strip USE TO CHECK GLUCOSE ONCE DAILY  . oxymetazoline (AFRIN) 0.05 % nasal spray Place 2 sprays into both nostrils at bedtime.  . polyethylene glycol (MIRALAX / GLYCOLAX) packet Take 17 g by mouth  daily as needed.  . pseudoephedrine-dextromethorphan-guaifenesin (ROBITUSSIN-PE) 30-10-100 MG/5ML solution Take 10 mLs by mouth 4 (four) times daily as needed for cough.  . RABEprazole (ACIPHEX) 20 MG tablet TAKE 1 TABLET BY MOUTH ONCE DAILY  . rosuvastatin (CRESTOR) 20 MG tablet Take 1 tablet (20 mg total) by mouth daily.  Marland Kitchen tiotropium (SPIRIVA HANDIHALER) 18 MCG inhalation capsule Place 1 Inhaler into inhaler and inhale daily.  . Tiotropium Bromide Monohydrate (SPIRIVA RESPIMAT) 1.25 MCG/ACT AERS Inhale 1 spray into the lungs every morning.  . trolamine salicylate (ASPERCREME) 10 % cream Apply 1 application topically as needed for muscle pain.  . vitamin B-12 (CYANOCOBALAMIN) 50 MCG tablet Take 50 mcg by mouth daily.  . cyclobenzaprine (FLEXERIL) 5 MG tablet TAKE 1 TABLET BY MOUTH THREE TIMES DAILY AS NEEDED FOR  UP  TO  10  DAYS  FOR  MUSCLE  SPASMS (Patient not taking: Reported on 11/16/2017)  . mometasone-formoterol (DULERA) 200-5 MCG/ACT AERO Inhale 2 puffs 2 (two) times daily into the lungs.  Marland Kitchen oxyCODONE (OXY IR/ROXICODONE) 5 MG immediate release tablet Take 1-2 tablets (5-10 mg total) by mouth every 4 (four) hours as needed for breakthrough pain. (Patient not taking: Reported on 11/16/2017)  . tiZANidine (ZANAFLEX) 4 MG tablet Take by mouth.   No facility-administered encounter medications on file as of 11/16/2017.     Activities of Daily Living In your present state of health, do you have any difficulty performing the following activities: 11/16/2017  Hearing? N  Vision? N  Difficulty concentrating or making decisions? Y  Walking or climbing stairs? N  Dressing or bathing? N  Doing errands, shopping? N  Preparing Food and eating ? N  Using the Toilet? N  In  the past six months, have you accidently leaked urine? N  Do you have problems with loss of bowel control? N  Managing your Medications? N  Managing your Finances? N  Housekeeping or managing your Housekeeping? N  Some recent  data might be hidden    Patient Care Team: Birdie Sons, MD as PCP - General (Family Medicine) Erby Pian, MD as Consulting Physician (Pulmonary Disease) Lollie Sails, MD as Consulting Physician (Gastroenterology) Anell Barr, OD (Optometry)    Assessment:   This is a routine wellness examination for Farrah.  Exercise Activities and Dietary recommendations Current Exercise Habits: Home exercise routine, Type of exercise: stretching, Time (Minutes): 20, Frequency (Times/Week): 2, Weekly Exercise (Minutes/Week): 40, Intensity: Mild, Exercise limited by: neurologic condition(s)(has neuropathy)  Goals    . Increase water intake     Starting 12/31/15, I will continue to drink 6-8 glasses of water a day.    . Increase water intake     Recommend cutting back on the amount of coffee and substituting with water to drink at least 4-6 glasses a day.   11/16/17- Continue trying to cut back on coffee and increase water intake to 6-8 glasses a day.        Fall Risk Fall Risk  11/16/2017 11/15/2016 12/31/2015 12/26/2014  Falls in the past year? 0 Yes Yes Yes  Number falls in past yr: - 1 2 or more 1  Injury with Fall? - No No No  Follow up - Falls prevention discussed Falls prevention discussed -   FALL RISK PREVENTION PERTAINING TO THE HOME:  Any stairs in or around the home WITH handrails? No  Home free of loose throw rugs in walkways, pet beds, electrical cords, etc? Yes  Adequate lighting in your home to reduce risk of falls? Yes   ASSISTIVE DEVICES UTILIZED TO PREVENT FALLS:  Life alert? No  Use of a cane, walker or w/c? No  Grab bars in the bathroom? No  Shower chair or bench in shower? No  Elevated toilet seat or a handicapped toilet? No    TIMED UP AND GO:  Was the test performed? No .    Depression Screen PHQ 2/9 Scores 11/16/2017 11/15/2016 12/31/2015 12/26/2014  PHQ - 2 Score 5 0 0 0  PHQ- 9 Score 8 - - -     Cognitive Function     6CIT  Screen 11/16/2017 12/31/2015  What Year? 0 points 0 points  What month? 0 points 0 points  What time? 0 points 0 points  Count back from 20 0 points 0 points  Months in reverse 4 points 2 points  Repeat phrase 4 points 4 points  Total Score 8 6    Immunization History  Administered Date(s) Administered  . Influenza, High Dose Seasonal PF 10/19/2015, 09/15/2016, 09/18/2017  . Influenza-Unspecified 02/02/2015  . Pneumococcal Conjugate-13 04/12/2013, 09/18/2017  . Pneumococcal Polysaccharide-23 11/24/2011  . Tdap 06/06/2008  . Zoster 09/11/2010    Qualifies for Shingles Vaccine? Yes  Zostavax completed 09/11/10. Due for Shingrix. Education has been provided regarding the importance of this vaccine. Pt has been advised to call insurance company to determine out of pocket expense. Advised may also receive vaccine at local pharmacy or Health Dept. Verbalized acceptance and understanding.  Tdap: Up to date  Flu Vaccine: Up to date  Pneumococcal Vaccine: Up to date  Screening Tests Health Maintenance  Topic Date Due  . Hepatitis C Screening  September 02, 1945  . FOOT EXAM  11/20/1956  .  HEMOGLOBIN A1C  12/26/2017  . OPHTHALMOLOGY EXAM  03/31/2018  . TETANUS/TDAP  06/07/2018  . MAMMOGRAM  12/31/2018  . DEXA SCAN  02/24/2020  . COLONOSCOPY  02/24/2021  . INFLUENZA VACCINE  Completed  . PNA vac Low Risk Adult  Completed    Cancer Screenings:  Colorectal Screening: Completed 02/25/16. Repeat every 5 years.  Mammogram: Completed 12/30/16.   Bone Density: Completed 02/23/17.   Lung Cancer Screening: (Low Dose CT Chest recommended if Age 94-80 years, 30 pack-year currently smoking OR have quit w/in 15years.) does qualify, however had this scan completed 06/01/17.  Additional Screening:  Hepatitis C Screening: does qualify; ordered today.   Vision Screening: Recommended annual ophthalmology exams for early detection of glaucoma and other disorders of the eye. Dental Screening:  Recommended annual dental exams for proper oral hygiene  Community Resource Referral:  CRR required this visit?  No       Plan:  I have personally reviewed and addressed the Medicare Annual Wellness questionnaire and have noted the following in the patient's chart:  A. Medical and social history B. Use of alcohol, tobacco or illicit drugs  C. Current medications and supplements D. Functional ability and status E.  Nutritional status F.  Physical activity G. Advance directives H. List of other physicians I.  Hospitalizations, surgeries, and ER visits in previous 12 months J.  [place] such as hearing and vision if needed, cognitive and depression L. Referrals and appointments - none  In addition, I have reviewed and discussed with patient certain preventive protocols, quality metrics, and best practice recommendations. A written personalized care plan for preventive services as well as general preventive health recommendations were provided to patient.  See attached scanned questionnaire for additional information.   Signed,  Fabio Neighbors, LPN Nurse Health Advisor   Nurse Recommendations: Pt needs a diabetic foot exam today. Hep C lab ordered today.

## 2017-11-16 NOTE — Patient Instructions (Addendum)
Samantha Henry , Thank you for taking time to come for your Medicare Wellness Visit. I appreciate your ongoing commitment to your health goals. Please review the following plan we discussed and let me know if I can assist you in the future.   Screening recommendations/referrals: Colonoscopy: Up to date, due 02/2021 Mammogram: Up to date, due 12/2018 Bone Density: Up to date, due 02/2020 Recommended yearly ophthalmology/optometry visit for glaucoma screening and checkup Recommended yearly dental visit for hygiene and checkup  Vaccinations: Influenza vaccine: Up to date Pneumococcal vaccine: Completed series Tdap vaccine: Up to date, due 05/2018 Shingles vaccine: Pt declines today.     Advanced directives: Already on file.   Conditions/risks identified: Obesity- continue trying to cut back on coffee and increase water intake to 6-8 glasses a day.   Next appointment: 10:50 AM today with Dr Caryn Section.    Preventive Care 22 Years and Older, Female Preventive care refers to lifestyle choices and visits with your health care provider that can promote health and wellness. What does preventive care include?  A yearly physical exam. This is also called an annual well check.  Dental exams once or twice a year.  Routine eye exams. Ask your health care provider how often you should have your eyes checked.  Personal lifestyle choices, including:  Daily care of your teeth and gums.  Regular physical activity.  Eating a healthy diet.  Avoiding tobacco and drug use.  Limiting alcohol use.  Practicing safe sex.  Taking low-dose aspirin every day.  Taking vitamin and mineral supplements as recommended by your health care provider. What happens during an annual well check? The services and screenings done by your health care provider during your annual well check will depend on your age, overall health, lifestyle risk factors, and family history of disease. Counseling  Your health care  provider may ask you questions about your:  Alcohol use.  Tobacco use.  Drug use.  Emotional well-being.  Home and relationship well-being.  Sexual activity.  Eating habits.  History of falls.  Memory and ability to understand (cognition).  Work and work Statistician.  Reproductive health. Screening  You may have the following tests or measurements:  Height, weight, and BMI.  Blood pressure.  Lipid and cholesterol levels. These may be checked every 5 years, or more frequently if you are over 61 years old.  Skin check.  Lung cancer screening. You may have this screening every year starting at age 64 if you have a 30-pack-year history of smoking and currently smoke or have quit within the past 15 years.  Fecal occult blood test (FOBT) of the stool. You may have this test every year starting at age 70.  Flexible sigmoidoscopy or colonoscopy. You may have a sigmoidoscopy every 5 years or a colonoscopy every 10 years starting at age 8.  Hepatitis C blood test.  Hepatitis B blood test.  Sexually transmitted disease (STD) testing.  Diabetes screening. This is done by checking your blood sugar (glucose) after you have not eaten for a while (fasting). You may have this done every 1-3 years.  Bone density scan. This is done to screen for osteoporosis. You may have this done starting at age 22.  Mammogram. This may be done every 1-2 years. Talk to your health care provider about how often you should have regular mammograms. Talk with your health care provider about your test results, treatment options, and if necessary, the need for more tests. Vaccines  Your health care provider may  recommend certain vaccines, such as:  Influenza vaccine. This is recommended every year.  Tetanus, diphtheria, and acellular pertussis (Tdap, Td) vaccine. You may need a Td booster every 10 years.  Zoster vaccine. You may need this after age 38.  Pneumococcal 13-valent conjugate (PCV13)  vaccine. One dose is recommended after age 8.  Pneumococcal polysaccharide (PPSV23) vaccine. One dose is recommended after age 27. Talk to your health care provider about which screenings and vaccines you need and how often you need them. This information is not intended to replace advice given to you by your health care provider. Make sure you discuss any questions you have with your health care provider. Document Released: 01/23/2015 Document Revised: 09/16/2015 Document Reviewed: 10/28/2014 Elsevier Interactive Patient Education  2017 [place] Prevention in the Home Falls can cause injuries. They can happen to people of all ages. There are many things you can do to make your home safe and to help prevent falls. What can I do on the outside of my home?  Regularly fix the edges of walkways and driveways and fix any cracks.  Remove anything that might make you trip as you walk through a door, such as a raised step or threshold.  Trim any bushes or trees on the path to your home.  Use bright outdoor lighting.  Clear any walking paths of anything that might make someone trip, such as rocks or tools.  Regularly check to see if handrails are loose or broken. Make sure that both sides of any steps have handrails.  Any raised decks and porches should have guardrails on the edges.  Have any leaves, snow, or ice cleared regularly.  Use sand or salt on walking paths during winter.  Clean up any spills in your garage right away. This includes oil or grease spills. What can I do in the bathroom?  Use night lights.  Install grab bars by the toilet and in the tub and shower. Do not use towel bars as grab bars.  Use non-skid mats or decals in the tub or shower.  If you need to sit down in the shower, use a plastic, non-slip stool.  Keep the floor dry. Clean up any water that spills on the floor as soon as it happens.  Remove soap buildup in the tub or shower  regularly.  Attach bath mats securely with double-sided non-slip rug tape.  Do not have throw rugs and other things on the floor that can make you trip. What can I do in the bedroom?  Use night lights.  Make sure that you have a light by your bed that is easy to reach.  Do not use any sheets or blankets that are too big for your bed. They should not hang down onto the floor.  Have a firm chair that has side arms. You can use this for support while you get dressed.  Do not have throw rugs and other things on the floor that can make you trip. What can I do in the kitchen?  Clean up any spills right away.  Avoid walking on wet floors.  Keep items that you use a lot in easy-to-reach places.  If you need to reach something above you, use a strong step stool that has a grab bar.  Keep electrical cords out of the way.  Do not use floor polish or wax that makes floors slippery. If you must use wax, use non-skid floor wax.  Do not have throw rugs and  other things on the floor that can make you trip. What can I do with my stairs?  Do not leave any items on the stairs.  Make sure that there are handrails on both sides of the stairs and use them. Fix handrails that are broken or loose. Make sure that handrails are as long as the stairways.  Check any carpeting to make sure that it is firmly attached to the stairs. Fix any carpet that is loose or worn.  Avoid having throw rugs at the top or bottom of the stairs. If you do have throw rugs, attach them to the floor with carpet tape.  Make sure that you have a light switch at the top of the stairs and the bottom of the stairs. If you do not have them, ask someone to add them for you. What else can I do to help prevent falls?  Wear shoes that:  Do not have high heels.  Have rubber bottoms.  Are comfortable and fit you well.  Are closed at the toe. Do not wear sandals.  If you use a stepladder:  Make sure that it is fully  opened. Do not climb a closed stepladder.  Make sure that both sides of the stepladder are locked into place.  Ask someone to hold it for you, if possible.  Clearly mark and make sure that you can see:  Any grab bars or handrails.  First and last steps.  Where the edge of each step is.  Use tools that help you move around (mobility aids) if they are needed. These include:  Canes.  Walkers.  Scooters.  Crutches.  Turn on the lights when you go into a dark area. Replace any light bulbs as soon as they burn out.  Set up your furniture so you have a clear path. Avoid moving your furniture around.  If any of your floors are uneven, fix them.  If there are any pets around you, be aware of where they are.  Review your medicines with your doctor. Some medicines can make you feel dizzy. This can increase your chance of falling. Ask your doctor what other things that you can do to help prevent falls. This information is not intended to replace advice given to you by your health care provider. Make sure you discuss any questions you have with your health care provider. Document Released: 10/23/2008 Document Revised: 06/04/2015 Document Reviewed: 01/31/2014 Elsevier Interactive Patient Education  2017 [employer].

## 2017-11-16 NOTE — Patient Instructions (Addendum)
   The CDC recommends two doses of Shingrix (the shingles vaccine) separated by 2 to 6 months for adults age 71 years and older. I recommend checking with your insurance plan regarding coverage for this vaccine.    Start low dose of pregabalin 25mg  three times a day for the next three weeks   If you are tolerating medication, we will call and increase dose to 50mg  three times a day  before it runs out in three weeks.

## 2017-11-17 ENCOUNTER — Telehealth: Payer: Self-pay

## 2017-11-17 LAB — LIPID PANEL
CHOLESTEROL TOTAL: 143 mg/dL (ref 100–199)
Chol/HDL Ratio: 1.9 ratio (ref 0.0–4.4)
HDL: 74 mg/dL (ref >39)
LDL CALC: 58 mg/dL (ref 0–99)
TRIGLYCERIDES: 56 mg/dL (ref 0–149)
VLDL Cholesterol Cal: 11 mg/dL (ref 5–40)

## 2017-11-17 LAB — COMPREHENSIVE METABOLIC PANEL
A/G RATIO: 1.9 (ref 1.2–2.2)
ALK PHOS: 53 IU/L (ref 39–117)
ALT: 16 IU/L (ref 0–32)
AST: 28 IU/L (ref 0–40)
Albumin: 4.4 g/dL (ref 3.5–4.8)
BILIRUBIN TOTAL: 0.2 mg/dL (ref 0.0–1.2)
BUN/Creatinine Ratio: 12 (ref 12–28)
BUN: 8 mg/dL (ref 8–27)
CHLORIDE: 97 mmol/L (ref 96–106)
CO2: 23 mmol/L (ref 20–29)
Calcium: 9.5 mg/dL (ref 8.7–10.3)
Creatinine, Ser: 0.68 mg/dL (ref 0.57–1.00)
GFR calc Af Amer: 102 mL/min/{1.73_m2} (ref >59)
GFR calc non Af Amer: 89 mL/min/{1.73_m2} (ref >59)
Globulin, Total: 2.3 g/dL (ref 1.5–4.5)
Glucose: 125 mg/dL — ABNORMAL HIGH (ref 65–99)
POTASSIUM: 3.9 mmol/L (ref 3.5–5.2)
Sodium: 135 mmol/L (ref 134–144)
Total Protein: 6.7 g/dL (ref 6.0–8.5)

## 2017-11-17 LAB — VITAMIN B12: Vitamin B-12: 742 pg/mL (ref 232–1245)

## 2017-11-17 LAB — CBC
HEMOGLOBIN: 10 g/dL — AB (ref 11.1–15.9)
Hematocrit: 32.4 % — ABNORMAL LOW (ref 34.0–46.6)
MCH: 24.3 pg — ABNORMAL LOW (ref 26.6–33.0)
MCHC: 30.9 g/dL — AB (ref 31.5–35.7)
MCV: 79 fL (ref 79–97)
Platelets: 211 10*3/uL (ref 150–450)
RBC: 4.12 x10E6/uL (ref 3.77–5.28)
RDW: 15.3 % (ref 12.3–15.4)
WBC: 6.4 10*3/uL (ref 3.4–10.8)

## 2017-11-17 LAB — TSH: TSH: 1.07 u[IU]/mL (ref 0.450–4.500)

## 2017-11-17 LAB — HEMOGLOBIN A1C
Est. average glucose Bld gHb Est-mCnc: 134 mg/dL
Hgb A1c MFr Bld: 6.3 % — ABNORMAL HIGH (ref 4.8–5.6)

## 2017-11-17 LAB — HEPATITIS C ANTIBODY: Hep C Virus Ab: <0.1 s/co ratio (ref 0.0–0.9)

## 2017-11-17 NOTE — Telephone Encounter (Signed)
-----   Message from Birdie Sons, MD sent at 11/17/2017  7:51 AM EST ----- Labs are all good. a1c is 6.3%. Cholesterol is very good at 143. Can try taking OTC vitamin B12 1000 units a day for neuropathy. Follow up 6 months.

## 2017-11-17 NOTE — Telephone Encounter (Signed)
Pt advised.   Thanks,   -Porchia Sinkler  

## 2017-11-27 ENCOUNTER — Other Ambulatory Visit: Payer: Self-pay | Admitting: Family Medicine

## 2017-11-27 NOTE — Telephone Encounter (Signed)
Pt needing refills on:  OneTouch Ultra test strips   Please fill at:  [facility] (N), [place] - [address] [phone] (Phone) [phone] (Fax)    Thanks, [employer]

## 2017-11-28 ENCOUNTER — Ambulatory Visit: Payer: Self-pay | Admitting: Family Medicine

## 2017-11-28 MED ORDER — GLUCOSE BLOOD VI STRP: ORAL_STRIP | 4 refills | Status: DC

## 2017-12-03 ENCOUNTER — Telehealth: Payer: Self-pay | Admitting: Family Medicine

## 2017-12-03 NOTE — Telephone Encounter (Signed)
Please check with patient and see if she is having trouble tolerating the 25mg  Lyrica. If tolerating, then let me know and i'll send in prescription to increase to 50mg  capsules

## 2017-12-04 NOTE — Telephone Encounter (Signed)
Pt not able to tolerate medication. Patient reports medication makes her drowsy the all day. Patient stopped taking medication last week. Patient reports she is still feeling dizzy, light headed, patient denies any visual changes or headaches. Patient reports she will give it a few more days if not feeling better will call to schedule appt. Patient aware that Dr. Caryn Section is out of the office.

## 2017-12-08 ENCOUNTER — Other Ambulatory Visit: Payer: Self-pay | Admitting: Family Medicine

## 2017-12-18 ENCOUNTER — Other Ambulatory Visit: Payer: Self-pay | Admitting: Family Medicine

## 2018-01-09 ENCOUNTER — Ambulatory Visit
Admission: RE | Admit: 2018-01-09 | Discharge: 2018-01-09 | Disposition: A | Discharge status: Home / Self Care | Payer: PPO | Source: Ambulatory Visit | Attending: Family Medicine | Admitting: Family Medicine

## 2018-01-09 DIAGNOSIS — Z1231 Encounter for screening mammogram for malignant neoplasm of breast: Secondary | ICD-10-CM | POA: Insufficient documentation

## 2018-01-11 ENCOUNTER — Other Ambulatory Visit: Payer: Self-pay | Admitting: Family Medicine

## 2018-01-11 ENCOUNTER — Encounter: Payer: Self-pay | Admitting: Family Medicine

## 2018-01-11 ENCOUNTER — Ambulatory Visit (INDEPENDENT_AMBULATORY_CARE_PROVIDER_SITE_OTHER): Payer: PPO | Admitting: Family Medicine

## 2018-01-11 VITALS — BP 125/70 | HR 71 | Temp 97.8°F | Resp 16 | Wt 210.0 lb

## 2018-01-11 DIAGNOSIS — M545 Low back pain, unspecified: Secondary | ICD-10-CM

## 2018-01-11 LAB — POCT URINALYSIS DIPSTICK
BILIRUBIN UA: NEGATIVE
Blood, UA: NEGATIVE
Glucose, UA: NEGATIVE
Ketones, UA: NEGATIVE
Leukocytes, UA: NEGATIVE
NITRITE UA: NEGATIVE
PROTEIN UA: NEGATIVE
Spec Grav, UA: 1.01 (ref 1.010–1.025)
UROBILINOGEN UA: 0.2 U/dL
pH, UA: 7.5 (ref 5.0–8.0)

## 2018-01-11 MED ORDER — DICLOFENAC EPOLAMINE 1.3 % TD PTCH: 1.0000 | MEDICATED_PATCH | Freq: Two times a day (BID) | TRANSDERMAL | 5 refills | Status: DC

## 2018-01-11 MED ORDER — ONETOUCH ULTRA 2 W/DEVICE KIT: PACK | 0 refills | Status: DC

## 2018-01-11 NOTE — Progress Notes (Signed)
Patient: Samantha Henry Female    DOB: Aug 14, 1945   73 y.o.   MRN: [id number] Visit Date: 01/11/2018  Today's Provider: Lelon Huh, MD   Chief Complaint  Patient presents with  . Back Pain    x several months   Subjective:     Back Pain  This is a recurrent problem. Episode onset: several months ago. The problem occurs intermittently. The problem has been gradually worsening since onset. The pain is present in the lumbar spine. The quality of the pain is described as aching. The pain does not radiate. The symptoms are aggravated by bending, sitting and lying down. Pertinent negatives include no abdominal pain, chest pain, fever or weakness.  No recent injury, but pain has worsened lately and settled into mid lower back. Has tried OTC lidocaine patches in past.   Allergies  Allergen Reactions  . Aspirin     AVOIDs due to history of Von Willebrands  . Lyrica [Pregabalin] Other (See Comments)    Dizziness  . Tetracycline      Current Outpatient Medications:  .  acetaminophen (TYLENOL) 650 MG CR tablet, Take 650 mg by mouth every 8 (eight) hours as needed for pain., Disp: , Rfl:  .  albuterol (PROVENTIL HFA;VENTOLIN HFA) 108 (90 Base) MCG/ACT inhaler, Inhale 2 puffs into the lungs every 6 (six) hours as needed for wheezing or shortness of breath., Disp: , Rfl:  .  aspirin 81 MG tablet, Take 1 tablet by mouth daily., Disp: , Rfl:  .  budesonide-formoterol (SYMBICORT) 160-4.5 MCG/ACT inhaler, Inhale 2 puffs into the lungs 2 (two) times daily., Disp: , Rfl:  .  calcium carbonate (CALCIUM 600) 600 MG TABS tablet, Take 500 mg by mouth 2 (two) times daily with a meal. , Disp: , Rfl:  .  calcium carbonate (TUMS EX) 750 MG chewable tablet, Chew 2 tablets by mouth 2 (two) times daily as needed for heartburn., Disp: , Rfl:  .  cholecalciferol (VITAMIN D) 400 units TABS tablet, Take 400 Units by mouth daily., Disp: , Rfl:  .  cyclobenzaprine (FLEXERIL) 5 MG tablet, TAKE 1 TABLET BY  MOUTH THREE TIMES DAILY AS NEEDED FOR  UP  TO  10  DAYS  FOR  MUSCLE  SPASMS, Disp: 90 tablet, Rfl: 3 .  DULoxetine HCl 40 MG CPEP, Take by mouth daily., Disp: , Rfl:  .  levothyroxine (SYNTHROID, LEVOTHROID) 125 MCG tablet, TAKE 1 TABLET BY MOUTH ONCE DAILY BEFORE BREAKFAST, Disp: 90 tablet, Rfl: 4 .  losartan (COZAAR) 50 MG tablet, TAKE 1 TABLET BY MOUTH ONCE DAILY, Disp: 90 tablet, Rfl: 1 .  metFORMIN (GLUCOPHAGE-XR) 500 MG 24 hr tablet, TAKE TWO TABLETS BY MOUTH IN THE MORNING AND TWO TABLETS IN THE EVENING, Disp: 360 tablet, Rfl: 4 .  MULTIPLE VITAMIN PO, Take 1 tablet by mouth daily., Disp: , Rfl:  .  naproxen (NAPROSYN) 375 MG tablet, TAKE 1 TABLET BY MOUTH TWICE DAILY WITH A MEAL FOR 10 DAYS (Patient taking differently: Take 500 mg by mouth 2 (two) times daily with a meal. ), Disp: 60 tablet, Rfl: 3 .  Omega-3 Fatty Acids (FISH OIL BURP-LESS) 1000 MG CAPS, Take 1 tablet by mouth 2 (two) times daily. , Disp: , Rfl:  .  oxymetazoline (AFRIN) 0.05 % nasal spray, Place 2 sprays into both nostrils at bedtime., Disp: , Rfl:  .  RABEprazole (ACIPHEX) 20 MG tablet, TAKE 1 TABLET BY MOUTH ONCE DAILY, Disp: 30 tablet, Rfl: 12 .  rosuvastatin (CRESTOR) 20 MG tablet, TAKE 1 TABLET BY MOUTH ONCE DAILY, Disp: 90 tablet, Rfl: 3 .  Tiotropium Bromide Monohydrate (SPIRIVA RESPIMAT) 1.25 MCG/ACT AERS, Inhale 1 spray into the lungs every morning., Disp: , Rfl:  .  trolamine salicylate (ASPERCREME) 10 % cream, Apply 1 application topically as needed for muscle pain., Disp: , Rfl:  .  vitamin B-12 (CYANOCOBALAMIN) 50 MCG tablet, Take 50 mcg by mouth daily., Disp: , Rfl:  .  furosemide (LASIX) 20 MG tablet, Take 1 tablet (20 mg total) by mouth as needed. (Patient not taking: Reported on 01/11/2018), Disp: 30 tablet, Rfl: 5 .  glucose blood (ONE TOUCH ULTRA TEST) test strip, Use as instructed to check blood sugar daily for type 2 diabetes E11.9, Disp: 100 each, Rfl: 4 .  mometasone-formoterol (DULERA) 200-5 MCG/ACT  AERO, Inhale 2 puffs 2 (two) times daily into the lungs., Disp: , Rfl:  .  oxyCODONE (OXY IR/ROXICODONE) 5 MG immediate release tablet, Take 1-2 tablets (5-10 mg total) by mouth every 4 (four) hours as needed for breakthrough pain. (Patient not taking: Reported on 01/11/2018), Disp: 60 tablet, Rfl: 0 .  polyethylene glycol (MIRALAX / GLYCOLAX) packet, Take 17 g by mouth daily as needed., Disp: , Rfl:  .  pregabalin (LYRICA) 25 MG capsule, Take 1 capsule (25 mg total) by mouth 3 (three) times daily. (Patient not taking: Reported on 01/11/2018), Disp: 60 capsule, Rfl: 0 .  pseudoephedrine-dextromethorphan-guaifenesin (ROBITUSSIN-PE) 30-10-100 MG/5ML solution, Take 10 mLs by mouth 4 (four) times daily as needed for cough., Disp: , Rfl:  .  tiotropium (SPIRIVA HANDIHALER) 18 MCG inhalation capsule, Place 1 Inhaler into inhaler and inhale daily., Disp: , Rfl:  .  tiZANidine (ZANAFLEX) 4 MG tablet, Take by mouth., Disp: , Rfl:   Review of Systems  Constitutional: Negative for appetite change, chills, fatigue and fever.  Respiratory: Negative for chest tightness and shortness of breath.   Cardiovascular: Negative for chest pain and palpitations.  Gastrointestinal: Negative for abdominal pain, nausea and vomiting.  Genitourinary: Negative for difficulty urinating.       Urine is darker than usual  Musculoskeletal: Positive for back pain.  Skin: Positive for color change (skin spots on arms).  Neurological: Negative for dizziness and weakness.    Social History   Tobacco Use  . Smoking status: Former Smoker    Packs/day: 1.50    Years: 48.00    Pack years: 72.00    Types: Cigarettes    Last attempt to quit: 01/11/2007    Years since quitting: 11.0  . Smokeless tobacco: Never Used  . Tobacco comment: smoked up to 2 ppd since 73 yo. quit in 2009  Substance Use Topics  . Alcohol use: No    Alcohol/week: 0.0 standard drinks      Objective:   BP 125/70 (BP Location: Right Arm, Patient Position:  Sitting, Cuff Size: Large)   Pulse 71   Temp 97.8 F (36.6 C) (Oral)   Resp 16   Wt 210 lb (95.3 kg)   SpO2 95% Comment: room air  BMI 33.89 kg/m  Vitals:   01/11/18 1148  BP: 125/70  Pulse: 71  Resp: 16  Temp: 97.8 F (36.6 C)  TempSrc: Oral  SpO2: 95%  Weight: 210 lb (95.3 kg)     Physical Exam   General Appearance:    Alert, cooperative, no distress  Eyes:    PERRL, conjunctiva/corneas clear, EOM's intact       Lungs:     Clear  to auscultation bilaterally, respirations unlabored  Heart:    Regular rate and rhythm  MS:   Tender mid para lumbar muscles, no gross deformities. +5 muscle strength bilaterally.       Results for orders placed or performed in visit on 01/11/18  POCT Urinalysis Dipstick  Result Value Ref Range   Color, UA yellow    Clarity, UA clear    Glucose, UA Negative Negative   Bilirubin, UA negative    Ketones, UA negative    Spec Grav, UA 1.010 1.010 - 1.025   Blood, UA negative    pH, UA 7.5 5.0 - 8.0   Protein, UA Negative Negative   Urobilinogen, UA 0.2 0.2 or 1.0 E.U./dL   Nitrite, UA negative    Leukocytes, UA Negative Negative   Appearance     Odor          Assessment & Plan    1. Low back pain without sciatica, unspecified back pain laterality, unspecified chronicity  - POCT Urinalysis Dipstick - diclofenac (FLECTOR) 1.3 % PTCH; Place 1 patch onto the skin 2 (two) times daily.  Dispense: 60 patch; Refill: 5  Call if symptoms change or if not rapidly improving.        Lelon Huh, MD  [facility]

## 2018-01-11 NOTE — Patient Instructions (Addendum)
.   Please bring all of your medications to every appointment so we can make sure that our medication list is the same as yours.    You can try OTC Lidocaine Patches for you back which you can get without a prescription

## 2018-02-01 ENCOUNTER — Ambulatory Visit (INDEPENDENT_AMBULATORY_CARE_PROVIDER_SITE_OTHER): Payer: PPO | Admitting: Family Medicine

## 2018-02-01 ENCOUNTER — Encounter: Payer: Self-pay | Admitting: Family Medicine

## 2018-02-01 DIAGNOSIS — M549 Dorsalgia, unspecified: Secondary | ICD-10-CM | POA: Diagnosis not present

## 2018-02-01 DIAGNOSIS — M5030 Other cervical disc degeneration, unspecified cervical region: Secondary | ICD-10-CM | POA: Diagnosis not present

## 2018-02-01 DIAGNOSIS — G8929 Other chronic pain: Secondary | ICD-10-CM | POA: Insufficient documentation

## 2018-02-01 MED ORDER — METHOCARBAMOL 500 MG PO TABS: 1000.0000 mg | ORAL_TABLET | Freq: Three times a day (TID) | ORAL | 3 refills | Status: DC | PRN

## 2018-02-01 NOTE — Patient Instructions (Signed)
.   Try an OTC TENs unit (nerve stimulator) to help with back pain. You can get this at any pharmacy. Ask your pharmacist for help picking one out.   Call back for referral to [facility] if the muscle relaxers and TENs unit are not helping with pain.

## 2018-02-01 NOTE — Progress Notes (Signed)
Patient: Samantha Henry Female    DOB: Mar 16, 1945   73 y.o.   MRN: [id number] Visit Date: 02/01/2018  Today's Provider: Lelon Huh, MD   Chief Complaint  Patient presents with  . Back Pain   Subjective:     Back Pain  This is a recurrent problem. Episode onset: Patient was last seen for lower back pain 3 weeks ago and was advised to try OTC lidocaine patches. The problem occurs constantly. The problem has been rapidly worsening since onset. The pain is present in the lumbar spine. Pertinent negatives include no abdominal pain, chest pain, fever or weakness. Treatments tried: Asper Cream patches. The treatment provided no relief.  Was also prescribed diclofenac patches which she states have not helped. She had MRI Lumbar spine 07/29/2017 showing multilevel disc bulging. She had EMG c/w only diabetic neuropathy. She had L5-S1 epidural injections several months ago. She last saw NS Dr. Tommi Emery in October at that time was felt not to warrant surgical intervention and advised to follow up as needed.   She states pain has pain has gotten more intense in thoracic spine. No currently radiating to legs.   Wt Readings from Last 3 Encounters:  02/01/18 204 lb (92.5 kg)  01/11/18 210 lb (95.3 kg)  11/16/17 216 lb (98 kg)     Allergies  Allergen Reactions  . Aspirin     AVOIDs due to history of Von Willebrands  . Lyrica [Pregabalin] Other (See Comments)    Dizziness  . Tetracycline      Current Outpatient Medications:  .  acetaminophen (TYLENOL) 650 MG CR tablet, Take 650 mg by mouth every 8 (eight) hours as needed for pain., Disp: , Rfl:  .  albuterol (PROVENTIL HFA;VENTOLIN HFA) 108 (90 Base) MCG/ACT inhaler, Inhale 2 puffs into the lungs every 6 (six) hours as needed for wheezing or shortness of breath., Disp: , Rfl:  .  aspirin 81 MG tablet, Take 1 tablet by mouth daily., Disp: , Rfl:  .  Blood Glucose Monitoring Suppl (ONE TOUCH ULTRA 2) w/Device KIT, Use daily to check  blood sugar for type 2 diabetes E11.9, Disp: 1 each, Rfl: 0 .  budesonide-formoterol (SYMBICORT) 160-4.5 MCG/ACT inhaler, Inhale 2 puffs into the lungs 2 (two) times daily., Disp: , Rfl:  .  calcium carbonate (CALCIUM 600) 600 MG TABS tablet, Take 500 mg by mouth 2 (two) times daily with a meal. , Disp: , Rfl:  .  calcium carbonate (TUMS EX) 750 MG chewable tablet, Chew 2 tablets by mouth 2 (two) times daily as needed for heartburn., Disp: , Rfl:  .  cholecalciferol (VITAMIN D) 400 units TABS tablet, Take 400 Units by mouth daily., Disp: , Rfl:  .  cyclobenzaprine (FLEXERIL) 5 MG tablet, TAKE 1 TABLET BY MOUTH THREE TIMES DAILY AS NEEDED FOR  UP  TO  10  DAYS  FOR  MUSCLE  SPASMS, Disp: 90 tablet, Rfl: 3 .  diclofenac (FLECTOR) 1.3 % PTCH, Place 1 patch onto the skin 2 (two) times daily., Disp: 60 patch, Rfl: 5 .  DULoxetine HCl 40 MG CPEP, Take by mouth daily., Disp: , Rfl:  .  glucose blood (ONE TOUCH ULTRA TEST) test strip, Use as instructed to check blood sugar daily for type 2 diabetes E11.9, Disp: 100 each, Rfl: 4 .  levothyroxine (SYNTHROID, LEVOTHROID) 125 MCG tablet, TAKE 1 TABLET BY MOUTH ONCE DAILY BEFORE BREAKFAST, Disp: 90 tablet, Rfl: 4 .  losartan (COZAAR) 50 MG  tablet, TAKE 1 TABLET BY MOUTH ONCE DAILY, Disp: 90 tablet, Rfl: 1 .  metFORMIN (GLUCOPHAGE-XR) 500 MG 24 hr tablet, TAKE 2 TABLETS BY MOUTH IN THE MORNING, THEN 2 TABLETS BY MOUTH IN THE EVENING, Disp: 360 tablet, Rfl: 4 .  mometasone-formoterol (DULERA) 200-5 MCG/ACT AERO, Inhale 2 puffs 2 (two) times daily into the lungs., Disp: , Rfl:  .  MULTIPLE VITAMIN PO, Take 1 tablet by mouth daily., Disp: , Rfl:  .  naproxen (NAPROSYN) 375 MG tablet, TAKE 1 TABLET BY MOUTH TWICE DAILY WITH A MEAL FOR 10 DAYS (Patient taking differently: Take 500 mg by mouth 2 (two) times daily with a meal. ), Disp: 60 tablet, Rfl: 3 .  Omega-3 Fatty Acids (FISH OIL BURP-LESS) 1000 MG CAPS, Take 1 tablet by mouth 2 (two) times daily. , Disp: , Rfl:  .   oxymetazoline (AFRIN) 0.05 % nasal spray, Place 2 sprays into both nostrils at bedtime., Disp: , Rfl:  .  polyethylene glycol (MIRALAX / GLYCOLAX) packet, Take 17 g by mouth daily as needed., Disp: , Rfl:  .  pseudoephedrine-dextromethorphan-guaifenesin (ROBITUSSIN-PE) 30-10-100 MG/5ML solution, Take 10 mLs by mouth 4 (four) times daily as needed for cough., Disp: , Rfl:  .  RABEprazole (ACIPHEX) 20 MG tablet, TAKE 1 TABLET BY MOUTH ONCE DAILY, Disp: 30 tablet, Rfl: 12 .  rosuvastatin (CRESTOR) 20 MG tablet, TAKE 1 TABLET BY MOUTH ONCE DAILY, Disp: 90 tablet, Rfl: 3 .  Tiotropium Bromide Monohydrate (SPIRIVA RESPIMAT) 1.25 MCG/ACT AERS, Inhale 1 spray into the lungs every morning., Disp: , Rfl:  .  tiZANidine (ZANAFLEX) 4 MG tablet, Take by mouth., Disp: , Rfl:  .  trolamine salicylate (ASPERCREME) 10 % cream, Apply 1 application topically as needed for muscle pain., Disp: , Rfl:  .  vitamin B-12 (CYANOCOBALAMIN) 50 MCG tablet, Take 50 mcg by mouth daily., Disp: , Rfl:   Review of Systems  Constitutional: Negative for appetite change, chills, fatigue and fever.  Respiratory: Negative for chest tightness and shortness of breath.   Cardiovascular: Negative for chest pain and palpitations.  Gastrointestinal: Positive for constipation. Negative for abdominal pain, nausea and vomiting.  Musculoskeletal: Positive for back pain.  Neurological: Negative for dizziness and weakness.  Psychiatric/Behavioral:       Memory problems    Social History   Tobacco Use  . Smoking status: Former Smoker    Packs/day: 1.50    Years: 48.00    Pack years: 72.00    Types: Cigarettes    Last attempt to quit: 01/11/2007    Years since quitting: 11.0  . Smokeless tobacco: Never Used  . Tobacco comment: smoked up to 2 ppd since 73 yo. quit in 2009  Substance Use Topics  . Alcohol use: No    Alcohol/week: 0.0 standard drinks      Objective:   BP 117/70 (BP Location: Left Arm, Patient Position: Sitting, Cuff  Size: Large)   Pulse 81   Temp 97.9 F (36.6 C) (Oral)   Resp 16   Wt 204 lb (92.5 kg)   SpO2 95% Comment: room air  BMI 32.93 kg/m  Vitals:   02/01/18 1048  BP: 117/70  Pulse: 81  Resp: 16  Temp: 97.9 F (36.6 C)  TempSrc: Oral  SpO2: 95%  Weight: 204 lb (92.5 kg)     Physical Exam  General Appearance:    Alert, cooperative, no distress  Eyes:    PERRL, conjunctiva/corneas clear, EOM's intact       Lungs:  Clear to auscultation bilaterally, respirations unlabored  Heart:    Regular rate and rhythm  MS:   Tender mid para lumbar and thoracic muscles, no gross deformities. +5 muscle strength bilaterally.          Assessment & Plan    1. DDD (degenerative disc disease), cervical  - methocarbamol (ROBAXIN) 500 MG tablet; Take 2 tablets (1,000 mg total) by mouth every 8 (eight) hours as needed for muscle spasms.  Dispense: 60 tablet; Refill: 3  2. Chronic bilateral back pain, unspecified back location  - methocarbamol (ROBAXIN) 500 MG tablet; Take 2 tablets (1,000 mg total) by mouth every 8 (eight) hours as needed for muscle spasms.  Dispense: 60 tablet; Refill: 3  Strongly encouraged physical therapy for her back which she is very resistant due as she had PT for legs in the past which she didn't find helpful. Advised back PT would likely be beneficial, but declined for now.     Lelon Huh, MD  [facility]

## 2018-02-19 ENCOUNTER — Ambulatory Visit (INDEPENDENT_AMBULATORY_CARE_PROVIDER_SITE_OTHER): Payer: PPO | Admitting: Family Medicine

## 2018-02-19 ENCOUNTER — Encounter: Payer: Self-pay | Admitting: Family Medicine

## 2018-02-19 VITALS — BP 134/62 | HR 88 | Temp 98.4°F | Wt 207.0 lb

## 2018-02-19 DIAGNOSIS — J40 Bronchitis, not specified as acute or chronic: Secondary | ICD-10-CM | POA: Diagnosis not present

## 2018-02-19 DIAGNOSIS — J449 Chronic obstructive pulmonary disease, unspecified: Secondary | ICD-10-CM

## 2018-02-19 MED ORDER — PREDNISONE 10 MG PO TABS: ORAL_TABLET | ORAL | 0 refills | Status: AC

## 2018-02-19 MED ORDER — AZITHROMYCIN 250 MG PO TABS: ORAL_TABLET | ORAL | 0 refills | Status: AC

## 2018-02-19 NOTE — Patient Instructions (Signed)
.   Please review the attached list of medications and notify my office if there are any errors.   . Please bring all of your medications to every appointment so we can make sure that our medication list is the same as yours.   

## 2018-02-19 NOTE — Progress Notes (Signed)
Patient: Samantha Henry Female    DOB: 08/26/1945   73 y.o.   MRN: [id number] Visit Date: 02/19/2018  Today's Provider: Lelon Huh, MD   Chief Complaint  Patient presents with  . URI    Started about a week ago.   Subjective:   URI   This is a new problem. The current episode started in the past 7 days. The problem has been gradually worsening. There has been no fever. Associated symptoms include coughing and wheezing. Pertinent negatives include no abdominal pain, chest pain, congestion, diarrhea, ear pain, headaches, nausea, neck pain, plugged ear sensation, rhinorrhea, sinus pain, sneezing, sore throat, swollen glands or vomiting. She has tried inhaler use for the symptoms. The treatment provided mild relief.  Feels a little short of breath when coughing and on exertion.   Allergies  Allergen Reactions  . Aspirin     AVOIDs due to history of Von Willebrands  . Gabapentin     dizzy  . Lyrica [Pregabalin] Other (See Comments)    Dizziness  . Tetracycline      Current Outpatient Medications:  .  acetaminophen (TYLENOL) 650 MG CR tablet, Take 650 mg by mouth every 8 (eight) hours as needed for pain., Disp: , Rfl:  .  albuterol (PROVENTIL HFA;VENTOLIN HFA) 108 (90 Base) MCG/ACT inhaler, Inhale 2 puffs into the lungs every 6 (six) hours as needed for wheezing or shortness of breath., Disp: , Rfl:  .  aspirin 81 MG tablet, Take 1 tablet by mouth daily., Disp: , Rfl:  .  Blood Glucose Monitoring Suppl (ONE TOUCH ULTRA 2) w/Device KIT, Use daily to check blood sugar for type 2 diabetes E11.9, Disp: 1 each, Rfl: 0 .  budesonide-formoterol (SYMBICORT) 160-4.5 MCG/ACT inhaler, Inhale 2 puffs into the lungs 2 (two) times daily., Disp: , Rfl:  .  calcium carbonate (CALCIUM 600) 600 MG TABS tablet, Take 500 mg by mouth 2 (two) times daily with a meal. , Disp: , Rfl:  .  calcium carbonate (TUMS EX) 750 MG chewable tablet, Chew 2 tablets by mouth 2 (two) times daily as needed for  heartburn., Disp: , Rfl:  .  cholecalciferol (VITAMIN D) 400 units TABS tablet, Take 400 Units by mouth daily., Disp: , Rfl:  .  levothyroxine (SYNTHROID, LEVOTHROID) 125 MCG tablet, TAKE 1 TABLET BY MOUTH ONCE DAILY BEFORE BREAKFAST, Disp: 90 tablet, Rfl: 4 .  losartan (COZAAR) 50 MG tablet, TAKE 1 TABLET BY MOUTH ONCE DAILY, Disp: 90 tablet, Rfl: 1 .  metFORMIN (GLUCOPHAGE-XR) 500 MG 24 hr tablet, TAKE 2 TABLETS BY MOUTH IN THE MORNING, THEN 2 TABLETS BY MOUTH IN THE EVENING, Disp: 360 tablet, Rfl: 4 .  methocarbamol (ROBAXIN) 500 MG tablet, Take 2 tablets (1,000 mg total) by mouth every 8 (eight) hours as needed for muscle spasms., Disp: 60 tablet, Rfl: 3 .  MULTIPLE VITAMIN PO, Take 1 tablet by mouth daily., Disp: , Rfl:  .  naproxen (NAPROSYN) 375 MG tablet, TAKE 1 TABLET BY MOUTH TWICE DAILY WITH A MEAL FOR 10 DAYS (Patient taking differently: Take 500 mg by mouth 2 (two) times daily with a meal. ), Disp: 60 tablet, Rfl: 3 .  Omega-3 Fatty Acids (FISH OIL BURP-LESS) 1000 MG CAPS, Take 1 tablet by mouth 2 (two) times daily. , Disp: , Rfl:  .  oxymetazoline (AFRIN) 0.05 % nasal spray, Place 2 sprays into both nostrils at bedtime., Disp: , Rfl:  .  polyethylene glycol (MIRALAX / GLYCOLAX)  packet, Take 17 g by mouth daily as needed., Disp: , Rfl:  .  pseudoephedrine-dextromethorphan-guaifenesin (ROBITUSSIN-PE) 30-10-100 MG/5ML solution, Take 10 mLs by mouth 4 (four) times daily as needed for cough., Disp: , Rfl:  .  RABEprazole (ACIPHEX) 20 MG tablet, TAKE 1 TABLET BY MOUTH ONCE DAILY, Disp: 30 tablet, Rfl: 12 .  rosuvastatin (CRESTOR) 20 MG tablet, TAKE 1 TABLET BY MOUTH ONCE DAILY, Disp: 90 tablet, Rfl: 3 .  Tiotropium Bromide Monohydrate (SPIRIVA RESPIMAT) 1.25 MCG/ACT AERS, Inhale 1 spray into the lungs every morning., Disp: , Rfl:  .  trolamine salicylate (ASPERCREME) 10 % cream, Apply 1 application topically as needed for muscle pain., Disp: , Rfl:  .  vitamin B-12 (CYANOCOBALAMIN) 50 MCG  tablet, Take 50 mcg by mouth daily., Disp: , Rfl:  .  diclofenac (FLECTOR) 1.3 % PTCH, Place 1 patch onto the skin 2 (two) times daily. (Patient not taking: Reported on 02/19/2018), Disp: 60 patch, Rfl: 5 .  DULoxetine HCl 40 MG CPEP, Take by mouth daily., Disp: , Rfl:  .  glucose blood (ONE TOUCH ULTRA TEST) test strip, Use as instructed to check blood sugar daily for type 2 diabetes E11.9, Disp: 100 each, Rfl: 4 .  mometasone-formoterol (DULERA) 200-5 MCG/ACT AERO, Inhale 2 puffs 2 (two) times daily into the lungs., Disp: , Rfl:  .  tiZANidine (ZANAFLEX) 4 MG tablet, Take by mouth., Disp: , Rfl:   Review of Systems  Constitutional: Positive for diaphoresis and fatigue. Negative for activity change, appetite change, chills, fever and unexpected weight change.  HENT: Positive for postnasal drip. Negative for congestion, ear discharge, ear pain, rhinorrhea, sinus pressure, sinus pain, sneezing, sore throat, tinnitus, trouble swallowing and voice change.   Eyes: Negative.   Respiratory: Positive for cough, shortness of breath and wheezing. Negative for apnea, choking, chest tightness and stridor.   Cardiovascular: Negative for chest pain.  Gastrointestinal: Negative.  Negative for abdominal pain, diarrhea, nausea and vomiting.  Musculoskeletal: Negative for neck pain and neck stiffness.  Neurological: Negative for dizziness, light-headedness and headaches.  Hematological: Negative for adenopathy.    Social History   Tobacco Use  . Smoking status: Former Smoker    Packs/day: 1.50    Years: 48.00    Pack years: 72.00    Types: Cigarettes    Last attempt to quit: 01/11/2007    Years since quitting: 11.1  . Smokeless tobacco: Never Used  . Tobacco comment: smoked up to 2 ppd since 73 yo. quit in 2009  Substance Use Topics  . Alcohol use: No    Alcohol/week: 0.0 standard drinks      Objective:   BP 134/62 (BP Location: Right Arm, Patient Position: Sitting, Cuff Size: Large)   Pulse 88    Temp 98.4 F (36.9 C) (Oral)   Wt 207 lb (93.9 kg)   SpO2 94%   BMI 33.41 kg/m  Vitals:   02/19/18 1509  BP: 134/62  Pulse: 88  Temp: 98.4 F (36.9 C)  TempSrc: Oral  SpO2: 94%  Weight: 207 lb (93.9 kg)     Physical Exam   General Appearance:    Alert, cooperative, no distress  HENT:   ENT exam normal, no neck nodes or sinus tenderness  Eyes:    PERRL, conjunctiva/corneas clear, EOM's intact       Lungs:     Occasional expiratory wheeze, no rales, , respirations unlabored  Heart:    Regular rate and rhythm  Neurologic:   Awake, alert, oriented x  3. No apparent focal neurological           defect.          Assessment & Plan    1. Chronic obstructive pulmonary disease, unspecified COPD type ([place])  - predniSONE (DELTASONE) 10 MG tablet; 6 tablets for 1 day, then 5 for 1 day, then 4 for 1 day, then 3 for 1 day, then 2 for 1 day then 1 for 1 day.  Dispense: 21 tablet; Refill: 0  2. Bronchitis  - azithromycin (ZITHROMAX) 250 MG tablet; 2 by mouth today, then 1 daily for 4 days  Dispense: 6 tablet; Refill: 0  Can continue albuterol inhaler prn and OTC tussin.  Call if symptoms change or if not rapidly improving.       Lelon Huh, MD  [facility]

## 2018-02-21 ENCOUNTER — Telehealth: Payer: Self-pay | Admitting: Family Medicine

## 2018-02-21 MED ORDER — ALBUTEROL SULFATE HFA 108 (90 BASE) MCG/ACT IN AERS: 2.0000 | INHALATION_SPRAY | Freq: Four times a day (QID) | RESPIRATORY_TRACT | 3 refills | Status: DC | PRN

## 2018-02-21 NOTE — Telephone Encounter (Signed)
Pt advised.  She states she needs a refill on albuterol inhaler.   Pt uses [facility].  Thanks,   -Mickel Baas

## 2018-02-21 NOTE — Telephone Encounter (Signed)
Pt needing to be clear on how to take the new and old medications.  Please call pt back to advise asap.  Thanks, [employer]

## 2018-03-08 ENCOUNTER — Telehealth: Payer: Self-pay | Admitting: Family Medicine

## 2018-03-08 MED ORDER — PREDNISONE 10 MG PO TABS: ORAL_TABLET | ORAL | 0 refills | Status: AC

## 2018-03-08 NOTE — Telephone Encounter (Signed)
Pt called back to check on her questions.  Please call pt back today asap before 5 pm today.  Thanks, [employer]

## 2018-03-08 NOTE — Telephone Encounter (Signed)
I called and spoke with patient. She states her symptoms did improve when she was on the medication, but flared back up a few days after finishing it. She denies any fever or chest pain. The main symptoms she complains of is shortness of breath and wheezing during exertion and at night when laying flat in the bed. Patient says she has been using the Albuterol that you gave her and OTC Robitussin (which has helped with cough). Please advise. Patient uses [facility] [address])

## 2018-03-08 NOTE — Telephone Encounter (Signed)
Pt was in about two weeks ago for a cough.  She is still coughing and wants to know if there is something else you can send to her.  She uses [address]  CB#  [phone]  Thanks  Con Memos

## 2018-03-08 NOTE — Telephone Encounter (Signed)
Patient advised and verbally voiced understanding. Patient states she doesn't have Dulera. She only has the Spiriva and the Albuterol inhaler you gave her. She doesn't remember ever having Dulera. She states that medication isn't listed on her after visit summary. It looks like the medication for Ruthe Mannan was last entered historically by [employer].

## 2018-03-08 NOTE — Telephone Encounter (Signed)
Please review

## 2018-03-08 NOTE — Telephone Encounter (Signed)
Have sent another round [facility] which should help. Also, make sure she is still using Dulera and Spiriva inhalers.  If sx don't resolve with prednisone, or if they come back again after finishing then she needs to return for reevaluation.

## 2018-03-08 NOTE — Telephone Encounter (Signed)
Did symptoms improve and return when she finished medications? Or did they never improve in the first place? Any fever, chest pain or shortness of breath?

## 2018-03-09 MED ORDER — BUDESONIDE-FORMOTEROL FUMARATE 160-4.5 MCG/ACT IN AERO: 2.0000 | INHALATION_SPRAY | Freq: Two times a day (BID) | RESPIRATORY_TRACT | 12 refills | Status: DC

## 2018-03-09 NOTE — Telephone Encounter (Signed)
What about Symbicort? She needs to be it if she isn't using dulera.

## 2018-03-09 NOTE — Telephone Encounter (Signed)
Patient reports that she does have Symbicort, but does not take it because we told her not to take with albuterol.

## 2018-03-09 NOTE — Telephone Encounter (Signed)
Please advise that symbicort is a maintenance inhaler and she needs to take it every day to prevent COPD flares. She can still use albuterol when she gets short of breath or cough flares up, but she should need to use it as much if she uses the Symbicort every day.

## 2018-03-09 NOTE — Telephone Encounter (Signed)
Prescription sent. Call if any trouble getting filled or if not covered on her insurance formulart

## 2018-03-09 NOTE — Telephone Encounter (Signed)
Advised patient as below. Patient reports that she only has a small amount left, and is requesting that we send in a refill into the pharmacy. [facility] uses [facility] [address]. Thanks!

## 2018-03-23 ENCOUNTER — Other Ambulatory Visit: Payer: Self-pay

## 2018-03-23 DIAGNOSIS — Z96652 Presence of left artificial knee joint: Secondary | ICD-10-CM | POA: Insufficient documentation

## 2018-03-23 DIAGNOSIS — Z7982 Long term (current) use of aspirin: Secondary | ICD-10-CM | POA: Insufficient documentation

## 2018-03-23 DIAGNOSIS — Z7984 Long term (current) use of oral hypoglycemic drugs: Secondary | ICD-10-CM | POA: Diagnosis not present

## 2018-03-23 DIAGNOSIS — J441 Chronic obstructive pulmonary disease with (acute) exacerbation: Secondary | ICD-10-CM | POA: Insufficient documentation

## 2018-03-23 DIAGNOSIS — Z79899 Other long term (current) drug therapy: Secondary | ICD-10-CM | POA: Diagnosis not present

## 2018-03-23 DIAGNOSIS — Z87891 Personal history of nicotine dependence: Secondary | ICD-10-CM | POA: Diagnosis not present

## 2018-03-23 DIAGNOSIS — E039 Hypothyroidism, unspecified: Secondary | ICD-10-CM | POA: Insufficient documentation

## 2018-03-23 DIAGNOSIS — I10 Essential (primary) hypertension: Secondary | ICD-10-CM | POA: Insufficient documentation

## 2018-03-23 DIAGNOSIS — E119 Type 2 diabetes mellitus without complications: Secondary | ICD-10-CM | POA: Insufficient documentation

## 2018-03-23 DIAGNOSIS — Z8673 Personal history of transient ischemic attack (TIA), and cerebral infarction without residual deficits: Secondary | ICD-10-CM | POA: Insufficient documentation

## 2018-03-23 DIAGNOSIS — R0602 Shortness of breath: Secondary | ICD-10-CM | POA: Diagnosis not present

## 2018-03-24 ENCOUNTER — Emergency Department: Payer: PPO

## 2018-03-24 ENCOUNTER — Encounter: Payer: Self-pay | Admitting: Emergency Medicine

## 2018-03-24 ENCOUNTER — Emergency Department
Admission: EM | Admit: 2018-03-24 | Discharge: 2018-03-24 | Disposition: A | Discharge status: Home / Self Care | Payer: PPO | Attending: Emergency Medicine | Admitting: Emergency Medicine

## 2018-03-24 ENCOUNTER — Other Ambulatory Visit: Payer: Self-pay

## 2018-03-24 DIAGNOSIS — J441 Chronic obstructive pulmonary disease with (acute) exacerbation: Secondary | ICD-10-CM

## 2018-03-24 DIAGNOSIS — R0602 Shortness of breath: Secondary | ICD-10-CM | POA: Diagnosis not present

## 2018-03-24 LAB — CBC
HCT: 33.1 % — ABNORMAL LOW (ref 36.0–46.0)
Hemoglobin: 9.9 g/dL — ABNORMAL LOW (ref 12.0–15.0)
MCH: 23.5 pg — AB (ref 26.0–34.0)
MCHC: 29.9 g/dL — ABNORMAL LOW (ref 30.0–36.0)
MCV: 78.4 fL — ABNORMAL LOW (ref 80.0–100.0)
Platelets: 190 10*3/uL (ref 150–400)
RBC: 4.22 MIL/uL (ref 3.87–5.11)
RDW: 17.9 % — ABNORMAL HIGH (ref 11.5–15.5)
WBC: 7.6 10*3/uL (ref 4.0–10.5)
nRBC: 0 % (ref 0.0–0.2)

## 2018-03-24 LAB — BASIC METABOLIC PANEL
Anion gap: 10 (ref 5–15)
BUN: 10 mg/dL (ref 8–23)
CO2: 24 mmol/L (ref 22–32)
Calcium: 8.8 mg/dL — ABNORMAL LOW (ref 8.9–10.3)
Chloride: 98 mmol/L (ref 98–111)
Creatinine, Ser: 0.49 mg/dL (ref 0.44–1.00)
GFR calc Af Amer: >60 mL/min (ref >60)
GFR calc non Af Amer: >60 mL/min (ref >60)
GLUCOSE: 119 mg/dL — AB (ref 70–99)
Potassium: 3.4 mmol/L — ABNORMAL LOW (ref 3.5–5.1)
Sodium: 132 mmol/L — ABNORMAL LOW (ref 135–145)

## 2018-03-24 MED ORDER — IPRATROPIUM-ALBUTEROL 0.5-2.5 (3) MG/3ML IN SOLN
3.0000 mL | Freq: Once | RESPIRATORY_TRACT | Status: AC
  Administered 2018-03-24: 3 mL via RESPIRATORY_TRACT
  Filled 2018-03-24: qty 3

## 2018-03-24 MED ORDER — PREDNISONE 20 MG PO TABS
60.0000 mg | ORAL_TABLET | Freq: Once | ORAL | Status: AC
  Administered 2018-03-24: 60 mg via ORAL
  Filled 2018-03-24: qty 3

## 2018-03-24 MED ORDER — ALBUTEROL SULFATE (2.5 MG/3ML) 0.083% IN NEBU
5.0000 mg | INHALATION_SOLUTION | Freq: Once | RESPIRATORY_TRACT | Status: AC
  Administered 2018-03-24: 5 mg via RESPIRATORY_TRACT
  Filled 2018-03-24: qty 6

## 2018-03-24 NOTE — ED Notes (Signed)
Pt with SOB when lying down, relieved with sitting up but SOB returns when lying down, pt reports sleeping with one pillow and not in recliner, denies hx of CHF or new swelling in legs  +3 pitting edema in both lower legs (thumb indention remains for approx 1 minute), pt admits to salty snack intake - pt counseled on salt reduction, venous stasis visible at lower legs

## 2018-03-24 NOTE — ED Notes (Signed)
No peripheral IV placed this visit.    Discharge instructions reviewed with patient. Questions fielded by this RN. Patient verbalizes understanding of instructions. Patient discharged home in stable condition per [facility]. No acute distress noted at time of discharge.

## 2018-03-24 NOTE — ED Notes (Signed)
Patient transported to X-ray 

## 2018-03-24 NOTE — Discharge Instructions (Addendum)
Please seek medical attention for any high fevers, chest pain, shortness of breath, change in behavior, persistent vomiting, bloody stool or any other new or concerning symptoms.  

## 2018-03-24 NOTE — ED Provider Notes (Signed)
[facility] Emergency Department Provider Note  ____________________________________________   I have reviewed the triage vital signs and the nursing notes.   HISTORY  Chief Complaint Shortness of Breath   History limited by: Not Limited   HPI Samantha Henry is a 73 y.o. female who presents to the emergency department today because of concerns for continued and slightly worsening shortness of breath.  The patient states that she is been somewhat short of breath for the past 2 weeks.  She had seen her primary care doctor when the symptoms first started and was prescribed a course of steroids and antibiotics.  She states she has not finished those.  For the past few days breathing is gotten worse.  She has multiple inhalers at home which he uses for just minimal relief.  She denies any fevers.  Has had some associated chest tightness.  She has not appreciated any leg swelling.    Per medical record review patient has a history of COPD  Past Medical History:  Diagnosis Date  . Arthritis    osteo  . Cancer ([place])    CERVICAL CA  . COPD (chronic obstructive pulmonary disease) ([place])   . Diabetes mellitus without complication ([place])   . Diabetic peripheral neuropathy ([place])   . Environmental and seasonal allergies   . GERD (gastroesophageal reflux disease)   . History of cervical cancer   . Hypercholesteremia   . Hypertension   . Hypothyroidism   . Neuropathy   . Sleep apnea   . TIA (transient ischemic attack)   . Von Willebrand's disease [facility])     Patient Active Problem List   Diagnosis Date Noted  . DDD (degenerative disc disease), cervical 02/01/2018  . Chronic back pain 02/01/2018  . Morbid obesity ([place]) 06/26/2017  . History of smoking 30 or more pack years 05/12/2017  . Status post total knee replacement using cement, left 06/14/2016  . Polyneuropathy 02/26/2015  . Osteoarthritis 08/02/2014  . Allergic rhinitis 07/29/2014  . COPD (chronic  obstructive pulmonary disease) ([place]) 07/25/2014  . Type 2 diabetes mellitus with neurological complications ([place]) 02/63/7858  . Diverticulosis of colon 07/25/2014  . Edema 07/25/2014  . GERD (gastroesophageal reflux disease) 07/25/2014  . Hypercholesteremia 07/25/2014  . Hypertension 07/25/2014  . Hypothyroid 07/25/2014  . Cramps of lower extremity 07/25/2014  . Microalbuminuria 07/25/2014  . Nail dystrophy 07/25/2014  . Osteoarthritis of leg 07/25/2014  . Restless legs syndrome 07/25/2014  . TIA (transient ischemic attack) 07/25/2014  . Von Willebrand's disease ([place]) 07/25/2014  . OSA (obstructive sleep apnea) 10/31/2013  . History of adenomatous polyp of colon 08/26/2005    Past Surgical History:  Procedure Laterality Date  . ABDOMINAL HYSTERECTOMY  1971   due to cervical cancer   . APPENDECTOMY    . BLADDER SURGERY  1997   bladder tacking  . BREAST BIOPSY Left 01/21/2015   FIBROADENOMATOUS CHANGE WITH ASSOCIATED MICROCALCIFICATIONS  . CARDIAC CATHETERIZATION    . CARPAL TUNNEL RELEASE  2010  . colonoscopy and upper endoscopy'    . COLONOSCOPY WITH PROPOFOL N/A 02/25/2016   Procedure: COLONOSCOPY WITH PROPOFOL;  Surgeon: Lollie Sails, MD;  Location: [facility] ENDOSCOPY;  Service: Endoscopy;  Laterality: N/A;  . OOPHORECTOMY    . TENDON RELEASE  2010   right foot, Dr. Vickki Muff   . TOTAL KNEE ARTHROPLASTY Left 06/14/2016   Procedure: TOTAL KNEE ARTHROPLASTY;  Surgeon: Corky Mull, MD;  Location: [facility] ORS;  Service: Orthopedics;  Laterality: Left;  Prior to Admission medications   Medication Sig Start Date End Date Taking? Authorizing Provider  acetaminophen (TYLENOL) 650 MG CR tablet Take 650 mg by mouth every 8 (eight) hours as needed for pain.    [provider]  albuterol (PROVENTIL HFA;VENTOLIN HFA) 108 (90 Base) MCG/ACT inhaler Inhale 2 puffs into the lungs every 6 (six) hours as needed for wheezing or shortness of breath. 02/21/18   Birdie Sons, MD   aspirin 81 MG tablet Take 1 tablet by mouth daily.    [provider]  Blood Glucose Monitoring Suppl (ONE TOUCH ULTRA 2) w/Device KIT Use daily to check blood sugar for type 2 diabetes E11.9 01/11/18   Birdie Sons, MD  budesonide-formoterol [facility]) 160-4.5 MCG/ACT inhaler Inhale 2 puffs into the lungs 2 (two) times daily. 03/09/18   Birdie Sons, MD  calcium carbonate (CALCIUM 600) 600 MG TABS tablet Take 500 mg by mouth 2 (two) times daily with a meal.     [provider]  calcium carbonate (TUMS EX) 750 MG chewable tablet Chew 2 tablets by mouth 2 (two) times daily as needed for heartburn.    [provider]  cholecalciferol (VITAMIN D) 400 units TABS tablet Take 400 Units by mouth daily.    [provider]  diclofenac (FLECTOR) 1.3 % PTCH Place 1 patch onto the skin 2 (two) times daily. Patient not taking: Reported on 02/19/2018 01/11/18   Birdie Sons, MD  DULoxetine HCl 40 MG CPEP Take by mouth daily.    [provider]  glucose blood (ONE TOUCH ULTRA TEST) test strip Use as instructed to check blood sugar daily for type 2 diabetes E11.9 11/28/17   Birdie Sons, MD  levothyroxine (SYNTHROID, LEVOTHROID) 125 MCG tablet TAKE 1 TABLET BY MOUTH ONCE DAILY BEFORE BREAKFAST 10/09/17   Birdie Sons, MD  losartan (COZAAR) 50 MG tablet TAKE 1 TABLET BY MOUTH ONCE DAILY 12/08/17   Birdie Sons, MD  metFORMIN (GLUCOPHAGE-XR) 500 MG 24 hr tablet TAKE 2 TABLETS BY MOUTH IN THE MORNING, THEN 2 TABLETS BY MOUTH IN THE EVENING 01/11/18   Birdie Sons, MD  methocarbamol (ROBAXIN) 500 MG tablet Take 2 tablets (1,000 mg total) by mouth every 8 (eight) hours as needed for muscle spasms. 02/01/18   Birdie Sons, MD  MULTIPLE VITAMIN PO Take 1 tablet by mouth daily.    [provider]  naproxen (NAPROSYN) 375 MG tablet TAKE 1 TABLET BY MOUTH TWICE DAILY WITH A MEAL FOR 10 DAYS Patient taking differently: Take 500 mg by mouth 2 (two)  times daily with a meal.  03/28/17   Fisher, Kirstie Peri, MD  Omega-3 Fatty Acids (FISH OIL BURP-LESS) 1000 MG CAPS Take 1 tablet by mouth 2 (two) times daily.     [provider]  oxymetazoline (AFRIN) 0.05 % nasal spray Place 2 sprays into both nostrils at bedtime.    [provider]  polyethylene glycol (MIRALAX / GLYCOLAX) packet Take 17 g by mouth daily as needed.    [provider]  pseudoephedrine-dextromethorphan-guaifenesin (ROBITUSSIN-PE) 30-10-100 MG/5ML solution Take 10 mLs by mouth 4 (four) times daily as needed for cough.    [provider]  RABEprazole (ACIPHEX) 20 MG tablet TAKE 1 TABLET BY MOUTH ONCE DAILY 04/07/17   Birdie Sons, MD  rosuvastatin (CRESTOR) 20 MG tablet TAKE 1 TABLET BY MOUTH ONCE DAILY 12/19/17   Birdie Sons, MD  Tiotropium Bromide Monohydrate (SPIRIVA RESPIMAT) 1.25 MCG/ACT  AERS Inhale 1 spray into the lungs every morning.    [provider]  tiZANidine (ZANAFLEX) 4 MG tablet Take by mouth. 06/12/17 06/12/18  [provider]  trolamine salicylate (ASPERCREME) 10 % cream Apply 1 application topically as needed for muscle pain.    [provider]  vitamin B-12 (CYANOCOBALAMIN) 50 MCG tablet Take 50 mcg by mouth daily.    [provider]    Allergies Aspirin; Gabapentin; Lyrica [pregabalin]; and Tetracycline  Family History  Problem Relation Age of Onset  . Alzheimer's disease Mother   . Cancer Father   . Diabetes Sister   . Diabetes Brother   . Cancer Sister 38       melanoma  . Breast cancer Maternal Aunt   . Hodgkin's lymphoma Sister     Social History Social History   Tobacco Use  . Smoking status: Former Smoker    Packs/day: 1.50    Years: 48.00    Pack years: 72.00    Types: Cigarettes    Last attempt to quit: 01/11/2007    Years since quitting: 11.2  . Smokeless tobacco: Never Used  . Tobacco comment: smoked up to 2 ppd since 73 yo. quit in 2009  Substance Use Topics   . Alcohol use: No    Alcohol/week: 0.0 standard drinks  . Drug use: No    Review of Systems Constitutional: No fever/chills Eyes: No visual changes. ENT: No sore throat. Cardiovascular: Positive for chest pain. Respiratory: Positive for shortness of breath. Gastrointestinal: No abdominal pain.  No nausea, no vomiting.  No diarrhea.   Genitourinary: Negative for dysuria. Musculoskeletal: Negative for back pain. Skin: Negative for rash. Neurological: Negative for headaches, focal weakness or numbness.  ____________________________________________   PHYSICAL EXAM:  VITAL SIGNS: ED Triage Vitals  Enc Vitals Group     BP 03/24/18 0011 (!) 149/52     Pulse Rate 03/24/18 0011 92     Resp 03/24/18 0011 18     Temp 03/24/18 0011 98.8 F (37.1 C)     Temp Source 03/24/18 0011 Oral     SpO2 03/24/18 0011 95 %     Weight 03/24/18 0012 214 lb (97.1 kg)     Height 03/24/18 0012 _0  (1.702 m)     Head Circumference --      Peak Flow --      Pain Score 03/24/18 0012 0   Constitutional: Alert and oriented.  Eyes: Conjunctivae are normal.  ENT      Head: Normocephalic and atraumatic.      Nose: No congestion/rhinnorhea.      Mouth/Throat: Mucous membranes are moist.      Neck: No stridor. Hematological/Lymphatic/Immunilogical: No cervical lymphadenopathy. Cardiovascular: Normal rate, regular rhythm.  No murmurs, rubs, or gallops.  Respiratory: Normal respiratory effort without tachypnea nor retractions. Diffuse expiratory wheezing. Gastrointestinal: Soft and non tender. No rebound. No guarding.  Genitourinary: Deferred Musculoskeletal: Normal range of motion in all extremities. No lower extremity edema. Neurologic:  Normal speech and language. No gross focal neurologic deficits are appreciated.  Skin:  Skin is warm, dry and intact. No rash noted. Psychiatric: Mood and affect are normal. Speech and behavior are normal. Patient exhibits appropriate insight and  judgment.  ____________________________________________    LABS (pertinent positives/negatives)  CBC wbc 7.6, hgb 9.9, plt 190 BMP na 132, k 3.4, glu 119, cr 0.49  ____________________________________________   EKG  I, Nance Pear, attending physician, personally viewed and interpreted this EKG  EKG Time:  0016 Rate: 94 Rhythm: normal sinus rhythm Axis: normal Intervals: qtc 427 QRS: incomplete RBBB ST changes: no st elevation Impression: abnormal ekg   ____________________________________________    RADIOLOGY  CXR Normal chest  ____________________________________________   PROCEDURES  Procedures  ____________________________________________   INITIAL IMPRESSION / ASSESSMENT AND PLAN / ED COURSE  Pertinent labs & imaging results that were available during my care of the patient were reviewed by me and considered in my medical decision making (see chart for details).   Patient presented to the emergency department today because of concerns for shortness of breath.  Patient does have a history of COPD.  Chest x-ray without concerning signs of pneumonia.  Blood work shows slight anemia, however this appears to be patient's baseline. Patient did feel better after medication. Will plan on discharging with prescription for steroids. Discussed with patient that she should follow up with her physician.  ____________________________________________   FINAL CLINICAL IMPRESSION(S) / ED DIAGNOSES  Final diagnoses:  COPD exacerbation ([place])     Note: This dictation was prepared with Dragon dictation. Any transcriptional errors that result from this process are unintentional     Nance Pear, MD 03/24/18 [phone]

## 2018-03-24 NOTE — ED Triage Notes (Signed)
Pt arrives POV to triage with c/o SOB x 2 weeks. Pt states that her PCP gave her inhalers at that time but when she gets short of breath "I just panic". Pt is in NAD and talking in full sentences and paragraphs without need to pause.

## 2018-03-27 DIAGNOSIS — R0902 Hypoxemia: Secondary | ICD-10-CM | POA: Diagnosis not present

## 2018-03-27 DIAGNOSIS — R0609 Other forms of dyspnea: Secondary | ICD-10-CM | POA: Diagnosis not present

## 2018-03-27 DIAGNOSIS — R918 Other nonspecific abnormal finding of lung field: Secondary | ICD-10-CM | POA: Diagnosis not present

## 2018-03-27 DIAGNOSIS — J181 Lobar pneumonia, unspecified organism: Secondary | ICD-10-CM | POA: Diagnosis not present

## 2018-03-27 DIAGNOSIS — J449 Chronic obstructive pulmonary disease, unspecified: Secondary | ICD-10-CM | POA: Diagnosis not present

## 2018-04-16 ENCOUNTER — Other Ambulatory Visit: Payer: Self-pay | Admitting: Family Medicine

## 2018-04-16 ENCOUNTER — Encounter: Payer: Self-pay | Admitting: *Deleted

## 2018-04-16 NOTE — Telephone Encounter (Signed)
Please Review

## 2018-04-24 DIAGNOSIS — K439 Ventral hernia without obstruction or gangrene: Secondary | ICD-10-CM | POA: Diagnosis not present

## 2018-04-24 DIAGNOSIS — R0602 Shortness of breath: Secondary | ICD-10-CM | POA: Diagnosis not present

## 2018-04-24 DIAGNOSIS — R918 Other nonspecific abnormal finding of lung field: Secondary | ICD-10-CM | POA: Diagnosis not present

## 2018-04-24 DIAGNOSIS — R600 Localized edema: Secondary | ICD-10-CM | POA: Diagnosis not present

## 2018-04-24 DIAGNOSIS — J449 Chronic obstructive pulmonary disease, unspecified: Secondary | ICD-10-CM | POA: Diagnosis not present

## 2018-04-24 DIAGNOSIS — R351 Nocturia: Secondary | ICD-10-CM | POA: Diagnosis not present

## 2018-04-24 DIAGNOSIS — F0390 Unspecified dementia without behavioral disturbance: Secondary | ICD-10-CM | POA: Diagnosis not present

## 2018-04-24 DIAGNOSIS — I2510 Atherosclerotic heart disease of native coronary artery without angina pectoris: Secondary | ICD-10-CM | POA: Diagnosis not present

## 2018-04-24 DIAGNOSIS — K4090 Unilateral inguinal hernia, without obstruction or gangrene, not specified as recurrent: Secondary | ICD-10-CM | POA: Diagnosis not present

## 2018-04-27 DIAGNOSIS — J449 Chronic obstructive pulmonary disease, unspecified: Secondary | ICD-10-CM | POA: Diagnosis not present

## 2018-05-10 DIAGNOSIS — J449 Chronic obstructive pulmonary disease, unspecified: Secondary | ICD-10-CM | POA: Diagnosis not present

## 2018-05-27 DIAGNOSIS — J449 Chronic obstructive pulmonary disease, unspecified: Secondary | ICD-10-CM | POA: Diagnosis not present

## 2018-06-08 ENCOUNTER — Other Ambulatory Visit: Payer: Self-pay

## 2018-06-08 ENCOUNTER — Emergency Department
Admission: EM | Admit: 2018-06-08 | Discharge: 2018-06-09 | Disposition: A | Discharge status: Home / Self Care | Payer: PPO | Attending: Emergency Medicine | Admitting: Emergency Medicine

## 2018-06-08 ENCOUNTER — Emergency Department: Payer: PPO

## 2018-06-08 DIAGNOSIS — I10 Essential (primary) hypertension: Secondary | ICD-10-CM | POA: Insufficient documentation

## 2018-06-08 DIAGNOSIS — Z8673 Personal history of transient ischemic attack (TIA), and cerebral infarction without residual deficits: Secondary | ICD-10-CM | POA: Insufficient documentation

## 2018-06-08 DIAGNOSIS — Z7984 Long term (current) use of oral hypoglycemic drugs: Secondary | ICD-10-CM | POA: Diagnosis not present

## 2018-06-08 DIAGNOSIS — E039 Hypothyroidism, unspecified: Secondary | ICD-10-CM | POA: Insufficient documentation

## 2018-06-08 DIAGNOSIS — Z96652 Presence of left artificial knee joint: Secondary | ICD-10-CM | POA: Diagnosis not present

## 2018-06-08 DIAGNOSIS — Z87891 Personal history of nicotine dependence: Secondary | ICD-10-CM | POA: Diagnosis not present

## 2018-06-08 DIAGNOSIS — R0600 Dyspnea, unspecified: Secondary | ICD-10-CM | POA: Diagnosis not present

## 2018-06-08 DIAGNOSIS — Z79899 Other long term (current) drug therapy: Secondary | ICD-10-CM | POA: Diagnosis not present

## 2018-06-08 DIAGNOSIS — R9431 Abnormal electrocardiogram [ECG] [EKG]: Secondary | ICD-10-CM | POA: Diagnosis not present

## 2018-06-08 DIAGNOSIS — R0602 Shortness of breath: Secondary | ICD-10-CM | POA: Diagnosis present

## 2018-06-08 DIAGNOSIS — J441 Chronic obstructive pulmonary disease with (acute) exacerbation: Secondary | ICD-10-CM | POA: Diagnosis not present

## 2018-06-08 LAB — CBC WITH DIFFERENTIAL/PLATELET
Abs Immature Granulocytes: 0.02 10*3/uL (ref 0.00–0.07)
Basophils Absolute: 0.1 10*3/uL (ref 0.0–0.1)
Basophils Relative: 1 %
Eosinophils Absolute: 0.6 10*3/uL — ABNORMAL HIGH (ref 0.0–0.5)
Eosinophils Relative: 9 %
HCT: 32.2 % — ABNORMAL LOW (ref 36.0–46.0)
Hemoglobin: 9.6 g/dL — ABNORMAL LOW (ref 12.0–15.0)
Immature Granulocytes: 0 %
Lymphocytes Relative: 36 %
Lymphs Abs: 2.5 10*3/uL (ref 0.7–4.0)
MCH: 24.1 pg — ABNORMAL LOW (ref 26.0–34.0)
MCHC: 29.8 g/dL — ABNORMAL LOW (ref 30.0–36.0)
MCV: 80.9 fL (ref 80.0–100.0)
Monocytes Absolute: 0.9 10*3/uL (ref 0.1–1.0)
Monocytes Relative: 12 %
Neutro Abs: 3 10*3/uL (ref 1.7–7.7)
Neutrophils Relative %: 42 %
Platelets: 184 10*3/uL (ref 150–400)
RBC: 3.98 MIL/uL (ref 3.87–5.11)
RDW: 18.2 % — ABNORMAL HIGH (ref 11.5–15.5)
WBC: 7.1 10*3/uL (ref 4.0–10.5)
nRBC: 0 % (ref 0.0–0.2)

## 2018-06-08 LAB — BASIC METABOLIC PANEL
Anion gap: 9 (ref 5–15)
BUN: 8 mg/dL (ref 8–23)
CO2: 27 mmol/L (ref 22–32)
Calcium: 8.9 mg/dL (ref 8.9–10.3)
Chloride: 103 mmol/L (ref 98–111)
Creatinine, Ser: 0.48 mg/dL (ref 0.44–1.00)
GFR calc Af Amer: >60 mL/min (ref >60)
GFR calc non Af Amer: >60 mL/min (ref >60)
Glucose, Bld: 95 mg/dL (ref 70–99)
Potassium: 3.7 mmol/L (ref 3.5–5.1)
Sodium: 139 mmol/L (ref 135–145)

## 2018-06-08 LAB — TROPONIN I: Troponin I: <0.03 ng/mL (ref <0.03)

## 2018-06-08 MED ORDER — MAGNESIUM SULFATE 2 GM/50ML IV SOLN
2.0000 g | Freq: Once | INTRAVENOUS | Status: AC
  Administered 2018-06-08: 2 g via INTRAVENOUS
  Filled 2018-06-08: qty 50

## 2018-06-08 MED ORDER — PREDNISONE 20 MG PO TABS: 40.0000 mg | ORAL_TABLET | Freq: Every day | ORAL | 0 refills | Status: DC

## 2018-06-08 MED ORDER — METHYLPREDNISOLONE SODIUM SUCC 125 MG IJ SOLR
125.0000 mg | Freq: Once | INTRAMUSCULAR | Status: AC
  Administered 2018-06-08: 125 mg via INTRAVENOUS
  Filled 2018-06-08: qty 2

## 2018-06-08 NOTE — ED Notes (Signed)
Pt refuses COVID-19 test at this time

## 2018-06-08 NOTE — Discharge Instructions (Addendum)
Please seek medical attention for any high fevers, chest pain, shortness of breath, change in behavior, persistent vomiting, bloody stool or any other new or concerning symptoms.  

## 2018-06-08 NOTE — ED Triage Notes (Signed)
Patient c/o SOB X 2 weeks with worsening severity today.

## 2018-06-08 NOTE — ED Notes (Signed)
Helped pt to restroom pt walked one step from bed to wheelchair pt became short of breath oxygen saturation dropped to 88% in 2 L/[place]

## 2018-06-08 NOTE — ED Provider Notes (Signed)
[facility] Emergency Department Provider Note  ____________________________________________   I have reviewed the triage vital signs and the nursing notes.   HISTORY  Chief Complaint Shortness of Breath   History limited by: Not Limited   HPI Samantha Henry is a 73 y.o. female who presents to the emergency department today because of concerns for shortness of breath.  Patient states that she has been feeling short of breath for the past couple of weeks.  This unclear that anything was particularly different today that made her seek care.  She does state that the shortness of breath is worse when she walks.  Does have a history of COPD is on 2 L of oxygen at baseline.  She has been trying her home nebulizer treatments without any significant relief.  Patient denies any fevers.   Records reviewed. Per medical record review patient has a history of COPD.   Past Medical History:  Diagnosis Date  . Arthritis    osteo  . Cancer ([place])    CERVICAL CA  . COPD (chronic obstructive pulmonary disease) ([place])   . Diabetes mellitus without complication ([place])   . Diabetic peripheral neuropathy ([place])   . Environmental and seasonal allergies   . GERD (gastroesophageal reflux disease)   . History of cervical cancer   . Hypercholesteremia   . Hypertension   . Hypothyroidism   . Neuropathy   . Sleep apnea   . TIA (transient ischemic attack)   . Von Willebrand's disease [facility])     Patient Active Problem List   Diagnosis Date Noted  . DDD (degenerative disc disease), cervical 02/01/2018  . Chronic back pain 02/01/2018  . Morbid obesity ([place]) 06/26/2017  . History of smoking 30 or more pack years 05/12/2017  . Status post total knee replacement using cement, left 06/14/2016  . Polyneuropathy 02/26/2015  . Osteoarthritis 08/02/2014  . Allergic rhinitis 07/29/2014  . COPD (chronic obstructive pulmonary disease) ([place]) 07/25/2014  . Type 2 diabetes mellitus with  neurological complications ([place]) 75/91/6384  . Diverticulosis of colon 07/25/2014  . Edema 07/25/2014  . GERD (gastroesophageal reflux disease) 07/25/2014  . Hypercholesteremia 07/25/2014  . Hypertension 07/25/2014  . Hypothyroid 07/25/2014  . Cramps of lower extremity 07/25/2014  . Microalbuminuria 07/25/2014  . Nail dystrophy 07/25/2014  . Osteoarthritis of leg 07/25/2014  . Restless legs syndrome 07/25/2014  . TIA (transient ischemic attack) 07/25/2014  . Von Willebrand's disease ([place]) 07/25/2014  . OSA (obstructive sleep apnea) 10/31/2013  . History of adenomatous polyp of colon 08/26/2005    Past Surgical History:  Procedure Laterality Date  . ABDOMINAL HYSTERECTOMY  1971   due to cervical cancer   . APPENDECTOMY    . BLADDER SURGERY  1997   bladder tacking  . BREAST BIOPSY Left 01/21/2015   FIBROADENOMATOUS CHANGE WITH ASSOCIATED MICROCALCIFICATIONS  . CARDIAC CATHETERIZATION    . CARPAL TUNNEL RELEASE  2010  . colonoscopy and upper endoscopy'    . COLONOSCOPY WITH PROPOFOL N/A 02/25/2016   Procedure: COLONOSCOPY WITH PROPOFOL;  Surgeon: Lollie Sails, MD;  Location: [facility] ENDOSCOPY;  Service: Endoscopy;  Laterality: N/A;  . OOPHORECTOMY    . TENDON RELEASE  2010   right foot, Dr. Vickki Muff   . TOTAL KNEE ARTHROPLASTY Left 06/14/2016   Procedure: TOTAL KNEE ARTHROPLASTY;  Surgeon: Corky Mull, MD;  Location: [facility] ORS;  Service: Orthopedics;  Laterality: Left;    Prior to Admission medications   Medication Sig Start Date End Date Taking? Authorizing  Provider  acetaminophen (TYLENOL) 650 MG CR tablet Take 650 mg by mouth every 8 (eight) hours as needed for pain.    [provider]  albuterol (PROVENTIL HFA;VENTOLIN HFA) 108 (90 Base) MCG/ACT inhaler Inhale 2 puffs into the lungs every 6 (six) hours as needed for wheezing or shortness of breath. 02/21/18   Birdie Sons, MD  aspirin 81 MG tablet Take 1 tablet by mouth daily.    [provider]  Blood  Glucose Monitoring Suppl (ONE TOUCH ULTRA 2) w/Device KIT Use daily to check blood sugar for type 2 diabetes E11.9 01/11/18   Birdie Sons, MD  budesonide-formoterol [facility]) 160-4.5 MCG/ACT inhaler Inhale 2 puffs into the lungs 2 (two) times daily. 03/09/18   Birdie Sons, MD  calcium carbonate (CALCIUM 600) 600 MG TABS tablet Take 500 mg by mouth 2 (two) times daily with a meal.     [provider]  calcium carbonate (TUMS EX) 750 MG chewable tablet Chew 2 tablets by mouth 2 (two) times daily as needed for heartburn.    [provider]  cholecalciferol (VITAMIN D) 400 units TABS tablet Take 400 Units by mouth daily.    [provider]  diclofenac (FLECTOR) 1.3 % PTCH Place 1 patch onto the skin 2 (two) times daily. Patient not taking: Reported on 02/19/2018 01/11/18   Birdie Sons, MD  DULoxetine HCl 40 MG CPEP Take by mouth daily.    [provider]  glucose blood (ONE TOUCH ULTRA TEST) test strip Use as instructed to check blood sugar daily for type 2 diabetes E11.9 11/28/17   Birdie Sons, MD  levothyroxine (SYNTHROID, LEVOTHROID) 125 MCG tablet TAKE 1 TABLET BY MOUTH ONCE DAILY BEFORE BREAKFAST 10/09/17   Birdie Sons, MD  losartan (COZAAR) 50 MG tablet TAKE 1 TABLET BY MOUTH ONCE DAILY 12/08/17   Birdie Sons, MD  metFORMIN (GLUCOPHAGE-XR) 500 MG 24 hr tablet TAKE 2 TABLETS BY MOUTH IN THE MORNING, THEN 2 TABLETS BY MOUTH IN THE EVENING 01/11/18   Birdie Sons, MD  methocarbamol (ROBAXIN) 500 MG tablet Take 2 tablets (1,000 mg total) by mouth every 8 (eight) hours as needed for muscle spasms. 02/01/18   Birdie Sons, MD  MULTIPLE VITAMIN PO Take 1 tablet by mouth daily.    [provider]  naproxen (NAPROSYN) 375 MG tablet TAKE 1 TABLET BY MOUTH TWICE DAILY WITH A MEAL FOR 10 DAYS Patient taking differently: Take 500 mg by mouth 2 (two) times daily with a meal.  03/28/17   Fisher, Kirstie Peri, MD  Omega-3 Fatty Acids (FISH OIL  BURP-LESS) 1000 MG CAPS Take 1 tablet by mouth 2 (two) times daily.     [provider]  oxymetazoline (AFRIN) 0.05 % nasal spray Place 2 sprays into both nostrils at bedtime.    [provider]  polyethylene glycol (MIRALAX / GLYCOLAX) packet Take 17 g by mouth daily as needed.    [provider]  pseudoephedrine-dextromethorphan-guaifenesin (ROBITUSSIN-PE) 30-10-100 MG/5ML solution Take 10 mLs by mouth 4 (four) times daily as needed for cough.    [provider]  RABEprazole (ACIPHEX) 20 MG tablet Take 1 tablet by mouth once daily 04/16/18   Birdie Sons, MD  rosuvastatin (CRESTOR) 20 MG tablet TAKE 1 TABLET BY MOUTH ONCE DAILY 12/19/17   Birdie Sons, MD  Tiotropium Bromide Monohydrate (SPIRIVA RESPIMAT) 1.25 MCG/ACT AERS Inhale 1 spray into the lungs every morning.    [provider]  tiZANidine (ZANAFLEX) 4 MG tablet Take by mouth. 06/12/17 06/12/18  [provider]  trolamine salicylate (ASPERCREME) 10 % cream Apply 1 application topically as needed for muscle pain.    [provider]  vitamin B-12 (CYANOCOBALAMIN) 50 MCG tablet Take 50 mcg by mouth daily.    [provider]    Allergies Aspirin; Gabapentin; Lyrica [pregabalin]; and Tetracycline  Family History  Problem Relation Age of Onset  . Alzheimer's disease Mother   . Cancer Father   . Diabetes Sister   . Diabetes Brother   . Cancer Sister 14       melanoma  . Breast cancer Maternal Aunt   . Hodgkin's lymphoma Sister     Social History Social History   Tobacco Use  . Smoking status: Former Smoker    Packs/day: 1.50    Years: 48.00    Pack years: 72.00    Types: Cigarettes    Last attempt to quit: 01/11/2007    Years since quitting: 11.4  . Smokeless tobacco: Never Used  . Tobacco comment: smoked up to 2 ppd since 73 yo. quit in 2009  Substance Use Topics  . Alcohol use: No    Alcohol/week: 0.0 standard drinks  . Drug use: No    Review of  Systems Constitutional: No fever/chills Eyes: No visual changes. ENT: No sore throat. Cardiovascular: Denies chest pain. Respiratory: Positive for shortness of breath. Gastrointestinal: No abdominal pain.  No nausea, no vomiting.  No diarrhea.   Genitourinary: Negative for dysuria. Musculoskeletal: Negative for back pain. Skin: Negative for rash. Neurological: Negative for headaches, focal weakness or numbness.  ____________________________________________   PHYSICAL EXAM:  VITAL SIGNS: ED Triage Vitals  Enc Vitals Group     BP 06/08/18 1920 (!) 128/57     Pulse Rate 06/08/18 1920 87     Resp 06/08/18 1920 (!) 24     Temp 06/08/18 1920 97.8 F (36.6 C)     Temp src --      SpO2 06/08/18 1920 96 %     Weight 06/08/18 1928 213 lb 13.5 oz (97 kg)     Height 06/08/18 1928 5' 3"  (1.6 m)    Constitutional: Alert and oriented.  Eyes: Conjunctivae are normal.  ENT      Head: Normocephalic and atraumatic.      Nose: No congestion/rhinnorhea.      Mouth/Throat: Mucous membranes are moist.      Neck: No stridor. Hematological/Lymphatic/Immunilogical: No cervical lymphadenopathy. Cardiovascular: Normal rate, regular rhythm.  No murmurs, rubs, or gallops.  Respiratory: Tachypnea. Diffuse expiratory wheezing.  Gastrointestinal: Soft and non tender. No rebound. No guarding.  Genitourinary: Deferred Musculoskeletal: Normal range of motion in all extremities. No lower extremity edema. Neurologic:  Normal speech and language. No gross focal neurologic deficits are appreciated.  Skin:  Skin is warm, dry and intact. No rash noted. Psychiatric: Mood and affect are normal. Speech and behavior are normal. Patient exhibits appropriate insight and judgment.  ____________________________________________    LABS (pertinent positives/negatives)  BMP wnl CBC wbc 7.1, hgb 9.6, plt 184 Trop <0.03 ____________________________________________   EKG  I, Nance Pear, attending  physician, personally viewed and interpreted this EKG  EKG Time: 1918 Rate: 91 Rhythm: sinus rhythm with PVC Axis: normal Intervals: qtc 435 QRS: narrow ST changes: no st elevation Impression: abnormal ekg   ____________________________________________    RADIOLOGY  CXR No acute disease ____________________________________________   PROCEDURES  Procedures  ____________________________________________   INITIAL IMPRESSION /  ASSESSMENT AND PLAN / ED COURSE  Pertinent labs & imaging results that were available during my care of the patient were reviewed by me and considered in my medical decision making (see chart for details).   Patient presented to the emergency department today because of concerns for shortness of breath.  Patient does have a history of COPD and on exam patient was found to be tachycardic with significant and diffuse expiratory wheezing.  Patient's oxygen did drop to 88% while ambulating on oxygen.  Patient was given steroids and magnesium and stated she felt improved however still appeared somewhat short of breath.  I did recommend admission for further work-up and management of her shortness of breath however patient stated she wanted to go home.  We did discuss risks of going home including continued hypoxia and organ damage.  Did discuss strict return precautions for any worsening of conditions.  Will give patient prescription for steroids for the next couple of days.  ____________________________________________   FINAL CLINICAL IMPRESSION(S) / ED DIAGNOSES  Final diagnoses:  COPD exacerbation ([place])     Note: This dictation was prepared with Dragon dictation. Any transcriptional errors that result from this process are unintentional     Nance Pear, MD 06/08/18 2358

## 2018-06-08 NOTE — ED Notes (Signed)
X-ray at bedside

## 2018-06-08 NOTE — ED Notes (Signed)
MD Archie Balboa at bedside to discuss pt wishes. Pt still states that she wants to go home at this time.

## 2018-06-11 DIAGNOSIS — J449 Chronic obstructive pulmonary disease, unspecified: Secondary | ICD-10-CM | POA: Diagnosis not present

## 2018-06-12 DIAGNOSIS — H2513 Age-related nuclear cataract, bilateral: Secondary | ICD-10-CM | POA: Diagnosis not present

## 2018-06-12 DIAGNOSIS — E119 Type 2 diabetes mellitus without complications: Secondary | ICD-10-CM | POA: Diagnosis not present

## 2018-06-26 ENCOUNTER — Telehealth: Payer: Self-pay | Admitting: *Deleted

## 2018-06-26 NOTE — Telephone Encounter (Signed)
Left message for patient to notify them that it is time to schedule annual low dose lung cancer screening CT scan. Instructed patient to call back to verify information prior to the scan being scheduled.  

## 2018-06-27 ENCOUNTER — Other Ambulatory Visit: Payer: Self-pay | Admitting: Family Medicine

## 2018-06-27 DIAGNOSIS — J449 Chronic obstructive pulmonary disease, unspecified: Secondary | ICD-10-CM | POA: Diagnosis not present

## 2018-06-29 ENCOUNTER — Telehealth: Payer: Self-pay | Admitting: *Deleted

## 2018-06-29 ENCOUNTER — Telehealth: Payer: Self-pay

## 2018-06-29 DIAGNOSIS — Z122 Encounter for screening for malignant neoplasm of respiratory organs: Secondary | ICD-10-CM

## 2018-06-29 DIAGNOSIS — Z87891 Personal history of nicotine dependence: Secondary | ICD-10-CM

## 2018-06-29 NOTE — Telephone Encounter (Signed)
Call pt regarding lung screening. Pt is a former smoker. Pt would like midmorning for scan and pt denies any new health issues at this time.

## 2018-06-29 NOTE — Telephone Encounter (Signed)
Patient has been notified that annual lung cancer screening low dose CT scan is due currently or will be in near future. Confirmed that patient is within the age range of 55-77, and asymptomatic, (no signs or symptoms of lung cancer). Patient denies illness that would prevent curative treatment for lung cancer if found. Verified smoking history, (former, quit 01/11/07, 72 pack year). The shared decision making visit was done 06/01/17. Patient is agreeable for CT scan being scheduled.

## 2018-07-10 ENCOUNTER — Ambulatory Visit
Admission: RE | Admit: 2018-07-10 | Discharge: 2018-07-10 | Disposition: A | Discharge status: Home / Self Care | Payer: PPO | Source: Ambulatory Visit | Attending: Nurse Practitioner | Admitting: Nurse Practitioner

## 2018-07-10 ENCOUNTER — Other Ambulatory Visit: Payer: Self-pay

## 2018-07-10 DIAGNOSIS — Z122 Encounter for screening for malignant neoplasm of respiratory organs: Secondary | ICD-10-CM | POA: Diagnosis not present

## 2018-07-10 DIAGNOSIS — Z87891 Personal history of nicotine dependence: Secondary | ICD-10-CM | POA: Diagnosis not present

## 2018-07-12 ENCOUNTER — Telehealth: Payer: Self-pay | Admitting: *Deleted

## 2018-07-12 ENCOUNTER — Encounter: Payer: Self-pay | Admitting: Family Medicine

## 2018-07-12 DIAGNOSIS — I359 Nonrheumatic aortic valve disorder, unspecified: Secondary | ICD-10-CM | POA: Insufficient documentation

## 2018-07-12 DIAGNOSIS — I2510 Atherosclerotic heart disease of native coronary artery without angina pectoris: Secondary | ICD-10-CM | POA: Insufficient documentation

## 2018-07-12 DIAGNOSIS — I700 Atherosclerosis of aorta: Secondary | ICD-10-CM | POA: Insufficient documentation

## 2018-07-12 NOTE — Telephone Encounter (Signed)
After discussion with Dr. Lanney Gins and thoracic nurse navigator, notified patient of LDCT lung cancer screening program results with recommendation for further workup at this time. Also notified of incidental findings noted below and is encouraged to discuss further with PCP who will receive a copy of this note and/or the CT report. Patient verbalizes understanding.  The plan will be to review with Dr. Genevive Bi when he is available on 07/16/18 and to review in weekly case conference next week. Patient is in agreement with this plan.   IMPRESSION: 1. Lung-RADS 4B, suspicious. Additional imaging evaluation or consultation with Pulmonology or Thoracic Surgery recommended. 2. The "S" modifier above refers to potentially clinically significant non lung cancer related findings. Specifically, there is aortic atherosclerosis, in addition to left main and 3 vessel coronary artery disease. Assessment for potential risk factor modification, dietary therapy or pharmacologic therapy may be warranted, if clinically indicated. 3. Mild diffuse bronchial wall thickening with mild centrilobular and paraseptal emphysema. 4. There are severe calcifications of the aortic valve and mild calcifications of the mitral annulus. Echocardiographic correlation for evaluation of potential valvular dysfunction may be warranted if clinically indicated.  These results will be called to the ordering clinician or representative by the Radiologist Assistant, and communication documented in the PACS or zVision Dashboard.  Aortic Atherosclerosis (ICD10-I70.0) and Emphysema (ICD10-J43.9).

## 2018-07-17 ENCOUNTER — Telehealth: Payer: Self-pay | Admitting: *Deleted

## 2018-07-17 ENCOUNTER — Other Ambulatory Visit: Payer: Self-pay | Admitting: *Deleted

## 2018-07-17 DIAGNOSIS — R911 Solitary pulmonary nodule: Secondary | ICD-10-CM

## 2018-07-17 DIAGNOSIS — IMO0001 Reserved for inherently not codable concepts without codable children: Secondary | ICD-10-CM

## 2018-07-17 NOTE — Telephone Encounter (Signed)
After review of CT scan with thoracic surgery, patient is scheduled for PET scan on 07/25/18 and to see Dr. Genevive Bi on 07/27/18. Patient is in agreement with this plan.

## 2018-07-19 ENCOUNTER — Other Ambulatory Visit: Payer: PPO

## 2018-07-19 NOTE — Progress Notes (Signed)
Tumor Board Documentation  Samantha Henry was presented by Army Chaco, RN at our Tumor Board on 07/19/2018, which included representatives from medical oncology, radiation oncology, pathology, radiology, surgical, navigation, internal medicine, palliative care, research.  Samantha Henry currently presents as a current patient, for discussion with history of the following treatments: active survellience.  Additionally, we reviewed previous medical and familial history, history of present illness, and recent lab results along with all available histopathologic and imaging studies. The tumor board considered available treatment options and made the following recommendations: Additional screening, Active surveillance refer to Dr Genevive Bi for surgical evaluation  The following procedures/referrals were also placed: No orders of the defined types were placed in this encounter.   Clinical Trial Status: not discussed   Staging used:    National site-specific guidelines   were discussed with respect to the case.  Tumor board is a meeting of clinicians from various specialty areas who evaluate and discuss patients for whom a multidisciplinary approach is being considered. Final determinations in the plan of care are those of the provider(s). The responsibility for follow up of recommendations given during tumor board is that of the provider.   Today's extended care, comprehensive team conference, Samantha Henry was not present for the discussion and was not examined.   Multidisciplinary Tumor Board is a multidisciplinary case peer review process.  Decisions discussed in the Multidisciplinary Tumor Board reflect the opinions of the specialists present at the conference without having examined the patient.  Ultimately, treatment and diagnostic decisions rest with the primary provider(s) and the patient.

## 2018-07-25 ENCOUNTER — Encounter
Admission: RE | Admit: 2018-07-25 | Discharge: 2018-07-25 | Disposition: A | Discharge status: Home / Self Care | Payer: PPO | Source: Ambulatory Visit | Attending: Nurse Practitioner | Admitting: Nurse Practitioner

## 2018-07-25 ENCOUNTER — Other Ambulatory Visit: Payer: Self-pay

## 2018-07-25 DIAGNOSIS — R911 Solitary pulmonary nodule: Secondary | ICD-10-CM | POA: Diagnosis not present

## 2018-07-25 DIAGNOSIS — I2510 Atherosclerotic heart disease of native coronary artery without angina pectoris: Secondary | ICD-10-CM | POA: Diagnosis not present

## 2018-07-25 DIAGNOSIS — I700 Atherosclerosis of aorta: Secondary | ICD-10-CM | POA: Diagnosis not present

## 2018-07-25 DIAGNOSIS — IMO0001 Reserved for inherently not codable concepts without codable children: Secondary | ICD-10-CM

## 2018-07-25 LAB — GLUCOSE, CAPILLARY: Glucose-Capillary: 99 mg/dL (ref 70–99)

## 2018-07-25 MED ORDER — FLUDEOXYGLUCOSE F - 18 (FDG) INJECTION
10.9700 | Freq: Once | INTRAVENOUS | Status: AC | PRN
  Administered 2018-07-25: 10.97 via INTRAVENOUS

## 2018-07-27 ENCOUNTER — Ambulatory Visit (INDEPENDENT_AMBULATORY_CARE_PROVIDER_SITE_OTHER): Payer: PPO | Admitting: Cardiothoracic Surgery

## 2018-07-27 ENCOUNTER — Telehealth: Payer: Self-pay

## 2018-07-27 ENCOUNTER — Other Ambulatory Visit: Payer: Self-pay

## 2018-07-27 ENCOUNTER — Encounter: Payer: Self-pay | Admitting: Cardiothoracic Surgery

## 2018-07-27 VITALS — BP 135/79 | HR 78 | Temp 97.3°F | Ht 67.0 in | Wt 207.0 lb

## 2018-07-27 DIAGNOSIS — R918 Other nonspecific abnormal finding of lung field: Secondary | ICD-10-CM | POA: Diagnosis not present

## 2018-07-27 DIAGNOSIS — J449 Chronic obstructive pulmonary disease, unspecified: Secondary | ICD-10-CM | POA: Diagnosis not present

## 2018-07-27 NOTE — Patient Instructions (Addendum)
We will schedule the Pulmonary Function test and call you later with the appointment information.   We sent the referral to Radiation Oncology Dr.Crystal. Someone from their office will call you to schedule an appointment within 5-7 days. If you have not heard from anyone within the time frame listed above call our office to let us know.    Please see your follow up appointment listed below.

## 2018-07-27 NOTE — Telephone Encounter (Signed)
Spoke with patient - Dr.Crystal-08/06/2018 covid 08/23/18 [facility]-12:30-2:30 PFT's- 08-27-2018@ 4:00 pm-unable to get earlier appt-[facility] not available until September. Dr.Oaks-08-31-2018   Patient verbalized understanding of all appointments listed above.

## 2018-07-27 NOTE — Progress Notes (Signed)
Patient ID: Samantha Henry, female   DOB: September 01, 1945, 73 y.o.   MRN: [id number]  Chief Complaint  Patient presents with  . New Patient (Initial Visit)    Lung Nodule    Referred By Dr. Lelon Huh Reason for Referral left upper lobe mass  HPI Location, Quality, Duration, Severity, Timing, Context, Modifying Factors, Associated Signs and Symptoms.  Samantha Henry is a 73 y.o. female.  She has a longstanding history of COPD and oxygen dependent underlying lung disease.  She states that she had 2 visits to the emergency department for increasing shortness of breath and had been treated with inhalers and oral steroids.  This did help and she had another CT scan done as part of the work-up for that her shortness of breath.  She had had a prior scan about a year ago and this demonstrated a left upper lobe cystic lesion measuring about 1 to 2 cm in size.  The repeat CT scan showed a slight increase in the nodularity associated with the cystic lesion and a subsequent PET scan was ordered.  This did show some hypermetabolism within the nodule and this was concerning for malignancy.  The patient states that she gets short of breath with minimal activities.  She is on chronic oxygen therapy and has been so for the last several months.  She has extensive wheezing.  She denied any hemoptysis.  She is currently retired.  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  There is no family history of lung cancer.   Past Medical History:  Diagnosis Date  . Arthritis    osteo  . Cancer ([place]) 1969   CERVICAL CA  . COPD (chronic obstructive pulmonary disease) ([place])   . Diabetes mellitus without complication ([place])   . Diabetic peripheral neuropathy ([place])   . Environmental and seasonal allergies   . GERD (gastroesophageal reflux disease)   . History of cervical cancer   . Hypercholesteremia   . Hypertension   . Hypothyroidism   . Neuropathy   . Sleep apnea   . TIA (transient  ischemic attack)   . Von Willebrand's disease [facility])     Past Surgical History:  Procedure Laterality Date  . ABDOMINAL HYSTERECTOMY  1969   due to cervical cancer   . APPENDECTOMY    . BLADDER SURGERY  1997   bladder tacking  . BREAST BIOPSY Left 01/21/2015   FIBROADENOMATOUS CHANGE WITH ASSOCIATED MICROCALCIFICATIONS  . CARDIAC CATHETERIZATION    . CARPAL TUNNEL RELEASE  2010  . colonoscopy and upper endoscopy'    . COLONOSCOPY WITH PROPOFOL N/A 02/25/2016   Procedure: COLONOSCOPY WITH PROPOFOL;  Surgeon: Lollie Sails, MD;  Location: [facility] ENDOSCOPY;  Service: Endoscopy;  Laterality: N/A;  . OOPHORECTOMY    . TENDON RELEASE  2010   right foot, Dr. Vickki Muff   . TOTAL KNEE ARTHROPLASTY Left 06/14/2016   Procedure: TOTAL KNEE ARTHROPLASTY;  Surgeon: Corky Mull, MD;  Location: [facility] ORS;  Service: Orthopedics;  Laterality: Left;    Family History  Problem Relation Age of Onset  . Alzheimer's disease Mother   . Cancer Father   . Diabetes Sister   . Colon cancer Sister   . Diabetes Brother   . Cancer Sister 60       melanoma  . Breast cancer Maternal Aunt   . Hodgkin's lymphoma Sister     Social History Social History   Tobacco Use  . Smoking status: Former  Smoker    Packs/day: 1.50    Years: 48.00    Pack years: 72.00    Types: Cigarettes    Quit date: 01/11/2007    Years since quitting: 11.5  . Smokeless tobacco: Never Used  . Tobacco comment: smoked up to 2 ppd since 73 yo. quit in 2009  Substance Use Topics  . Alcohol use: No    Alcohol/week: 0.0 standard drinks  . Drug use: No    Allergies  Allergen Reactions  . Aspirin     AVOIDs due to history of Von Willebrands  . Gabapentin     dizzy  . Lyrica [Pregabalin] Other (See Comments)    Dizziness  . Tetracycline Rash    Current Outpatient Medications  Medication Sig Dispense Refill  . acetaminophen (TYLENOL) 650 MG CR tablet Take 650 mg by mouth every 8 (eight) hours as needed for pain.    Marland Kitchen albuterol  (PROVENTIL HFA;VENTOLIN HFA) 108 (90 Base) MCG/ACT inhaler Inhale 2 puffs into the lungs every 6 (six) hours as needed for wheezing or shortness of breath. 1 Inhaler 3  . aspirin 81 MG tablet Take 1 tablet by mouth daily.    . Blood Glucose Monitoring Suppl (ONE TOUCH ULTRA 2) w/Device KIT Use daily to check blood sugar for type 2 diabetes E11.9 1 each 0  . budesonide-formoterol (SYMBICORT) 160-4.5 MCG/ACT inhaler Inhale 2 puffs into the lungs 2 (two) times daily. 1 Inhaler 12  . calcium carbonate (CALCIUM 600) 600 MG TABS tablet Take 500 mg by mouth 2 (two) times daily with a meal.     . calcium carbonate (TUMS EX) 750 MG chewable tablet Chew 2 tablets by mouth 2 (two) times daily as needed for heartburn.    . cholecalciferol (VITAMIN D) 400 units TABS tablet Take 400 Units by mouth daily.    Marland Kitchen glucose blood (ONE TOUCH ULTRA TEST) test strip Use as instructed to check blood sugar daily for type 2 diabetes E11.9 100 each 4  . levothyroxine (SYNTHROID, LEVOTHROID) 125 MCG tablet TAKE 1 TABLET BY MOUTH ONCE DAILY BEFORE BREAKFAST 90 tablet 4  . losartan (COZAAR) 50 MG tablet Take 1 tablet by mouth once daily 90 tablet 04  . metFORMIN (GLUCOPHAGE-XR) 500 MG 24 hr tablet TAKE 2 TABLETS BY MOUTH IN THE MORNING, THEN 2 TABLETS BY MOUTH IN THE EVENING 360 tablet 4  . MULTIPLE VITAMIN PO Take 1 tablet by mouth daily.    . Omega-3 Fatty Acids (FISH OIL BURP-LESS) 1000 MG CAPS Take 1 tablet by mouth 2 (two) times daily.     Marland Kitchen oxymetazoline (AFRIN) 0.05 % nasal spray Place 2 sprays into both nostrils at bedtime.    . pseudoephedrine-dextromethorphan-guaifenesin (ROBITUSSIN-PE) 30-10-100 MG/5ML solution Take 10 mLs by mouth 4 (four) times daily as needed for cough.    . RABEprazole (ACIPHEX) 20 MG tablet Take 1 tablet by mouth once daily 30 tablet 12  . rosuvastatin (CRESTOR) 20 MG tablet TAKE 1 TABLET BY MOUTH ONCE DAILY 90 tablet 3  . Tiotropium Bromide Monohydrate (SPIRIVA RESPIMAT) 1.25 MCG/ACT AERS Inhale  1 spray into the lungs every morning.    . trolamine salicylate (ASPERCREME) 10 % cream Apply 1 application topically as needed for muscle pain.    . vitamin B-12 (CYANOCOBALAMIN) 50 MCG tablet Take 50 mcg by mouth daily.     No current facility-administered medications for this visit.       Review of Systems A complete review of systems was asked and was negative  except for the following positive findings cough, diabetes, cholesterol, bruising, sinus problems.  Blood pressure 135/79, pulse 78, temperature (!) 97.3 F (36.3 C), temperature source Temporal, height 5' 7" (1.702 m), weight 207 lb (93.9 kg), SpO2 98 %.  Physical Exam CONSTITUTIONAL:  Pleasant, well-developed, well-nourished, and in no acute distress. EYES: Pupils equal and reactive to light, Sclera non-icteric EARS, NOSE, MOUTH AND THROAT:  The oropharynx was clear.  Dentition is good repair.  Oral mucosa pink and moist. LYMPH NODES:  Lymph nodes in the neck and axillae were normal RESPIRATORY:  Lung exam revealed inspiratory and expiratory wheezes throughout..  Normal respiratory effort without pathologic use of accessory muscles of respiration CARDIOVASCULAR: Heart was regular with systolic ejection murmurs.  There were no carotid bruits. GI: The abdomen was soft, nontender, and nondistended. There were no palpable masses. There was no hepatosplenomegaly. There were normal bowel sounds in all quadrants. GU:  Rectal deferred.   MUSCULOSKELETAL:  Normal muscle strength and tone.  No clubbing or cyanosis.   SKIN:  There were no pathologic skin lesions.  There were no nodules on palpation. NEUROLOGIC:  Sensation is normal.  Cranial nerves are grossly intact. PSYCH:  Oriented to person, place and time.  Mood and affect are normal.  Data Reviewed CT scans and PET scans  I have personally reviewed the patient's imaging, laboratory findings and medical records.    Assessment    Left upper lobe cystic lesion with nodular  component most concerning for malignancy    Plan    I would like to obtain some pulmonary function studies and also make a referral to Dr. Berton Mount for his consultation.  The patient is obviously at higher risk for surgery because of her oxygen dependence.  She understands this.  When she sees Dr. Donella Stade we can then discuss whether not a biopsy is necessary.  All of her questions were answered.     Nestor Lewandowsky, MD 07/27/2018, 10:00 AM

## 2018-07-27 NOTE — Telephone Encounter (Signed)
Patient called and stated that she has to use her inhaler everyday and was worried that it would affect her PFT test, I talked with a nurse and we advised the patient that if she absolutely has to use it then that's okay but try not to use it 4 hours prior to her test.

## 2018-07-30 DIAGNOSIS — J449 Chronic obstructive pulmonary disease, unspecified: Secondary | ICD-10-CM | POA: Diagnosis not present

## 2018-07-30 DIAGNOSIS — R05 Cough: Secondary | ICD-10-CM | POA: Diagnosis not present

## 2018-07-30 DIAGNOSIS — R911 Solitary pulmonary nodule: Secondary | ICD-10-CM | POA: Diagnosis not present

## 2018-07-30 DIAGNOSIS — R0600 Dyspnea, unspecified: Secondary | ICD-10-CM | POA: Diagnosis not present

## 2018-07-31 ENCOUNTER — Telehealth: Payer: Self-pay

## 2018-07-31 NOTE — Telephone Encounter (Signed)
Per Pamala Hurry - PFT appointment 08/08/2018 @ 8 am- [facility]-08/02/2018 12:30-2:30 [facility] 08/17/2018 @ 9:15 am.  Left message on VM with above information.

## 2018-08-02 ENCOUNTER — Other Ambulatory Visit
Admission: RE | Admit: 2018-08-02 | Discharge: 2018-08-02 | Disposition: A | Discharge status: Home / Self Care | Payer: PPO | Source: Ambulatory Visit | Attending: Cardiothoracic Surgery | Admitting: Cardiothoracic Surgery

## 2018-08-02 ENCOUNTER — Other Ambulatory Visit: Payer: Self-pay

## 2018-08-02 DIAGNOSIS — Z1159 Encounter for screening for other viral diseases: Secondary | ICD-10-CM | POA: Diagnosis not present

## 2018-08-02 LAB — SARS CORONAVIRUS 2 (TAT 6-24 HRS): SARS Coronavirus 2: NEGATIVE

## 2018-08-06 ENCOUNTER — Ambulatory Visit: Payer: PPO | Admitting: Radiation Oncology

## 2018-08-08 ENCOUNTER — Other Ambulatory Visit: Payer: Self-pay

## 2018-08-08 ENCOUNTER — Ambulatory Visit: Payer: PPO | Attending: Cardiothoracic Surgery

## 2018-08-08 DIAGNOSIS — R918 Other nonspecific abnormal finding of lung field: Secondary | ICD-10-CM | POA: Diagnosis not present

## 2018-08-08 DIAGNOSIS — J449 Chronic obstructive pulmonary disease, unspecified: Secondary | ICD-10-CM | POA: Insufficient documentation

## 2018-08-08 LAB — BLOOD GAS, ARTERIAL
Acid-Base Excess: 8.3 mmol/L — ABNORMAL HIGH (ref 0.0–2.0)
Bicarbonate: 34 mmol/L — ABNORMAL HIGH (ref 20.0–28.0)
FIO2: 0.21
O2 Saturation: 93.2 %
Patient temperature: 37
pCO2 arterial: 50 mmHg — ABNORMAL HIGH (ref 32.0–48.0)
pH, Arterial: 7.44 (ref 7.350–7.450)
pO2, Arterial: 65 mmHg — ABNORMAL LOW (ref 83.0–108.0)

## 2018-08-08 MED ORDER — ALBUTEROL SULFATE (2.5 MG/3ML) 0.083% IN NEBU
2.5000 mg | INHALATION_SOLUTION | Freq: Once | RESPIRATORY_TRACT | Status: AC
  Administered 2018-08-08: 2.5 mg via RESPIRATORY_TRACT
  Filled 2018-08-08: qty 3

## 2018-08-10 ENCOUNTER — Other Ambulatory Visit: Payer: Self-pay

## 2018-08-13 ENCOUNTER — Other Ambulatory Visit: Payer: Self-pay

## 2018-08-13 ENCOUNTER — Ambulatory Visit
Admission: RE | Admit: 2018-08-13 | Discharge: 2018-08-13 | Disposition: A | Discharge status: Home / Self Care | Payer: PPO | Source: Ambulatory Visit | Attending: Radiation Oncology | Admitting: Radiation Oncology

## 2018-08-13 ENCOUNTER — Encounter: Payer: Self-pay | Admitting: Radiation Oncology

## 2018-08-13 VITALS — BP 145/67 | HR 77 | Temp 97.8°F | Resp 18

## 2018-08-13 DIAGNOSIS — Z87891 Personal history of nicotine dependence: Secondary | ICD-10-CM | POA: Diagnosis not present

## 2018-08-13 DIAGNOSIS — R911 Solitary pulmonary nodule: Secondary | ICD-10-CM

## 2018-08-13 DIAGNOSIS — R918 Other nonspecific abnormal finding of lung field: Secondary | ICD-10-CM | POA: Diagnosis not present

## 2018-08-13 NOTE — Consult Note (Signed)
NEW PATIENT EVALUATION  Name: Samantha Henry  MRN: [id number]  Date:   08/13/2018     DOB: 1945/05/25   This 73 y.o. female patient presents to the clinic for initial evaluation of probable stage I non-small cell lung cancer of the left upper lobe.  REFERRING PHYSICIAN: Nestor Lewandowsky, MD  CHIEF COMPLAINT:  Chief Complaint  Patient presents with  . Lung Lesion    Initial Eval    DIAGNOSIS: The encounter diagnosis was Lung nodule.   PREVIOUS INVESTIGATIONS:  PET/CT and CT scans reviewed Clinical notes reviewed Case presented at weekly tumor conference  HPI: Patient is a 73 year old female with significant oxygen dependent COPD.  She was seen in the emergency room for increasing shortness of breath and had a CT scan of part of her work-up.  This was compared to prior films which had showed a left upper lobe cystic lesion measuring approximate 1 to 2 cm.  Repeat CT scan at this time in the emergency room showed slight increase in nodularity associate with a lesion and PET scan was ordered.  This showed hypermetabolic activity in the nodule concerning for malignancy.  Patient has extensive shortness of breath and dyspnea on exertion with minimal activity.  She is on chronic oxygen therapy.  She had pulmonary function tests showing an FEV1 of 22% of predicted FVC of 38% predicted.  She specifically denies productive cough hemoptysis.  Her p.o. intake is good she is her weight is stable.  She is now referred to radiation oncology for consideration of SBRT.  PLANNED TREATMENT REGIMEN: SBRT to left upper lobe  PAST MEDICAL HISTORY:  has a past medical history of Arthritis, Cancer ([place]) (1969), COPD (chronic obstructive pulmonary disease) ([place]), Diabetes mellitus without complication ([place]), Diabetic peripheral neuropathy ([place]), Environmental and seasonal allergies, GERD (gastroesophageal reflux disease), History of cervical cancer, Hypercholesteremia, Hypertension, Hypothyroidism, Neuropathy,  Sleep apnea, TIA (transient ischemic attack), and Von Willebrand's disease ([place]).    PAST SURGICAL HISTORY:  Past Surgical History:  Procedure Laterality Date  . ABDOMINAL HYSTERECTOMY  1969   due to cervical cancer   . APPENDECTOMY    . BLADDER SURGERY  1997   bladder tacking  . BREAST BIOPSY Left 01/21/2015   FIBROADENOMATOUS CHANGE WITH ASSOCIATED MICROCALCIFICATIONS  . CARDIAC CATHETERIZATION    . CARPAL TUNNEL RELEASE  2010  . colonoscopy and upper endoscopy'    . COLONOSCOPY WITH PROPOFOL N/A 02/25/2016   Procedure: COLONOSCOPY WITH PROPOFOL;  Surgeon: Lollie Sails, MD;  Location: [facility] ENDOSCOPY;  Service: Endoscopy;  Laterality: N/A;  . OOPHORECTOMY    . TENDON RELEASE  2010   right foot, Dr. Vickki Muff   . TOTAL KNEE ARTHROPLASTY Left 06/14/2016   Procedure: TOTAL KNEE ARTHROPLASTY;  Surgeon: Corky Mull, MD;  Location: [facility] ORS;  Service: Orthopedics;  Laterality: Left;    FAMILY HISTORY: family history includes Alzheimer's disease in her mother; Breast cancer in her maternal aunt; Cancer in her father; Cancer (age of onset: 73) in her sister; Colon cancer in her sister; Diabetes in her brother and sister; Hodgkin's lymphoma in her sister.  SOCIAL HISTORY:  reports that she quit smoking about 11 years ago. Her smoking use included cigarettes. She has a 72.00 pack-year smoking history. She has never used smokeless tobacco. She reports that she does not drink alcohol or use drugs.  ALLERGIES: Aspirin, Gabapentin, Lyrica [pregabalin], and Tetracycline  MEDICATIONS:  Current Outpatient Medications  Medication Sig Dispense Refill  . acetaminophen (TYLENOL) 650 MG CR tablet  Take 650 mg by mouth every 8 (eight) hours as needed for pain.    Marland Kitchen albuterol (PROVENTIL HFA;VENTOLIN HFA) 108 (90 Base) MCG/ACT inhaler Inhale 2 puffs into the lungs every 6 (six) hours as needed for wheezing or shortness of breath. 1 Inhaler 3  . aspirin 81 MG tablet Take 1 tablet by mouth daily.    . Blood  Glucose Monitoring Suppl (ONE TOUCH ULTRA 2) w/Device KIT Use daily to check blood sugar for type 2 diabetes E11.9 1 each 0  . budesonide-formoterol (SYMBICORT) 160-4.5 MCG/ACT inhaler Inhale 2 puffs into the lungs 2 (two) times daily. 1 Inhaler 12  . calcium carbonate (CALCIUM 600) 600 MG TABS tablet Take 500 mg by mouth 2 (two) times daily with a meal.     . calcium carbonate (TUMS EX) 750 MG chewable tablet Chew 2 tablets by mouth 2 (two) times daily as needed for heartburn.    . cholecalciferol (VITAMIN D) 400 units TABS tablet Take 400 Units by mouth daily.    Marland Kitchen glucose blood (ONE TOUCH ULTRA TEST) test strip Use as instructed to check blood sugar daily for type 2 diabetes E11.9 100 each 4  . levothyroxine (SYNTHROID, LEVOTHROID) 125 MCG tablet TAKE 1 TABLET BY MOUTH ONCE DAILY BEFORE BREAKFAST 90 tablet 4  . losartan (COZAAR) 50 MG tablet Take 1 tablet by mouth once daily 90 tablet 04  . metFORMIN (GLUCOPHAGE-XR) 500 MG 24 hr tablet TAKE 2 TABLETS BY MOUTH IN THE MORNING, THEN 2 TABLETS BY MOUTH IN THE EVENING 360 tablet 4  . MULTIPLE VITAMIN PO Take 1 tablet by mouth daily.    . Omega-3 Fatty Acids (FISH OIL BURP-LESS) 1000 MG CAPS Take 1 tablet by mouth 2 (two) times daily.     Marland Kitchen oxymetazoline (AFRIN) 0.05 % nasal spray Place 2 sprays into both nostrils at bedtime.    . pseudoephedrine-dextromethorphan-guaifenesin (ROBITUSSIN-PE) 30-10-100 MG/5ML solution Take 10 mLs by mouth 4 (four) times daily as needed for cough.    . RABEprazole (ACIPHEX) 20 MG tablet Take 1 tablet by mouth once daily 30 tablet 12  . rosuvastatin (CRESTOR) 20 MG tablet TAKE 1 TABLET BY MOUTH ONCE DAILY 90 tablet 3  . Tiotropium Bromide Monohydrate (SPIRIVA RESPIMAT) 1.25 MCG/ACT AERS Inhale 1 spray into the lungs every morning.    . trolamine salicylate (ASPERCREME) 10 % cream Apply 1 application topically as needed for muscle pain.    . vitamin B-12 (CYANOCOBALAMIN) 50 MCG tablet Take 50 mcg by mouth daily.     No  current facility-administered medications for this encounter.     ECOG PERFORMANCE STATUS:  0 - Asymptomatic  REVIEW OF SYSTEMS: Except for the oxygen patency based on her chronic COPD emphysema. Patient denies any weight loss, fatigue, weakness, fever, chills or night sweats. Patient denies any loss of vision, blurred vision. Patient denies any ringing  of the ears or hearing loss. No irregular heartbeat. Patient denies heart murmur or history of fainting. Patient denies any chest pain or pain radiating to her upper extremities. Patient denies any shortness of breath, difficulty breathing at night, cough or hemoptysis. Patient denies any swelling in the lower legs. Patient denies any nausea vomiting, vomiting of blood, or coffee ground material in the vomitus. Patient denies any stomach pain. Patient states has had normal bowel movements no significant constipation or diarrhea. Patient denies any dysuria, hematuria or significant nocturia. Patient denies any problems walking, swelling in the joints or loss of balance. Patient denies any skin changes,  loss of hair or loss of weight. Patient denies any excessive worrying or anxiety or significant depression. Patient denies any problems with insomnia. Patient denies excessive thirst, polyuria, polydipsia. Patient denies any swollen glands, patient denies easy bruising or easy bleeding. Patient denies any recent infections, allergies or URI. Patient "s visual fields have not changed significantly in recent time.   PHYSICAL EXAM: BP (!) 145/67   Pulse 77   Temp 97.8 F (36.6 C)   Resp 18  Patient is on continuous nasal oxygen.  Wheelchair-bound.  Well-developed well-nourished patient in NAD. HEENT reveals PERLA, EOMI, discs not visualized.  Oral cavity is clear. No oral mucosal lesions are identified. Neck is clear without evidence of cervical or supraclavicular adenopathy. Lungs are clear to A&P. Cardiac examination is essentially unremarkable with  regular rate and rhythm without murmur rub or thrill. Abdomen is benign with no organomegaly or masses noted. Motor sensory and DTR levels are equal and symmetric in the upper and lower extremities. Cranial nerves II through XII are grossly intact. Proprioception is intact. No peripheral adenopathy or edema is identified. No motor or sensory levels are noted. Crude visual fields are within normal range.  LABORATORY DATA: Labs are reviewed pulmonary function test reviewed    RADIOLOGY RESULTS: CT scan and PET CT scan reviewed   IMPRESSION: Probable stage I non-small cell lung cancer left upper lobe in a 73 year old female with significant COPD and oxygen dependent lung disease.  PLAN: At this time I have recommended SBRT to her left upper lobe cystic lesion.  I would plan on delivering 6000 cGy in 5 fractions.  Risks and benefits of treatment including possible fatigue scarring of her lung and development of a chronic cough after her treatments are complete.  All were described in detail to the patient.  I would use for dimensional treatment planning as well as motion restriction during simulation.  I have personally set up and ordered CT simulation for later this week.  Patient comprehends my treatment plan well.  I would like to take this opportunity to thank you for allowing me to participate in the care of your patient.Noreene Filbert, MD

## 2018-08-14 ENCOUNTER — Ambulatory Visit
Admission: RE | Admit: 2018-08-14 | Discharge: 2018-08-14 | Disposition: A | Discharge status: Home / Self Care | Payer: PPO | Source: Ambulatory Visit | Attending: Radiation Oncology | Admitting: Radiation Oncology

## 2018-08-14 ENCOUNTER — Encounter: Payer: Self-pay | Admitting: *Deleted

## 2018-08-14 ENCOUNTER — Other Ambulatory Visit: Payer: Self-pay

## 2018-08-14 DIAGNOSIS — Z510 Encounter for antineoplastic radiation therapy: Secondary | ICD-10-CM | POA: Diagnosis not present

## 2018-08-14 DIAGNOSIS — R911 Solitary pulmonary nodule: Secondary | ICD-10-CM | POA: Diagnosis not present

## 2018-08-14 DIAGNOSIS — Z87891 Personal history of nicotine dependence: Secondary | ICD-10-CM | POA: Diagnosis not present

## 2018-08-14 DIAGNOSIS — R918 Other nonspecific abnormal finding of lung field: Secondary | ICD-10-CM | POA: Diagnosis not present

## 2018-08-14 NOTE — Progress Notes (Signed)
  Oncology Nurse Navigator Documentation  Navigator Location: [facility]c (08/14/18 1400) Referral Date to RadOnc/MedOnc: 07/27/18 (08/14/18 1400) )Navigator Encounter Type: Lobby (08/14/18 1400)   Abnormal Finding Date: 07/10/18 (08/14/18 1400)                 Patient Visit Type: RadOnc (08/14/18 1400) Treatment Phase: CT SIM (08/14/18 1400) Barriers/Navigation Needs: Coordination of Care (08/14/18 1400)   Interventions: Coordination of Care (08/14/18 1400)   Coordination of Care: Appts (08/14/18 1400)        Acuity: Level 1 (08/14/18 1400) Acuity Level 1: Initial guidance, education and coordination as needed;Minimal follow up required (08/14/18 1400)      met with patient after completing CT simulation. All questions answered during visit. Reviewed upcoming appts with pt and her daughter, [name]. Instructed to call with any further questions or needs. Pt and pt's daughter verbalized understanding.  Time Spent with Patient: 45 (08/14/18 1400)

## 2018-08-15 DIAGNOSIS — Z510 Encounter for antineoplastic radiation therapy: Secondary | ICD-10-CM | POA: Diagnosis not present

## 2018-08-15 DIAGNOSIS — Z87891 Personal history of nicotine dependence: Secondary | ICD-10-CM | POA: Diagnosis not present

## 2018-08-15 DIAGNOSIS — R918 Other nonspecific abnormal finding of lung field: Secondary | ICD-10-CM | POA: Diagnosis not present

## 2018-08-17 ENCOUNTER — Encounter: Payer: Self-pay | Admitting: Cardiothoracic Surgery

## 2018-08-17 ENCOUNTER — Other Ambulatory Visit: Payer: Self-pay

## 2018-08-17 ENCOUNTER — Ambulatory Visit: Payer: Self-pay | Admitting: Cardiothoracic Surgery

## 2018-08-17 ENCOUNTER — Ambulatory Visit (INDEPENDENT_AMBULATORY_CARE_PROVIDER_SITE_OTHER): Payer: PPO | Admitting: Cardiothoracic Surgery

## 2018-08-17 DIAGNOSIS — R918 Other nonspecific abnormal finding of lung field: Secondary | ICD-10-CM | POA: Diagnosis not present

## 2018-08-17 NOTE — Patient Instructions (Addendum)
Please follow up with DR.Advertising account executive. Please call if you have any questions or concerns.

## 2018-08-17 NOTE — Progress Notes (Signed)
  Patient ID: Samantha Henry, female   DOB: 1945/03/15, 73 y.o.   MRN: [id number]  HISTORY: She returns today in follow-up.  She has had her appointment with Dr. Berton Mount.  He has recommended that she undergo SBRT for her left upper lobe mass.  She and her daughter who accompany her today are in agreement with that.  I did review with them again the CT scan, PET scan and pulmonary function studies.  Her PFTs show an FEV1 of 38% predicted and a DLCO that was also 38% of predicted.   There were no vitals filed for this visit.   EXAM:    Resp: Lungs are diminished bilaterally.  No respiratory distress, normal effort.  There are end expiratory wheezes Heart:  Regular with a grade 2/6 systolic murmur. Abd:  Abdomen is soft, non distended and non tender. No masses are palpable.  There is no rebound and no guarding.  Neurological: Alert and oriented to person, place, and time. Coordination normal.  Skin: Skin is warm and dry. No rash noted. No diaphoretic. No erythema. No pallor.  Psychiatric: Normal mood and affect. Normal behavior. Judgment and thought content normal.    ASSESSMENT: I have independently reviewed her pulmonary function studies, PET scan and CT scan.  I believe she has a left upper lobe non-small cell carcinoma stage I   PLAN:   I agree with Dr. Donette Larry assessment and have recommended that she complete her SBRT with him.  No additional follow-up was made at this time.    Nestor Lewandowsky, MD

## 2018-08-23 ENCOUNTER — Other Ambulatory Visit: Payer: PPO

## 2018-08-27 ENCOUNTER — Ambulatory Visit
Admission: RE | Admit: 2018-08-27 | Discharge: 2018-08-27 | Disposition: A | Discharge status: Home / Self Care | Payer: PPO | Source: Ambulatory Visit | Attending: Radiation Oncology | Admitting: Radiation Oncology

## 2018-08-27 ENCOUNTER — Ambulatory Visit: Payer: PPO

## 2018-08-27 ENCOUNTER — Encounter: Payer: Self-pay | Admitting: *Deleted

## 2018-08-27 ENCOUNTER — Other Ambulatory Visit: Payer: Self-pay

## 2018-08-27 DIAGNOSIS — Z510 Encounter for antineoplastic radiation therapy: Secondary | ICD-10-CM | POA: Diagnosis not present

## 2018-08-27 DIAGNOSIS — J449 Chronic obstructive pulmonary disease, unspecified: Secondary | ICD-10-CM | POA: Diagnosis not present

## 2018-08-27 NOTE — Progress Notes (Signed)
  Oncology Nurse Navigator Documentation  Navigator Location: [facility] (08/27/18 1500)   )Navigator Encounter Type: Treatment (08/27/18 1500)                   Treatment Initiated Date: 08/27/18 (08/27/18 1500) Patient Visit Type: RadOnc (08/27/18 1500) Treatment Phase: First Radiation Tx (08/27/18 1500) Barriers/Navigation Needs: No Barriers At This Time (08/27/18 1500)                          Time Spent with Patient: 15 (08/27/18 1500)

## 2018-08-29 ENCOUNTER — Ambulatory Visit
Admission: RE | Admit: 2018-08-29 | Discharge: 2018-08-29 | Disposition: A | Discharge status: Home / Self Care | Payer: PPO | Source: Ambulatory Visit | Attending: Radiation Oncology | Admitting: Radiation Oncology

## 2018-08-29 ENCOUNTER — Other Ambulatory Visit: Payer: Self-pay

## 2018-08-29 DIAGNOSIS — Z510 Encounter for antineoplastic radiation therapy: Secondary | ICD-10-CM | POA: Diagnosis not present

## 2018-08-29 DIAGNOSIS — R918 Other nonspecific abnormal finding of lung field: Secondary | ICD-10-CM | POA: Diagnosis not present

## 2018-08-29 DIAGNOSIS — R0600 Dyspnea, unspecified: Secondary | ICD-10-CM | POA: Diagnosis not present

## 2018-08-29 DIAGNOSIS — R062 Wheezing: Secondary | ICD-10-CM | POA: Diagnosis not present

## 2018-08-29 DIAGNOSIS — J449 Chronic obstructive pulmonary disease, unspecified: Secondary | ICD-10-CM | POA: Diagnosis not present

## 2018-09-03 ENCOUNTER — Ambulatory Visit
Admission: RE | Admit: 2018-09-03 | Discharge: 2018-09-03 | Disposition: A | Discharge status: Home / Self Care | Payer: PPO | Source: Ambulatory Visit | Attending: Radiation Oncology | Admitting: Radiation Oncology

## 2018-09-03 ENCOUNTER — Other Ambulatory Visit: Payer: Self-pay

## 2018-09-03 DIAGNOSIS — Z510 Encounter for antineoplastic radiation therapy: Secondary | ICD-10-CM | POA: Diagnosis not present

## 2018-09-05 ENCOUNTER — Ambulatory Visit
Admission: RE | Admit: 2018-09-05 | Discharge: 2018-09-05 | Disposition: A | Discharge status: Home / Self Care | Payer: PPO | Source: Ambulatory Visit | Attending: Radiation Oncology | Admitting: Radiation Oncology

## 2018-09-05 ENCOUNTER — Other Ambulatory Visit: Payer: Self-pay

## 2018-09-05 DIAGNOSIS — Z510 Encounter for antineoplastic radiation therapy: Secondary | ICD-10-CM | POA: Diagnosis not present

## 2018-09-10 ENCOUNTER — Ambulatory Visit
Admission: RE | Admit: 2018-09-10 | Discharge: 2018-09-10 | Disposition: A | Discharge status: Home / Self Care | Payer: PPO | Source: Ambulatory Visit | Attending: Radiation Oncology | Admitting: Radiation Oncology

## 2018-09-10 ENCOUNTER — Other Ambulatory Visit: Payer: Self-pay

## 2018-09-10 DIAGNOSIS — R918 Other nonspecific abnormal finding of lung field: Secondary | ICD-10-CM | POA: Diagnosis not present

## 2018-09-10 DIAGNOSIS — Z87891 Personal history of nicotine dependence: Secondary | ICD-10-CM | POA: Diagnosis not present

## 2018-09-10 DIAGNOSIS — Z510 Encounter for antineoplastic radiation therapy: Secondary | ICD-10-CM | POA: Diagnosis not present

## 2018-09-27 DIAGNOSIS — J449 Chronic obstructive pulmonary disease, unspecified: Secondary | ICD-10-CM | POA: Diagnosis not present

## 2018-10-12 ENCOUNTER — Other Ambulatory Visit: Payer: Self-pay

## 2018-10-15 ENCOUNTER — Other Ambulatory Visit: Payer: Self-pay

## 2018-10-15 ENCOUNTER — Other Ambulatory Visit: Payer: Self-pay | Admitting: *Deleted

## 2018-10-15 ENCOUNTER — Ambulatory Visit
Admission: RE | Admit: 2018-10-15 | Discharge: 2018-10-15 | Disposition: A | Discharge status: Home / Self Care | Payer: PPO | Source: Ambulatory Visit | Attending: Radiation Oncology | Admitting: Radiation Oncology

## 2018-10-15 ENCOUNTER — Encounter: Payer: Self-pay | Admitting: Radiation Oncology

## 2018-10-15 VITALS — BP 132/57 | HR 74 | Temp 97.4°F

## 2018-10-15 DIAGNOSIS — R911 Solitary pulmonary nodule: Secondary | ICD-10-CM

## 2018-10-15 DIAGNOSIS — C3412 Malignant neoplasm of upper lobe, left bronchus or lung: Secondary | ICD-10-CM | POA: Diagnosis not present

## 2018-10-15 DIAGNOSIS — Z923 Personal history of irradiation: Secondary | ICD-10-CM | POA: Diagnosis not present

## 2018-10-15 NOTE — Progress Notes (Signed)
Radiation Oncology Follow up Note  Name: Samantha Henry   Date:   10/15/2018 MRN:  [id number] DOB: [birth year]    This 73 y.o. female presents to the clinic today for 1 month follow-up status post SBRT for stage I non-small cell lung cancer of left upper lobe.  REFERRING PROVIDER: Birdie Sons, MD  HPI: Patient is a 73 year old female now at 1 month having completed SBRT to her left upper lobe for a stage I non-small cell lung cancer.  Seen today in routine follow-up she is doing well continues to complain of shortness of breath which is standard for her.  Specifically [place] cough hemoptysis any dysphasia or skin reaction..  COMPLICATIONS OF TREATMENT: none  FOLLOW UP COMPLIANCE: keeps appointments   PHYSICAL EXAM:  BP (!) 132/57   Pulse 74   Temp (!) 97.4 F (36.3 C)  Wheelchair-bound female somewhat short of breath not on nasal oxygen well-developed well-nourished patient in NAD. HEENT reveals PERLA, EOMI, discs not visualized.  Oral cavity is clear. No oral mucosal lesions are identified. Neck is clear without evidence of cervical or supraclavicular adenopathy. Lungs are clear to A&P. Cardiac examination is essentially unremarkable with regular rate and rhythm without murmur rub or thrill. Abdomen is benign with no organomegaly or masses noted. Motor sensory and DTR levels are equal and symmetric in the upper and lower extremities. Cranial nerves II through XII are grossly intact. Proprioception is intact. No peripheral adenopathy or edema is identified. No motor or sensory levels are noted. Crude visual fields are within normal range.  RADIOLOGY RESULTS: No current films to review  PLAN: Present time patient is doing well stable really no significant changes from prior to her SBRT.  Of asked to see her back in 3 months for follow-up with a CT scan of the chest with contrast prior to that visit.  Patient knows to call sooner with any concerns at any time.  I would like to take this  opportunity to thank you for allowing me to participate in the care of your patient.Noreene Filbert, MD

## 2018-10-23 ENCOUNTER — Telehealth: Payer: Self-pay | Admitting: Family Medicine

## 2018-10-23 ENCOUNTER — Encounter: Payer: Self-pay | Admitting: Family Medicine

## 2018-10-23 NOTE — Chronic Care Management (AMB) (Signed)
Chronic Care Management   Note  10/23/2018 Name: MACKINLEY CASSADAY MRN: [id number] DOB: 1945-09-13  LESLIE JESTER is a 73 y.o. year old female who is a primary care patient of Caryn Section, Kirstie Peri, MD. I reached out to [name] by phone today in response to a referral sent by [name] health plan.     Ms. Helming was given information about Chronic Care Management services today including:  1. CCM service includes personalized support from designated clinical staff supervised by her physician, including individualized plan of care and coordination with other care providers 2. 24/7 contact phone numbers for assistance for urgent and routine care needs. 3. Service will only be billed when office clinical staff spend 20 minutes or more in a month to coordinate care. 4. Only one practitioner may furnish and bill the service in a calendar month. 5. The patient may stop CCM services at any time (effective at the end of the month) by phone call to the office staff. 6. The patient will be responsible for cost sharing (co-pay) of up to 20% of the service fee (after annual deductible is met).  Patient did not agree to enrollment in care management services and does not wish to consider at this time.  Follow up plan: The patient has been provided with contact information for the chronic care management team and has been advised to call with any health related questions or concerns.   [place]  [email or web address]   ??[phone]

## 2018-10-27 DIAGNOSIS — J449 Chronic obstructive pulmonary disease, unspecified: Secondary | ICD-10-CM | POA: Diagnosis not present

## 2018-11-12 DIAGNOSIS — R0602 Shortness of breath: Secondary | ICD-10-CM | POA: Diagnosis not present

## 2018-11-12 DIAGNOSIS — R062 Wheezing: Secondary | ICD-10-CM | POA: Diagnosis not present

## 2018-11-12 DIAGNOSIS — J441 Chronic obstructive pulmonary disease with (acute) exacerbation: Secondary | ICD-10-CM | POA: Diagnosis not present

## 2018-11-21 NOTE — Progress Notes (Signed)
Subjective:   Samantha Henry is a 73 y.o. female who presents for Medicare Annual (Subsequent) preventive examination.    This visit is being conducted through telemedicine due to the COVID-19 pandemic. This patient has given me verbal consent via doximity to conduct this visit, patient states they are participating from their home address. Some vital signs may be absent or patient reported.    Patient identification: identified by name, DOB, and current address  Review of Systems:  N/A  Cardiac Risk Factors include: advanced age (>83mn, >>49women);diabetes mellitus;hypertension;obesity (BMI >30kg/m2)     Objective:     Vitals: There were no vitals taken for this visit.  There is no height or weight on file to calculate BMI. Unable to obtain vitals due to visit being conducted via telephonically.   Advanced Directives 11/22/2018 08/13/2018 06/08/2018 03/24/2018 11/16/2017 11/15/2016 06/14/2016  Does Patient Have a Medical Advance Directive? Yes No Yes Yes Yes Yes No  Type of [place] - Healthcare Power of [place];Healthcare Power of Attorney -  Does patient want to make changes to medical advance directive? - - No - Patient declined - - - -  Copy of [place] Chart? Yes - validated most recent copy scanned in chart (See row information) - No - copy requested No - copy requested Yes - validated most recent copy scanned in chart (See row information) No - copy requested -  Would patient like information on creating a medical advance directive? - No - Patient declined - No - Patient declined - - Yes (Inpatient - patient requests chaplain consult to create a medical advance directive)    Tobacco Social History   Tobacco Use  Smoking Status Former Smoker  . Packs/day: 1.50  . Years: 48.00  . Pack years: 72.00  . Types: Cigarettes  .  Quit date: 01/11/2007  . Years since quitting: 11.8  Smokeless Tobacco Never Used  Tobacco Comment   smoked up to 2 ppd since 73yo. quit in 2009     Counseling given: Not Answered Comment: smoked up to 2 ppd since 73yo. quit in 2009   Clinical Intake:  Pre-visit preparation completed: Yes  Pain : No/denies pain Pain Score: 0-No pain     Nutritional Risks: None Diabetes: Yes  How often do you need to have someone help you when you read instructions, pamphlets, or other written materials from your doctor or pharmacy?: 1 - Never   Diabetes:  Is the patient diabetic?  Yes type 2 If diabetic, was a CBG obtained today?  No  Did the patient bring in their glucometer from home?  No  How often do you monitor your CBG's? Once a day.   Financial Strains and Diabetes Management:  Are you having any financial strains with the device, your supplies or your medication? No .  Does the patient want to be seen by Chronic Care Management for management of their diabetes? No Would the patient like to be referred to a Nutritionist or for Diabetic Management?  No   Diabetic Exams:  Diabetic Eye Exam: Completed 03/30/17. Overdue for diabetic eye exam. Pt has been advised about the importance in completing this exam.   Diabetic Foot Exam: Completed 04/12/13. Pt has been advised about the importance in completing this exam. Note made to follow up on this at next in office visit.  Interpreter Needed?: No  Information entered by :: [facility], LPN  Past Medical History:  Diagnosis Date  . Arthritis    osteo  . Cancer ([place]) 1969   CERVICAL CA  . COPD (chronic obstructive pulmonary disease) ([place])   . Diabetes mellitus without complication ([place])   . Diabetic peripheral neuropathy ([place])   . Environmental and seasonal allergies   . GERD (gastroesophageal reflux disease)   . History of cervical cancer   . Hypercholesteremia   . Hypertension   . Hypothyroidism   . Neuropathy   . Sleep  apnea   . TIA (transient ischemic attack)   . Von Willebrand's disease [facility])    Past Surgical History:  Procedure Laterality Date  . ABDOMINAL HYSTERECTOMY  1969   due to cervical cancer   . APPENDECTOMY    . BLADDER SURGERY  1997   bladder tacking  . BREAST BIOPSY Left 01/21/2015   FIBROADENOMATOUS CHANGE WITH ASSOCIATED MICROCALCIFICATIONS  . CARDIAC CATHETERIZATION    . CARPAL TUNNEL RELEASE  2010  . colonoscopy and upper endoscopy'    . COLONOSCOPY WITH PROPOFOL N/A 02/25/2016   Procedure: COLONOSCOPY WITH PROPOFOL;  Surgeon: Lollie Sails, MD;  Location: [facility]COPY;  Service: Endoscopy;  Laterality: N/A;  . OOPHORECTOMY    . TENDON RELEASE  2010   right foot, Dr. Vickki Muff   . TOTAL KNEE ARTHROPLASTY Left 06/14/2016   Procedure: TOTAL KNEE ARTHROPLASTY;  Surgeon: Corky Mull, MD;  Location: [facility] ORS;  Service: Orthopedics;  Laterality: Left;   Family History  Problem Relation Age of Onset  . Alzheimer's disease Mother   . Cancer Father   . Diabetes Sister   . Colon cancer Sister   . Diabetes Brother   . Cancer Sister 80       melanoma  . Breast cancer Maternal Aunt   . Hodgkin's lymphoma Sister    Social History   Socioeconomic History  . Marital status: Single    Spouse name: Not on file  . Number of children: 3  . Years of education: Not on file  . Highest education level: 8th grade  Occupational History  . Occupation: retired  Scientific laboratory technician  . Financial resource strain: Not hard at all  . Food insecurity    Worry: Never true    Inability: Never true  . Transportation needs    Medical: No    Non-medical: No  Tobacco Use  . Smoking status: Former Smoker    Packs/day: 1.50    Years: 48.00    Pack years: 72.00    Types: Cigarettes    Quit date: 01/11/2007    Years since quitting: 11.8  . Smokeless tobacco: Never Used  . Tobacco comment: smoked up to 2 ppd since 73 yo. quit in 2009  Substance and Sexual Activity  . Alcohol use: No    Alcohol/week:  0.0 standard drinks  . Drug use: No  . Sexual activity: Not on file  Lifestyle  . Physical activity    Days per week: 0 days    Minutes per session: 0 min  . Stress: Not at all  Relationships  . Social Herbalist on phone: Patient refused    Gets together: Patient refused    Attends religious service: Patient refused    Active member of club or organization: Patient refused    Attends meetings of clubs or organizations: Patient refused    Relationship status: Patient refused  Other Topics Concern  .  Not on file  Social History Narrative  . Not on file   Declined reviewing medication at this time. Pt to bring in a paper copy to review at in office visit tomorrow.  Outpatient Encounter Medications as of 11/22/2018  Medication Sig  . acetaminophen (TYLENOL) 650 MG CR tablet Take 650 mg by mouth every 8 (eight) hours as needed for pain.  Marland Kitchen albuterol (PROVENTIL HFA;VENTOLIN HFA) 108 (90 Base) MCG/ACT inhaler Inhale 2 puffs into the lungs every 6 (six) hours as needed for wheezing or shortness of breath.  Marland Kitchen aspirin 81 MG tablet Take 1 tablet by mouth daily.  . Blood Glucose Monitoring Suppl (ONE TOUCH ULTRA 2) w/Device KIT Use daily to check blood sugar for type 2 diabetes E11.9  . budesonide-formoterol (SYMBICORT) 160-4.5 MCG/ACT inhaler Inhale 2 puffs into the lungs 2 (two) times daily.  . calcium carbonate (CALCIUM 600) 600 MG TABS tablet Take 500 mg by mouth 2 (two) times daily with a meal.   . calcium carbonate (TUMS EX) 750 MG chewable tablet Chew 2 tablets by mouth 2 (two) times daily as needed for heartburn.  . cholecalciferol (VITAMIN D) 400 units TABS tablet Take 400 Units by mouth daily.  Marland Kitchen glucose blood (ONE TOUCH ULTRA TEST) test strip Use as instructed to check blood sugar daily for type 2 diabetes E11.9  . levothyroxine (SYNTHROID, LEVOTHROID) 125 MCG tablet TAKE 1 TABLET BY MOUTH ONCE DAILY BEFORE BREAKFAST  . losartan (COZAAR) 50 MG tablet Take 1 tablet by  mouth once daily  . metFORMIN (GLUCOPHAGE-XR) 500 MG 24 hr tablet TAKE 2 TABLETS BY MOUTH IN THE MORNING, THEN 2 TABLETS BY MOUTH IN THE EVENING  . MULTIPLE VITAMIN PO Take 1 tablet by mouth daily.  . Omega-3 Fatty Acids (FISH OIL BURP-LESS) 1000 MG CAPS Take 1 tablet by mouth 2 (two) times daily.   . pseudoephedrine-dextromethorphan-guaifenesin (ROBITUSSIN-PE) 30-10-100 MG/5ML solution Take 10 mLs by mouth 4 (four) times daily as needed for cough.  . RABEprazole (ACIPHEX) 20 MG tablet Take 1 tablet by mouth once daily  . rosuvastatin (CRESTOR) 20 MG tablet TAKE 1 TABLET BY MOUTH ONCE DAILY  . Tiotropium Bromide Monohydrate (SPIRIVA RESPIMAT) 1.25 MCG/ACT AERS Inhale 1 spray into the lungs every morning.  . trolamine salicylate (ASPERCREME) 10 % cream Apply 1 application topically as needed for muscle pain.  . vitamin B-12 (CYANOCOBALAMIN) 50 MCG tablet Take 50 mcg by mouth daily.   No facility-administered encounter medications on file as of 11/22/2018.     Activities of Daily Living In your present state of health, do you have any difficulty performing the following activities: 11/22/2018  Hearing? N  Vision? Y  Comment Has an eyelid problem that is being followed up by an optometrist.  Difficulty concentrating or making decisions? N  Walking or climbing stairs? N  Dressing or bathing? N  Doing errands, shopping? N  Preparing Food and eating ? N  Using the Toilet? N  In the past six months, have you accidently leaked urine? N  Do you have problems with loss of bowel control? N  Managing your Medications? N  Managing your Finances? N  Housekeeping or managing your Housekeeping? N  Some recent data might be hidden    Patient Care Team: Birdie Sons, MD as PCP - General (Family Medicine) Erby Pian, MD as Consulting Physician (Pulmonary Disease) Anell Barr, OD (Optometry)    Assessment:   This is a routine wellness examination for Samantha Henry.  Exercise Activities  and Dietary recommendations Current Exercise Habits: The patient does not participate in regular exercise at present, Exercise limited by: orthopedic condition(s);respiratory conditions(s)  Goals    . Exercise 3x per week (30 min per time)     Recommend to start walking 3 days a week for at least 30 minutes at a time.     . Increase water intake     Starting 12/31/15, I will continue to drink 6-8 glasses of water a day.    . Increase water intake     Recommend cutting back on the amount of coffee and substituting with water to drink at least 4-6 glasses a day.   11/16/17- Continue trying to cut back on coffee and increase water intake to 6-8 glasses a day.        Fall Risk: Fall Risk  11/22/2018 08/17/2018 11/16/2017 11/15/2016 12/31/2015  Falls in the past year? 0 0 0 Yes Yes  Number falls in past yr: 0 - - 1 2 or more  Injury with Fall? 0 - - No No  Follow up - - - Falls prevention discussed Falls prevention discussed    FALL RISK PREVENTION PERTAINING TO THE HOME:  Any stairs in or around the home? Yes  If so, are there any without handrails? No   Home free of loose throw rugs in walkways, pet beds, electrical cords, etc? Yes  Adequate lighting in your home to reduce risk of falls? Yes   ASSISTIVE DEVICES UTILIZED TO PREVENT FALLS:  Life alert? No  Use of a cane, walker or w/c? No Grab bars in the bathroom? No  Shower chair or bench in shower? Yes  Elevated toilet seat or a handicapped toilet? No   TIMED UP AND GO:  Was the test performed? No .    Depression Screen PHQ 2/9 Scores 11/22/2018 11/16/2017 11/15/2016 12/31/2015  PHQ - 2 Score 2 5 0 0  PHQ- 9 Score 2 8 - -     Cognitive Function     6CIT Screen 11/22/2018 11/16/2017 12/31/2015  What Year? 0 points 0 points 0 points  What month? 0 points 0 points 0 points  What time? 3 points 0 points 0 points  Count back from 20 0 points 0 points 0 points  Months in reverse 0 points 4 points 2 points  Repeat phrase  6 points 4 points 4 points  Total Score _0 Immunization History  Administered Date(s) Administered  . Influenza, High Dose Seasonal PF 10/19/2015, 09/15/2016, 09/18/2017, 10/06/2018  . Influenza-Unspecified 02/02/2015  . Pneumococcal Conjugate-13 04/12/2013, 09/18/2017  . Pneumococcal Polysaccharide-23 11/24/2011  . Tdap 06/06/2008  . Zoster 09/11/2010  . Zoster Recombinat (Shingrix) 12/13/2017, 07/10/2018    Qualifies for Shingles Vaccine? Completed series  Tdap: Although this vaccine is not a covered service during a Wellness Exam, does the patient still wish to receive this vaccine today?  No .   Flu Vaccine: Completed series  Pneumococcal Vaccine: Completed series  Screening Tests Health Maintenance  Topic Date Due  . FOOT EXAM  11/20/1955  . OPHTHALMOLOGY EXAM  03/31/2018  . HEMOGLOBIN A1C  05/17/2018  . TETANUS/TDAP  06/07/2018  . MAMMOGRAM  01/10/2020  . DEXA SCAN  02/24/2020  . COLONOSCOPY  02/24/2021  . INFLUENZA VACCINE  Completed  . Hepatitis C Screening  Completed  . PNA vac Low Risk Adult  Completed    Cancer Screenings:  Colorectal Screening: Completed 02/25/16. Repeat every 5 years.  Mammogram: Completed 01/09/18.  Bone Density: Completed 02/23/17. Results reflect OSTEOPENIA. Repeat every 5 years.   Lung Cancer Screening: (Low Dose CT Chest recommended if Age 20-80 years, 30 pack-year currently smoking OR have quit w/in 15years.) does qualify however completed this 07/10/18. Follow up screening scheduled for 01/15/19.  Additional Screening:  Hepatitis C Screening: Up to date  Dental Screening: Recommended annual dental exams for proper oral hygiene   Community Resource Referral:  CRR required this visit?  No       Plan:  I have personally reviewed and addressed the Medicare Annual Wellness questionnaire and have noted the following in the patient's chart:  A. Medical and social history B. Use of alcohol, tobacco or illicit drugs  C.  Current medications and supplements D. Functional ability and status E.  Nutritional status F.  Physical activity G. Advance directives H. List of other physicians I.  Hospitalizations, surgeries, and ER visits in previous 12 months J.  [place] such as hearing and vision if needed, cognitive and depression L. Referrals and appointments   In addition, I have reviewed and discussed with patient certain preventive protocols, quality metrics, and best practice recommendations. A written personalized care plan for preventive services as well as general preventive health recommendations were provided to patient.   [place] Score, [place]  65/68/1275 Nurse Health Advisor   Nurse Notes: Pt needs a diabetic foot exam and Hgb A1c checked at tomorrows in office apt. Pt does not plan to schedule an eye exam at this time. Pt declines reviewing medications over the phone today.

## 2018-11-22 ENCOUNTER — Ambulatory Visit (INDEPENDENT_AMBULATORY_CARE_PROVIDER_SITE_OTHER): Payer: PPO

## 2018-11-22 ENCOUNTER — Other Ambulatory Visit: Payer: Self-pay

## 2018-11-22 DIAGNOSIS — Z0000 Encounter for general adult medical examination without abnormal findings: Secondary | ICD-10-CM | POA: Diagnosis not present

## 2018-11-23 ENCOUNTER — Ambulatory Visit (INDEPENDENT_AMBULATORY_CARE_PROVIDER_SITE_OTHER): Payer: PPO | Admitting: Family Medicine

## 2018-11-23 ENCOUNTER — Encounter: Payer: Self-pay | Admitting: Family Medicine

## 2018-11-23 ENCOUNTER — Other Ambulatory Visit: Payer: Self-pay

## 2018-11-23 ENCOUNTER — Ambulatory Visit: Payer: PPO

## 2018-11-23 VITALS — BP 122/62 | HR 71 | Temp 96.2°F | Resp 18 | Ht 67.0 in | Wt 214.0 lb

## 2018-11-23 DIAGNOSIS — G629 Polyneuropathy, unspecified: Secondary | ICD-10-CM | POA: Diagnosis not present

## 2018-11-23 DIAGNOSIS — D539 Nutritional anemia, unspecified: Secondary | ICD-10-CM

## 2018-11-23 DIAGNOSIS — D509 Iron deficiency anemia, unspecified: Secondary | ICD-10-CM | POA: Insufficient documentation

## 2018-11-23 DIAGNOSIS — G252 Other specified forms of tremor: Secondary | ICD-10-CM

## 2018-11-23 DIAGNOSIS — I700 Atherosclerosis of aorta: Secondary | ICD-10-CM

## 2018-11-23 DIAGNOSIS — D6800 Von Willebrand disease, unspecified: Secondary | ICD-10-CM

## 2018-11-23 DIAGNOSIS — R011 Cardiac murmur, unspecified: Secondary | ICD-10-CM | POA: Diagnosis not present

## 2018-11-23 DIAGNOSIS — D692 Other nonthrombocytopenic purpura: Secondary | ICD-10-CM

## 2018-11-23 DIAGNOSIS — E039 Hypothyroidism, unspecified: Secondary | ICD-10-CM | POA: Diagnosis not present

## 2018-11-23 DIAGNOSIS — E1149 Type 2 diabetes mellitus with other diabetic neurological complication: Secondary | ICD-10-CM | POA: Diagnosis not present

## 2018-11-23 DIAGNOSIS — R413 Other amnesia: Secondary | ICD-10-CM

## 2018-11-23 DIAGNOSIS — D649 Anemia, unspecified: Secondary | ICD-10-CM | POA: Insufficient documentation

## 2018-11-23 DIAGNOSIS — R911 Solitary pulmonary nodule: Secondary | ICD-10-CM

## 2018-11-23 DIAGNOSIS — R609 Edema, unspecified: Secondary | ICD-10-CM

## 2018-11-23 DIAGNOSIS — G2581 Restless legs syndrome: Secondary | ICD-10-CM | POA: Diagnosis not present

## 2018-11-23 DIAGNOSIS — Z0000 Encounter for general adult medical examination without abnormal findings: Secondary | ICD-10-CM

## 2018-11-23 NOTE — Progress Notes (Signed)
Patient: Samantha Henry, Female    DOB: 1945-12-13, 73 y.o.   MRN: [id number] Visit Date: 11/23/2018  Today's Provider: Lelon Huh, MD   Chief Complaint  Patient presents with  . Annual Exam  . Diabetes  . Hyperlipidemia  . Hypertension  . Hypothyroidism   Subjective:     Complete Physical Samantha Henry is a 73 y.o. female. She feels fairly well. She reports no exercising. She reports she is sleeping fairly well.  She has several complaints, needs follow up for multiple chronic medication conditions in addition to annual physical today.   -----------------------------------------------------------   Diabetes Mellitus Type II, Follow-up:   Lab Results  Component Value Date   HGBA1C 6.3 (H) 11/16/2017   HGBA1C 6.8 (A) 06/26/2017   HGBA1C 6.3 (H) 12/23/2016    Last seen for diabetes 1 years ago.  Management since then includes no changes. She reports good compliance with treatment. She is not having side effects.  . Home blood sugar records: patient unsure of the range of her blood sugar readings  Episodes of hypoglycemia? no   Current insulin regiment: Is not on insulin Most Recent Eye Exam: >1 year ago Weight trend: increasing steadily Prior visit with dietician: No Current exercise: none Current diet habits: well balanced  Pertinent Labs:    Component Value Date/Time   CHOL 143 11/16/2017 1145   TRIG 56 11/16/2017 1145   HDL 74 11/16/2017 1145   LDLCALC 58 11/16/2017 1145   LDLCALC 65 12/23/2016 1141   CREATININE 0.48 06/08/2018 1925   CREATININE 0.58 (L) 12/23/2016 1141    Wt Readings from Last 3 Encounters:  11/23/18 214 lb (97.1 kg)  07/27/18 207 lb (93.9 kg)  07/10/18 213 lb (96.6 kg)    ------------------------------------------------------------------------  Hypertension, follow-up:  BP Readings from Last 3 Encounters:  11/23/18 122/62  10/15/18 (!) 132/57  08/13/18 (!) 145/67    She was last seen for hypertension 1  years ago.  BP at that visit was 132/60. Management since that visit includes no changes. She reports good compliance with treatment. She is not having side effects.  She is not exercising. She is adherent to low salt diet.   Outside blood pressures are not being checked.  Patient denies chest pain, chest pressure/discomfort, irregular heart beat, orthopnea, paroxysmal nocturnal dyspnea and syncope.   Cardiovascular risk factors include advanced age (older than 24 for men, 36 for women), diabetes mellitus, dyslipidemia and hypertension.  Use of agents associated with hypertension: NSAIDS.     Weight trend: increasing steadily Wt Readings from Last 3 Encounters:  11/23/18 214 lb (97.1 kg)  07/27/18 207 lb (93.9 kg)  07/10/18 213 lb (96.6 kg)    Current diet: well balanced  ------------------------------------------------------------------------  Lipid/Cholesterol, Follow-up:   Last seen for this1 years ago.  Management changes since that visit include none. . Last Lipid Panel:    Component Value Date/Time   CHOL 143 11/16/2017 1145   TRIG 56 11/16/2017 1145   HDL 74 11/16/2017 1145   CHOLHDL 1.9 11/16/2017 1145   CHOLHDL 2.0 12/23/2016 1141   LDLCALC 58 11/16/2017 1145   LDLCALC 65 12/23/2016 1141    Risk factors for vascular disease include diabetes mellitus, hypercholesterolemia and hypertension  She reports good compliance with treatment. She is not having side effects.  Current symptoms include none and have been stable. Weight trend: increasing steadily Prior visit with dietician: no Current diet: well balanced Current exercise: none  Wt  Readings from Last 3 Encounters:  11/23/18 214 lb (97.1 kg)  07/27/18 207 lb (93.9 kg)  07/10/18 213 lb (96.6 kg)    -------------------------------------------------------------------  Follow up for Chronic back pain:  The patient was last seen for this 10 months ago. Changes made at last visit include prescribing  Methocrabamol.  She reports good compliance with treatment. Patient is not currently taking any medication for chronic back pain. She feels that condition is stable. She is not having side effects.   ------------------------------------------------------------------------------------  Follow up for Neuropathy:  The patient was last seen for this 1 years ago. Changes made at last visit include advising patient to try OTC Vitamin B12 1000units daily.  She reports good compliance with treatment. She feels that condition is Unchanged. She is not having side effects.   ------------------------------------------------------------------------------------  Follow up for Hypothyroidism:  The patient was last seen for this 1 years ago. Changes made at last visit include none. Lab Results  Component Value Date   TSH 1.070 11/16/2017    She reports good compliance with treatment. She feels that condition is Unchanged. She is not having side effects.   ------------------------------------------------------------------------------------ Her daughter is here with her today and states patient has become increasingly forgetful with routine activities. There is a strong family history of senile dementia and she is concerned patient may be developing this.   She has also notice tremor in both hands, left more than right which she primarily notices when using hands and writing.   She is has also been having a lot of bruising on her arms that she was concerned about.   She has recently been treated for lung tumor with radiation therapy by Dr. Donella Stade  She continues on home oxygen which she typically wears all day and night at 3 lpm, although her daughter sometimes increases to 4 when patient gets short of breath. She continues to follow up routinely with Dr. Raul Del   She is also concerned about worsening swelling in both lower legs over the last few months.   Review of Systems   Constitutional: Negative for appetite change, chills, fatigue and fever.  HENT: Positive for drooling, ear pain and rhinorrhea.   Eyes: Positive for pain and redness.  Respiratory: Positive for cough, shortness of breath and wheezing. Negative for chest tightness.   Cardiovascular: Positive for leg swelling. Negative for chest pain and palpitations.  Gastrointestinal: Negative for abdominal pain, nausea and vomiting.  Genitourinary: Positive for frequency.  Musculoskeletal: Positive for back pain.  Neurological: Positive for tremors. Negative for dizziness and weakness.  Hematological: Bruises/bleeds easily.  Psychiatric/Behavioral: Positive for agitation and confusion.    Social History   Socioeconomic History  . Marital status: Single    Spouse name: Not on file  . Number of children: 3  . Years of education: Not on file  . Highest education level: 8th grade  Occupational History  . Occupation: retired  Scientific laboratory technician  . Financial resource strain: Not hard at all  . Food insecurity    Worry: Never true    Inability: Never true  . Transportation needs    Medical: No    Non-medical: No  Tobacco Use  . Smoking status: Former Smoker    Packs/day: 1.50    Years: 48.00    Pack years: 72.00    Types: Cigarettes    Quit date: 01/11/2007    Years since quitting: 11.8  . Smokeless tobacco: Never Used  . Tobacco comment: smoked up to 2 ppd  since 73 yo. quit in 2009  Substance and Sexual Activity  . Alcohol use: No    Alcohol/week: 0.0 standard drinks  . Drug use: No  . Sexual activity: Not on file  Lifestyle  . Physical activity    Days per week: 0 days    Minutes per session: 0 min  . Stress: Not at all  Relationships  . Social Herbalist on phone: Patient refused    Gets together: Patient refused    Attends religious service: Patient refused    Active member of club or organization: Patient refused    Attends meetings of clubs or organizations: Patient refused     Relationship status: Patient refused  . Intimate partner violence    Fear of current or ex partner: Patient refused    Emotionally abused: Patient refused    Physically abused: Patient refused    Forced sexual activity: Patient refused  Other Topics Concern  . Not on file  Social History Narrative  . Not on file    Past Medical History:  Diagnosis Date  . Arthritis    osteo  . Cancer ([place]) 1969   CERVICAL CA  . COPD (chronic obstructive pulmonary disease) ([place])   . Diabetes mellitus without complication ([place])   . Diabetic peripheral neuropathy ([place])   . Environmental and seasonal allergies   . GERD (gastroesophageal reflux disease)   . History of cervical cancer   . Hypercholesteremia   . Hypertension   . Hypothyroidism   . Neuropathy   . Sleep apnea   . TIA (transient ischemic attack)   . Von Willebrand's disease [facility])      Patient Active Problem List   Diagnosis Date Noted  . Aortic atherosclerosis ([place]) 07/12/2018  . Coronary artery disease 07/12/2018  . Aortic valve calcification 07/12/2018  . DDD (degenerative disc disease), cervical 02/01/2018  . Chronic back pain 02/01/2018  . Morbid obesity ([place]) 06/26/2017  . History of smoking 30 or more pack years 05/12/2017  . Status post total knee replacement using cement, left 06/14/2016  . Polyneuropathy 02/26/2015  . Osteoarthritis 08/02/2014  . Allergic rhinitis 07/29/2014  . COPD (chronic obstructive pulmonary disease) ([place]) 07/25/2014  . Type 2 diabetes mellitus with neurological complications ([place]) 25/63/8937  . Diverticulosis of colon 07/25/2014  . Edema 07/25/2014  . GERD (gastroesophageal reflux disease) 07/25/2014  . Hypercholesteremia 07/25/2014  . Hypertension 07/25/2014  . Hypothyroid 07/25/2014  . Cramps of lower extremity 07/25/2014  . Microalbuminuria 07/25/2014  . Nail dystrophy 07/25/2014  . Osteoarthritis of leg 07/25/2014  . Restless legs syndrome 07/25/2014  . TIA (transient ischemic  attack) 07/25/2014  . Von Willebrand's disease ([place]) 07/25/2014  . OSA (obstructive sleep apnea) 10/31/2013  . History of adenomatous polyp of colon 08/26/2005    Past Surgical History:  Procedure Laterality Date  . ABDOMINAL HYSTERECTOMY  1969   due to cervical cancer   . APPENDECTOMY    . BLADDER SURGERY  1997   bladder tacking  . BREAST BIOPSY Left 01/21/2015   FIBROADENOMATOUS CHANGE WITH ASSOCIATED MICROCALCIFICATIONS  . CARDIAC CATHETERIZATION    . CARPAL TUNNEL RELEASE  2010  . colonoscopy and upper endoscopy'    . COLONOSCOPY WITH PROPOFOL N/A 02/25/2016   Procedure: COLONOSCOPY WITH PROPOFOL;  Surgeon: Lollie Sails, MD;  Location: [facility] ENDOSCOPY;  Service: Endoscopy;  Laterality: N/A;  . OOPHORECTOMY    . TENDON RELEASE  2010   right foot, Dr. Vickki Muff   . TOTAL KNEE  ARTHROPLASTY Left 06/14/2016   Procedure: TOTAL KNEE ARTHROPLASTY;  Surgeon: Corky Mull, MD;  Location: [facility] ORS;  Service: Orthopedics;  Laterality: Left;    Her family history includes Alzheimer's disease in her mother; Breast cancer in her maternal aunt; Cancer in her father; Cancer (age of onset: 15) in her sister; Colon cancer in her sister; Diabetes in her brother and sister; Hodgkin's lymphoma in her sister.   Current Outpatient Medications:  .  acetaminophen (TYLENOL) 650 MG CR tablet, Take 650 mg by mouth every 8 (eight) hours as needed for pain., Disp: , Rfl:  .  albuterol (ACCUNEB) 0.63 MG/3ML nebulizer solution, Inhale into the lungs. Take 3 mLs (0.63 mg total) by nebulization every 6 (six) hours as needed for Wheezing, Disp: , Rfl:  .  albuterol (PROVENTIL HFA;VENTOLIN HFA) 108 (90 Base) MCG/ACT inhaler, Inhale 2 puffs into the lungs every 6 (six) hours as needed for wheezing or shortness of breath., Disp: 1 Inhaler, Rfl: 3 .  aspirin 81 MG tablet, Take 1 tablet by mouth daily., Disp: , Rfl:  .  Blood Glucose Monitoring Suppl (ONE TOUCH ULTRA 2) w/Device KIT, Use daily to check blood sugar for  type 2 diabetes E11.9, Disp: 1 each, Rfl: 0 .  calcium carbonate (CALCIUM 600) 600 MG TABS tablet, Take 500 mg by mouth 2 (two) times daily with a meal. , Disp: , Rfl:  .  calcium carbonate (TUMS EX) 750 MG chewable tablet, Chew 2 tablets by mouth 2 (two) times daily as needed for heartburn., Disp: , Rfl:  .  cholecalciferol (VITAMIN D) 400 units TABS tablet, Take 400 Units by mouth daily., Disp: , Rfl:  .  furosemide (LASIX) 20 MG tablet, Take 20 mg by mouth daily., Disp: , Rfl:  .  glucose blood (ONE TOUCH ULTRA TEST) test strip, Use as instructed to check blood sugar daily for type 2 diabetes E11.9, Disp: 100 each, Rfl: 4 .  levothyroxine (SYNTHROID, LEVOTHROID) 125 MCG tablet, TAKE 1 TABLET BY MOUTH ONCE DAILY BEFORE BREAKFAST, Disp: 90 tablet, Rfl: 4 .  losartan (COZAAR) 50 MG tablet, Take 1 tablet by mouth once daily, Disp: 90 tablet, Rfl: 04 .  metFORMIN (GLUCOPHAGE-XR) 500 MG 24 hr tablet, TAKE 2 TABLETS BY MOUTH IN THE MORNING, THEN 2 TABLETS BY MOUTH IN THE EVENING, Disp: 360 tablet, Rfl: 4 .  montelukast (SINGULAIR) 10 MG tablet, Take 10 mg by mouth at bedtime., Disp: , Rfl:  .  MULTIPLE VITAMIN PO, Take 1 tablet by mouth daily., Disp: , Rfl:  .  Omega-3 Fatty Acids (FISH OIL BURP-LESS) 1000 MG CAPS, Take 1 tablet by mouth 2 (two) times daily. , Disp: , Rfl:  .  pseudoephedrine-dextromethorphan-guaifenesin (ROBITUSSIN-PE) 30-10-100 MG/5ML solution, Take 10 mLs by mouth 4 (four) times daily as needed for cough., Disp: , Rfl:  .  RABEprazole (ACIPHEX) 20 MG tablet, Take 1 tablet by mouth once daily, Disp: 30 tablet, Rfl: 12 .  rosuvastatin (CRESTOR) 20 MG tablet, TAKE 1 TABLET BY MOUTH ONCE DAILY, Disp: 90 tablet, Rfl: 3 .  Tiotropium Bromide Monohydrate (SPIRIVA RESPIMAT) 1.25 MCG/ACT AERS, Inhale 1 spray into the lungs every morning., Disp: , Rfl:  .  trolamine salicylate (ASPERCREME) 10 % cream, Apply 1 application topically as needed for muscle pain., Disp: , Rfl:  .  vitamin B-12  (CYANOCOBALAMIN) 50 MCG tablet, Take 50 mcg by mouth daily., Disp: , Rfl:   Patient Care Team: Birdie Sons, MD as PCP - General (Family Medicine) Wallene Huh  E, MD as Consulting Physician (Pulmonary Disease) Anell Barr, OD (Optometry)     Objective:    Vitals: BP 122/62 (BP Location: Left Arm, Patient Position: Sitting, Cuff Size: Large)   Pulse 71   Temp (!) 96.2 F (35.7 C) (Temporal)   Resp 18   Ht 5' 7"  (1.702 m)   Wt 214 lb (97.1 kg)   SpO2 95% Comment: room air  BMI 33.52 kg/m   Physical Exam   General Appearance:    Obese female. Alert, cooperative, in no acute distress, appears stated age   Head:    Normocephalic, without obvious abnormality, atraumatic  Eyes:    PERRL, conjunctiva/corneas clear, EOM's intact, fundi    benign, both eyes  Ears:    Normal TM's and external ear canals, both ears  Nose:   Nares normal, septum midline, mucosa normal, no drainage    or sinus tenderness  Throat:   Lips, mucosa, and tongue normal; teeth and gums normal  Neck:   Supple, symmetrical, trachea midline, no adenopathy;    thyroid:  no enlargement/tenderness/nodules; no carotid   bruit or JVD  Back:     Symmetric, no curvature, ROM normal, no CVA tenderness  Lungs:     Clear to auscultation bilaterally, respirations unlabored  Chest Wall:    No tenderness or deformity   Heart:    Normal heart rate. Normal rhythm. 2/6 systolic murmur right upper sternal border and left third intercostal space  Breast Exam:    normal appearance, no masses or tenderness  Abdomen:     Soft, non-tender, bowel sounds active all four quadrants,    no masses, no organomegaly  Pelvic:    deferred  Extremities:   All extremities are intact. No cyanosis. 3+ bilateral ankle of foot edema. No erythema.   Pulses:   2+ and symmetric all extremities. Several purpuric lesions both upper extremities.   Skin:   Skin color, texture, turgor normal, no rashes or lesions  Lymph nodes:   Cervical,  supraclavicular, and axillary nodes normal  Neurologic:   CNII-XII intact, normal strength, sensation and reflexes    throughout   Screening [facility] 11/22/2018 Activities of Daily Living In your present state of health, do you have any difficulty performing the following activities: 11/22/2018  Hearing? N  Vision? Y  Comment Has an eyelid problem that is being followed up by an optometrist.  Difficulty concentrating or making decisions? N  Walking or climbing stairs? N  Dressing or bathing? N  Doing errands, shopping? N  Preparing Food and eating ? N  Using the Toilet? N  In the past six months, have you accidently leaked urine? N  Do you have problems with loss of bowel control? N  Managing your Medications? N  Managing your Finances? N  Housekeeping or managing your Housekeeping? N  Some recent data might be hidden    Fall Risk Assessment Fall Risk  11/22/2018 08/17/2018 11/16/2017 11/15/2016 12/31/2015  Falls in the past year? 0 0 0 Yes Yes  Number falls in past yr: 0 - - 1 2 or more  Injury with Fall? 0 - - No No  Follow up - - - Falls prevention discussed Falls prevention discussed     Depression Screen PHQ 2/9 Scores 11/22/2018 11/16/2017 11/15/2016 12/31/2015  PHQ - 2 Score 2 5 0 0  PHQ- 9 Score 2 8 - -    6CIT Screen 11/22/2018  What Year? 0 points  What month? 0 points  What time? 3 points  Count back from 20 0 points  Months in reverse 0 points  Repeat phrase 6 points  Total Score 9       Assessment & Plan:    Annual Physical Reviewed patient's Family Medical History Reviewed and updated list of patient's medical providers Assessment of cognitive impairment was done Assessed patient's functional ability Established a written schedule for health screening [place] Completed and Reviewed  Exercise Activities and Dietary recommendations Goals    . Exercise 3x per week (30 min per time)     Recommend to start walking 3 days a  week for at least 30 minutes at a time.     . Increase water intake     Starting 12/31/15, I will continue to drink 6-8 glasses of water a day.    . Increase water intake     Recommend cutting back on the amount of coffee and substituting with water to drink at least 4-6 glasses a day.   11/16/17- Continue trying to cut back on coffee and increase water intake to 6-8 glasses a day.        Immunization History  Administered Date(s) Administered  . Influenza, High Dose Seasonal PF 10/19/2015, 09/15/2016, 09/18/2017, 10/06/2018  . Influenza-Unspecified 02/02/2015  . Pneumococcal Conjugate-13 04/12/2013, 09/18/2017  . Pneumococcal Polysaccharide-23 11/24/2011  . Tdap 06/06/2008  . Zoster 09/11/2010  . Zoster Recombinat (Shingrix) 12/13/2017, 07/10/2018    Health Maintenance  Topic Date Due  . FOOT EXAM  11/20/1955  . OPHTHALMOLOGY EXAM  03/31/2018  . HEMOGLOBIN A1C  05/17/2018  . TETANUS/TDAP  06/07/2018  . MAMMOGRAM  01/10/2020  . DEXA SCAN  02/24/2020  . COLONOSCOPY  02/24/2021  . INFLUENZA VACCINE  Completed  . Hepatitis C Screening  Completed  . PNA vac Low Risk Adult  Completed     Discussed health benefits of physical activity, and encouraged her to engage in regular exercise appropriate for her age and condition.    ------------------------------------------------------------------------------------------------------------  1. Annual physical exam   2. Type 2 diabetes mellitus with neurological complications (HCC)  - Lipid panel (fasting) - Comprehensive metabolic panel - HgB D6U  3. Aortic atherosclerosis (HCC) Asymptomatic. Compliant with medication.  Continue aggressive risk factor modification.  On rosuvastatin  4. Morbid obesity ([place]) Diet and exercise  5. Von Willebrand's disease ([place])  - CBC  6. Intention tremor Symptoms are pretty minor at this time. Decline medication treatment for now.   7. Edema, unspecified type Discussed increasing  furosemide if electrolytes are stable.   8. Memory difficulty She does have strong family history of dementia and scores poorly on 6-CIT. She is high risk for vascular dementia. Anticipate neuro imaging studies after reviewing labs.   9. Polyneuropathy Stable.   10. Restless legs syndrome Stable  11. Hypothyroidism, unspecified type  - TSH  12. Anemia associated with nutritional deficiency  - Ferritin - Folate  13. Senile purpura ([place]) She does have von-willebrands and is on low dose ECASA. Is just a nuisance at this point.   15. Lung nodule seen on imaging study Competed SBRT per Dr. Margarito Liner, MD  [place]

## 2018-11-23 NOTE — Patient Instructions (Addendum)
.   Please review the attached list of medications and notify my office if there are any errors.   . Please bring all of your medications to every appointment so we can make sure that our medication list is the same as yours.   . Please contact your eyecare professional to schedule a routine eye exam  . Please call the [facility] [phone]) to schedule a routine screening mammogram.

## 2018-11-24 ENCOUNTER — Other Ambulatory Visit: Payer: Self-pay | Admitting: Family Medicine

## 2018-11-24 DIAGNOSIS — R413 Other amnesia: Secondary | ICD-10-CM

## 2018-11-24 LAB — COMPREHENSIVE METABOLIC PANEL
ALT: 13 IU/L (ref 0–32)
AST: 15 IU/L (ref 0–40)
Albumin/Globulin Ratio: 1.7 (ref 1.2–2.2)
Albumin: 3.9 g/dL (ref 3.7–4.7)
Alkaline Phosphatase: 51 IU/L (ref 39–117)
BUN/Creatinine Ratio: 19 (ref 12–28)
BUN: 9 mg/dL (ref 8–27)
Bilirubin Total: 0.3 mg/dL (ref 0.0–1.2)
CO2: 28 mmol/L (ref 20–29)
Calcium: 9.4 mg/dL (ref 8.7–10.3)
Chloride: 100 mmol/L (ref 96–106)
Creatinine, Ser: 0.47 mg/dL — ABNORMAL LOW (ref 0.57–1.00)
GFR calc Af Amer: 114 mL/min/{1.73_m2} (ref >59)
GFR calc non Af Amer: 99 mL/min/{1.73_m2} (ref >59)
Globulin, Total: 2.3 g/dL (ref 1.5–4.5)
Glucose: 96 mg/dL (ref 65–99)
Potassium: 3.7 mmol/L (ref 3.5–5.2)
Sodium: 141 mmol/L (ref 134–144)
Total Protein: 6.2 g/dL (ref 6.0–8.5)

## 2018-11-24 LAB — HEMOGLOBIN A1C
Est. average glucose Bld gHb Est-mCnc: 134 mg/dL
Hgb A1c MFr Bld: 6.3 % — ABNORMAL HIGH (ref 4.8–5.6)

## 2018-11-24 LAB — TSH: TSH: 0.585 u[IU]/mL (ref 0.450–4.500)

## 2018-11-24 LAB — CBC
Hematocrit: 28.5 % — ABNORMAL LOW (ref 34.0–46.6)
Hemoglobin: 8.5 g/dL — ABNORMAL LOW (ref 11.1–15.9)
MCH: 24 pg — ABNORMAL LOW (ref 26.6–33.0)
MCHC: 29.8 g/dL — ABNORMAL LOW (ref 31.5–35.7)
MCV: 81 fL (ref 79–97)
Platelets: 215 10*3/uL (ref 150–450)
RBC: 3.54 x10E6/uL — ABNORMAL LOW (ref 3.77–5.28)
RDW: 15.5 % — ABNORMAL HIGH (ref 11.7–15.4)
WBC: 6.7 10*3/uL (ref 3.4–10.8)

## 2018-11-24 LAB — LIPID PANEL
Chol/HDL Ratio: 2.1 ratio (ref 0.0–4.4)
Cholesterol, Total: 145 mg/dL (ref 100–199)
HDL: 70 mg/dL (ref >39)
LDL Chol Calc (NIH): 64 mg/dL (ref 0–99)
Triglycerides: 53 mg/dL (ref 0–149)
VLDL Cholesterol Cal: 11 mg/dL (ref 5–40)

## 2018-11-24 LAB — FOLATE: Folate: >20 ng/mL (ref >3.0)

## 2018-11-24 LAB — FERRITIN: Ferritin: 12 ng/mL — ABNORMAL LOW (ref 15–150)

## 2018-11-26 ENCOUNTER — Other Ambulatory Visit: Payer: Self-pay | Admitting: Family Medicine

## 2018-11-26 ENCOUNTER — Telehealth: Payer: Self-pay | Admitting: Family Medicine

## 2018-11-26 ENCOUNTER — Telehealth: Payer: Self-pay

## 2018-11-26 ENCOUNTER — Telehealth: Payer: Self-pay | Admitting: *Deleted

## 2018-11-26 DIAGNOSIS — Z1231 Encounter for screening mammogram for malignant neoplasm of breast: Secondary | ICD-10-CM

## 2018-11-26 NOTE — Telephone Encounter (Signed)
Pt called in for lab results.Given message from Dr. Caryn Section dated 11/24/2018 at 5:25 PM. She verbalized understanding to start the OTC iron.  Needed clarification of the sentence  OC-lyte to see if losing blood from GI tract.  I sent this message to the office of Dr. Lelon Huh.

## 2018-11-26 NOTE — Telephone Encounter (Signed)
Pt calling to get lab results.  Copied from [place] [id number]. Topic: Quick Communication - Lab Results (Clinic Use ONLY) >> Nov 26, 2018 11:53 AM Randal Buba, CMA wrote: Called patient to inform them of recent lab results. When patient returns call, triage nurse may disclose results.

## 2018-11-26 NOTE — Telephone Encounter (Signed)
Tried calling patient. Left message to call back. 

## 2018-11-26 NOTE — Telephone Encounter (Signed)
I called and spoke with patient advising her that she needs to pick up OC Lyte take home kit to check for blood loss from GI tract. Kit left at front desk for pick up.

## 2018-11-26 NOTE — Telephone Encounter (Signed)
-----   Message from Birdie Sons, MD sent at 11/24/2018  5:25 PM EST ----- Labs show that she is very iron deficient and anemic. She needs to start taking OTC iron sulfate 325mg  every day. Recommend taking it at night before bed.  Needs to do OC- Lyte to see if losing blood from GI tract.  A1c is good at 6.3 Rest of labs are normal. Nothing that would explain difficulty with memory. Recommend proceed with CT brain for evaluation of memory change. Order has been placed

## 2018-11-27 DIAGNOSIS — J449 Chronic obstructive pulmonary disease, unspecified: Secondary | ICD-10-CM | POA: Diagnosis not present

## 2018-11-29 ENCOUNTER — Ambulatory Visit: Payer: Self-pay | Admitting: Family Medicine

## 2018-11-29 NOTE — Telephone Encounter (Signed)
Out going call to Patient .  Patient complains of bilateral leg swelling down to feet.   For a week.  Rated moderate to severe. Patient states that she has been on O2  For a while for SOB.  Patient states that's the last time she was in the office Dr.  Caryn Section mention he may   Have to increase her Lasix medication. Patient states since elevating her legs they feel better than before. Recommended Pt.  Go to Urgent Care or ED.  Pt.  Does not whish to go tho either location.  Patient is requesting a return  Call from provider.   Pharmacy [facility] on [address].             Reason for Disposition . [1] Swelling is painful to touch AND [2] fever  Answer Assessment - Initial Assessment Questions 1. ONSET: "When did the swelling start?" (e.g., minutes, hours, days)   A week 2. LOCATION: "What part of the leg is swollen?"  "Are both legs swollen or just one leg?"     Toe up knee both legs 3. SEVERITY: "How bad is the swelling?" (e.g., localized; mild, moderate, severe)  - Localized - small area of swelling localized to one leg  - MILD pedal edema - swelling limited to foot and ankle, pitting edema < 1/4 inch (6 mm) deep, rest and elevation eliminate most or all swelling  - MODERATE edema - swelling of lower leg to knee, pitting edema > 1/4 inch (6 mm) deep, rest and elevation only partially reduce swelling  - SEVERE edema - swelling extends above knee, facial or hand swelling present     moderate 4. REDNESS: "Does the swelling look red or infected?"    Yes,  Looks like a rash 5. PAIN: "Is the swelling painful to touch?" If so, ask: "How painful is it?"   (Scale 1-10; mild, moderate or severe)     Fluid,  slick severe at times 6. FEVER: "Do you have a fever?" If so, ask: "What is it, how was it measured, and when did it start?"      denies 7. CAUSE: "What do you think is causing the leg swelling?"     *No Answer* 8. MEDICAL HISTORY: "Do you have a history of heart failure, kidney  disease, liver failure, or cancer?"     denies 9. RECURRENT SYMPTOM: "Have you had leg swelling before?" If so, ask: "When was the last time?" "What happened that time?"     yes 10. OTHER SYMPTOMS: "Do you have any other symptoms?" (e.g., chest pain, difficulty breathing)       SOB 11. PREGNANCY: "Is there any chance you are pregnant?" "When was your last menstrual period?"      na  Protocols used: LEG SWELLING AND EDEMA-A-AH

## 2018-11-30 ENCOUNTER — Telehealth: Payer: Self-pay | Admitting: Family Medicine

## 2018-11-30 ENCOUNTER — Other Ambulatory Visit: Payer: Self-pay | Admitting: Family Medicine

## 2018-11-30 DIAGNOSIS — D539 Nutritional anemia, unspecified: Secondary | ICD-10-CM

## 2018-11-30 LAB — IFOBT (OCCULT BLOOD): IFOBT: NEGATIVE

## 2018-11-30 NOTE — Telephone Encounter (Signed)
LMTCB okay for [facility] to advise patient and schedule appointment.

## 2018-11-30 NOTE — Telephone Encounter (Signed)
Results and recommendations per notes of Dr. Caryn Section relayed to the patient. Understanding verbalized. Call transferred to office to schedule next available appt for Dr. Caryn Section in approximately 3 weeks.

## 2018-11-30 NOTE — Telephone Encounter (Signed)
Patient called wanting the result of her stool sample she had her daughter bring in yesterday.  She also states that she has never heard from the office about the swelling in her legs. Per chart patient was triage for lower leg edema yesterday and refused UC and ER. Call transferred to office [facility] for follow-up.

## 2018-11-30 NOTE — Telephone Encounter (Signed)
Stool test was negative.  Swelling is probably related to anemia and iron deficiency, so it's important that she take the iron supplement It's ok to double up and take TWO furosemide daily for the next week. Need to follow up in 3 weeks to check on iron levels.

## 2018-11-30 NOTE — Telephone Encounter (Signed)
Pt calling back regarding:  Stool  Lab results  Legs being swollen  Imaging - supposed to be done before Jan 5th.   Please call pt back at:  [phone]  Thanks, [facility]

## 2018-11-30 NOTE — Telephone Encounter (Signed)
Dr. Caryn Section, patient called back today requesting an appointment to see you for leg swelling or advice. Patient was advised yesterday by the [facility] triage nurse to go to the ER or Urgent care. Patient refused to go and says she would rather come in the office to see you. Patient complains of constant swelling in her legs. She had this when she was seen in the office for her CPE on 11/23/2018.  Patient says the swelling slightly improves when she keeps her legs elevated. She currently takes 20mg  of furosemide daily.  She also has some shortness of breath which is unchanged from her baseline. Patient wants to know if she should increase the dose of furosemide? She states you mentioned increasing the dose during her last office visit . Please advise.

## 2018-11-30 NOTE — Telephone Encounter (Signed)
Message from [facility]::: Patient called wanting the result of her stool sample she had her daughter bring in yesterday.  She also states that she has never heard from the office about the swelling in her legs. Per chart patient was triage for lower leg edema yesterday and refused UC and ER. Please advise results.

## 2018-12-07 ENCOUNTER — Ambulatory Visit: Payer: Self-pay | Admitting: *Deleted

## 2018-12-07 NOTE — Telephone Encounter (Signed)
Pt states that her feet were swollen and she did what the doctor told her to do. Pt states that she wants to know if this will continue happening or if they won't swell anymore.

## 2018-12-07 NOTE — Telephone Encounter (Signed)
Returned call to patient regarding swelling in her feet and legs. She stated that she had seen her provider 2 weeks ago and was prescribed to double up on her lasix for a week. So she did that and still has swelling and some redness along her ankles. She denies shortness of breath (she is on oxygen), no chest pain. Per protocol she needs to be seen within 3 days. Appointment scheduled for Monday. Advised for worsening symptoms to go to the ED. She voiced understanding. Reason for Disposition . [1] MILD swelling of both ankles (i.e., pedal edema) AND [2] new onset or worsening  Answer Assessment - Initial Assessment Questions 1. ONSET: "When did the swelling start?" (e.g., minutes, hours, days)     Over 2 weeks 2. LOCATION: "What part of the leg is swollen?"  "Are both legs swollen or just one leg?"     From knees down 3. SEVERITY: "How bad is the swelling?" (e.g., localized; mild, moderate, severe)  - Localized - small area of swelling localized to one leg  - MILD pedal edema - swelling limited to foot and ankle, pitting edema < 1/4 inch (6 mm) deep, rest and elevation eliminate most or all swelling  - MODERATE edema - swelling of lower leg to knee, pitting edema > 1/4 inch (6 mm) deep, rest and elevation only partially reduce swelling  - SEVERE edema - swelling extends above knee, facial or hand swelling present      Mild to moderate 4. REDNESS: "Does the swelling look red or infected?"     Redness close to the ankle 5. PAIN: "Is the swelling painful to touch?" If so, ask: "How painful is it?"   (Scale 1-10; mild, moderate or severe)     Pain # 8 6. FEVER: "Do you have a fever?" If so, ask: "What is it, how was it measured, and when did it start?"      no 7. CAUSE: "What do you think is causing the leg swelling?"     Not sure 8. MEDICAL HISTORY: "Do you have a history of heart failure, kidney disease, liver failure, or cancer?"     Cancer and had 5 weeks of radiation 9. RECURRENT SYMPTOM:  "Have you had leg swelling before?" If so, ask: "When was the last time?" "What happened that time?"     No  10. OTHER SYMPTOMS: "Do you have any other symptoms?" (e.g., chest pain, difficulty breathing)       On oxygen at 3.5 liters 11. PREGNANCY: "Is there any chance you are pregnant?" "When was your last menstrual period?"       n/a  Protocols used: LEG SWELLING AND EDEMA-A-AH

## 2018-12-10 ENCOUNTER — Ambulatory Visit
Admission: RE | Admit: 2018-12-10 | Discharge: 2018-12-10 | Disposition: A | Discharge status: Home / Self Care | Payer: PPO | Source: Ambulatory Visit | Attending: Family Medicine | Admitting: Family Medicine

## 2018-12-10 ENCOUNTER — Ambulatory Visit (INDEPENDENT_AMBULATORY_CARE_PROVIDER_SITE_OTHER): Payer: PPO | Admitting: Family Medicine

## 2018-12-10 ENCOUNTER — Encounter: Payer: Self-pay | Admitting: Family Medicine

## 2018-12-10 ENCOUNTER — Other Ambulatory Visit: Payer: Self-pay

## 2018-12-10 VITALS — BP 126/74 | HR 83 | Temp 96.6°F | Resp 16 | Wt 209.6 lb

## 2018-12-10 DIAGNOSIS — R519 Headache, unspecified: Secondary | ICD-10-CM | POA: Diagnosis not present

## 2018-12-10 DIAGNOSIS — R600 Localized edema: Secondary | ICD-10-CM | POA: Diagnosis not present

## 2018-12-10 DIAGNOSIS — L03116 Cellulitis of left lower limb: Secondary | ICD-10-CM | POA: Diagnosis not present

## 2018-12-10 DIAGNOSIS — D692 Other nonthrombocytopenic purpura: Secondary | ICD-10-CM

## 2018-12-10 DIAGNOSIS — R413 Other amnesia: Secondary | ICD-10-CM | POA: Diagnosis not present

## 2018-12-10 DIAGNOSIS — C3490 Malignant neoplasm of unspecified part of unspecified bronchus or lung: Secondary | ICD-10-CM | POA: Diagnosis not present

## 2018-12-10 MED ORDER — SULFAMETHOXAZOLE-TRIMETHOPRIM 800-160 MG PO TABS: 1.0000 | ORAL_TABLET | Freq: Two times a day (BID) | ORAL | 0 refills | Status: AC

## 2018-12-10 NOTE — Progress Notes (Signed)
Patient: Samantha Henry Female    DOB: November 26, 1945   73 y.o.   MRN: [id number] Visit Date: 12/10/2018  Today's Provider: Lavon Paganini, MD   Chief Complaint  Patient presents with  . Leg Swelling  . Bleeding/Bruising   Subjective:     Leg Pain  The incident occurred more than 1 week ago. There was no injury mechanism. The pain is present in the left leg and right leg. The quality of the pain is described as cramping. The pain has been constant since onset. Associated symptoms include muscle weakness. Pertinent negatives include no inability to bear weight, loss of motion, loss of sensation, numbness or tingling. She reports no foreign bodies present. The symptoms are aggravated by movement and weight bearing. She has tried elevation for the symptoms. The treatment provided mild relief.  Patients daughter states that she was seen by Dr. Caryn Section a week ago and was put on Lasix to help with swelling in leg but patient reports no improvement.Patient reports for the past 2 weeks she has noticed bruising on her arms but is unsure what has caused bruising.   Leg swelling has worsened further and L leg has increased in size even more.  She has developed a rash and redness of legs as well.  Allergies  Allergen Reactions  . Aspirin     AVOIDs due to history of [place]  . Gabapentin     dizzy  . Lyrica [Pregabalin] Other (See Comments)    Dizziness  . Tetracycline Rash     Current Outpatient Medications:  .  acetaminophen (TYLENOL) 650 MG CR tablet, Take 650 mg by mouth every 8 (eight) hours as needed for pain., Disp: , Rfl:  .  albuterol (ACCUNEB) 0.63 MG/3ML nebulizer solution, Inhale into the lungs. Take 3 mLs (0.63 mg total) by nebulization every 6 (six) hours as needed for Wheezing, Disp: , Rfl:  .  albuterol (PROVENTIL HFA;VENTOLIN HFA) 108 (90 Base) MCG/ACT inhaler, Inhale 2 puffs into the lungs every 6 (six) hours as needed for wheezing or shortness of breath.,  Disp: 1 Inhaler, Rfl: 3 .  aspirin 81 MG tablet, Take 1 tablet by mouth daily., Disp: , Rfl:  .  Blood Glucose Monitoring Suppl (ONE TOUCH ULTRA 2) w/Device KIT, Use daily to check blood sugar for type 2 diabetes E11.9, Disp: 1 each, Rfl: 0 .  calcium carbonate (CALCIUM 600) 600 MG TABS tablet, Take 500 mg by mouth 2 (two) times daily with a meal. , Disp: , Rfl:  .  calcium carbonate (TUMS EX) 750 MG chewable tablet, Chew 2 tablets by mouth 2 (two) times daily as needed for heartburn., Disp: , Rfl:  .  cholecalciferol (VITAMIN D) 400 units TABS tablet, Take 400 Units by mouth daily., Disp: , Rfl:  .  furosemide (LASIX) 20 MG tablet, Take 20 mg by mouth daily., Disp: , Rfl:  .  glucose blood (ONE TOUCH ULTRA TEST) test strip, Use as instructed to check blood sugar daily for type 2 diabetes E11.9, Disp: 100 each, Rfl: 4 .  levothyroxine (SYNTHROID, LEVOTHROID) 125 MCG tablet, TAKE 1 TABLET BY MOUTH ONCE DAILY BEFORE BREAKFAST, Disp: 90 tablet, Rfl: 4 .  losartan (COZAAR) 50 MG tablet, Take 1 tablet by mouth once daily, Disp: 90 tablet, Rfl: 04 .  metFORMIN (GLUCOPHAGE-XR) 500 MG 24 hr tablet, TAKE 2 TABLETS BY MOUTH IN THE MORNING, THEN 2 TABLETS BY MOUTH IN THE EVENING, Disp: 360 tablet, Rfl: 4 .  montelukast (SINGULAIR) 10 MG tablet, Take 10 mg by mouth at bedtime., Disp: , Rfl:  .  MULTIPLE VITAMIN PO, Take 1 tablet by mouth daily., Disp: , Rfl:  .  Omega-3 Fatty Acids (FISH OIL BURP-LESS) 1000 MG CAPS, Take 1 tablet by mouth 2 (two) times daily. , Disp: , Rfl:  .  RABEprazole (ACIPHEX) 20 MG tablet, Take 1 tablet by mouth once daily, Disp: 30 tablet, Rfl: 12 .  rosuvastatin (CRESTOR) 20 MG tablet, TAKE 1 TABLET BY MOUTH ONCE DAILY, Disp: 90 tablet, Rfl: 3 .  Tiotropium Bromide Monohydrate (SPIRIVA RESPIMAT) 1.25 MCG/ACT AERS, Inhale 1 spray into the lungs every morning., Disp: , Rfl:  .  trolamine salicylate (ASPERCREME) 10 % cream, Apply 1 application topically as needed for muscle pain., Disp: ,  Rfl:  .  vitamin B-12 (CYANOCOBALAMIN) 50 MCG tablet, Take 50 mcg by mouth daily., Disp: , Rfl:   Review of Systems  Constitutional: Positive for fatigue. Negative for activity change, appetite change, chills, diaphoresis and fever.  Respiratory: Negative.   Cardiovascular: Positive for leg swelling. Negative for chest pain and palpitations.  Genitourinary: Negative.   Musculoskeletal: Positive for gait problem and myalgias.  Skin: Positive for color change, rash and wound.  Neurological: Negative for tingling, syncope, weakness, light-headedness and numbness.  Hematological: Bruises/bleeds easily.  Psychiatric/Behavioral: Negative.     Social History   Tobacco Use  . Smoking status: Former Smoker    Packs/day: 1.50    Years: 48.00    Pack years: 72.00    Types: Cigarettes    Quit date: 01/11/2007    Years since quitting: 11.9  . Smokeless tobacco: Never Used  . Tobacco comment: smoked up to 2 ppd since 73 yo. quit in 2009  Substance Use Topics  . Alcohol use: No    Alcohol/week: 0.0 standard drinks      Objective:   BP 126/74   Pulse 83   Temp (!) 96.6 F (35.9 C) (Oral)   Resp 16   Wt 209 lb 9.6 oz (95.1 kg)   SpO2 97%   BMI 32.83 kg/m  Vitals:   12/10/18 1347  BP: 126/74  Pulse: 83  Resp: 16  Temp: (!) 96.6 F (35.9 C)  TempSrc: Oral  SpO2: 97%  Weight: 209 lb 9.6 oz (95.1 kg)  Body mass index is 32.83 kg/m.   Physical Exam Vitals signs reviewed.  Constitutional:      General: She is not in acute distress.    Appearance: Normal appearance. She is well-developed. She is not diaphoretic.  HENT:     Head: Normocephalic and atraumatic.  Eyes:     General: No scleral icterus.    Conjunctiva/sclera: Conjunctivae normal.  Neck:     Thyroid: No thyromegaly.  Cardiovascular:     Rate and Rhythm: Normal rate and regular rhythm.     Pulses: Normal pulses.     Heart sounds: Murmur present.  Pulmonary:     Effort: Pulmonary effort is normal. No  respiratory distress.     Breath sounds: Normal breath sounds. No wheezing, rhonchi or rales.  Musculoskeletal:     Right lower leg: Edema present.     Left lower leg: Edema present.     Comments: 2+ bilateral LE edema, with L>R and significant TTP.  Notable erythema of LLE. Chronic venous stasis changes, without ulceration present currently.  Skin:    General: Skin is warm and dry.     Findings: Erythema present.  Comments: + Senile purpura of bilateral UEs  Neurological:     Mental Status: She is alert and oriented to person, place, and time. Mental status is at baseline.  Psychiatric:        Mood and Affect: Mood normal.        Behavior: Behavior normal.      No results found for any visits on 12/10/18.     Assessment & Plan   1. Lower extremity edema -Longstanding problem, with recent worsening -She seems to have some chronic venous stasis that has been unimproved with Lasix -Given the size discrepancy in her calves and the worst tenderness of her left lower extremity, will get venous Dopplers to rule out DVT -Concern for cellulitis of the left lower extremity given exquisite tenderness to palpation and erythema, so we will treat as below -Advised elevation -After resolution of cellulitis, should consider compression stockings -If persists, may want to consider echo to evaluate for any cardiac etiology - US Venous Img Lower Bilateral; Future  2. Cellulitis of left lower extremity -New problem -Will treat with Bactrim x7 days -Discussed return precautions -No systemic symptoms  3. Senile purpura ([place]) - reassurance given about benign nature - may be contributed by vWF   Meds ordered this encounter  Medications  . sulfamethoxazole-trimethoprim (BACTRIM DS) 800-160 MG tablet    Sig: Take 1 tablet by mouth 2 (two) times daily for 7 days.    Dispense:  14 tablet    Refill:  0     Return if symptoms worsen or fail to improve.   The entirety of the information  documented in the History of Present Illness, Review of Systems and Physical Exam were personally obtained by me. Portions of this information were initially documented by Jennings Books, CMA and reviewed by me for thoroughness and accuracy.    , Dionne Bucy, MD MPH [facility]

## 2018-12-10 NOTE — Telephone Encounter (Signed)
From [facility] 

## 2018-12-10 NOTE — Patient Instructions (Signed)

## 2018-12-11 ENCOUNTER — Telehealth: Payer: Self-pay

## 2018-12-11 ENCOUNTER — Other Ambulatory Visit: Payer: Self-pay | Admitting: Family Medicine

## 2018-12-11 DIAGNOSIS — I779 Disorder of arteries and arterioles, unspecified: Secondary | ICD-10-CM

## 2018-12-11 DIAGNOSIS — I671 Cerebral aneurysm, nonruptured: Secondary | ICD-10-CM | POA: Insufficient documentation

## 2018-12-11 NOTE — Telephone Encounter (Signed)
Order placed

## 2018-12-11 NOTE — Telephone Encounter (Signed)
Pt is requesting CB from regarding her CT results and next steps. Stated she was supposed to receive a callback today. Please advise.

## 2018-12-11 NOTE — Telephone Encounter (Signed)
Patient advised. She agrees to proceed with MRA. Unsure which reason for exam to choose.

## 2018-12-11 NOTE — Telephone Encounter (Signed)
-----   Message from Birdie Sons, MD sent at 12/11/2018  8:27 AM EST ----- CT or brain is normal, but there is an enlarged carotid artery that needs further evaluation. This artery could eventually cause a stroke. Needs to order MRA head (without contrast) to see of this will need any treatment.

## 2018-12-11 NOTE — Telephone Encounter (Signed)
From [facility] 

## 2018-12-12 NOTE — Telephone Encounter (Addendum)
Pt is worried about this CT scan, and would like a call back asap. Dr Caryn Section is out until Monday, and pt had seen Dr B for this issue.  Please call

## 2018-12-12 NOTE — Telephone Encounter (Signed)
Please review

## 2018-12-13 NOTE — Telephone Encounter (Signed)
Pt advised.   Thanks,   -Laura  

## 2018-12-13 NOTE — Telephone Encounter (Signed)
Pt calling back again and is very concerned that this is something bad.  Unable to give any information, and pt would like a call back asap to explain what is going on?

## 2018-12-13 NOTE — Telephone Encounter (Signed)
I did not see the patient for this problem.  As Dr Caryn Section described, the enlarged carotid artery needs a higher sensitivity image (the MRA brain, which has already been ordered).  This needs to be approved by her insurance and then it will be scheduled.  Her Brain appeared normal on CT scan.

## 2018-12-24 ENCOUNTER — Other Ambulatory Visit: Payer: Self-pay

## 2018-12-24 ENCOUNTER — Ambulatory Visit
Admission: RE | Admit: 2018-12-24 | Discharge: 2018-12-24 | Disposition: A | Discharge status: Home / Self Care | Payer: PPO | Source: Ambulatory Visit | Attending: Family Medicine | Admitting: Family Medicine

## 2018-12-24 DIAGNOSIS — I720 Aneurysm of carotid artery: Secondary | ICD-10-CM | POA: Insufficient documentation

## 2018-12-24 DIAGNOSIS — I779 Disorder of arteries and arterioles, unspecified: Secondary | ICD-10-CM

## 2018-12-24 DIAGNOSIS — I6621 Occlusion and stenosis of right posterior cerebral artery: Secondary | ICD-10-CM | POA: Diagnosis not present

## 2018-12-24 DIAGNOSIS — I6521 Occlusion and stenosis of right carotid artery: Secondary | ICD-10-CM | POA: Diagnosis not present

## 2018-12-25 ENCOUNTER — Other Ambulatory Visit: Payer: Self-pay | Admitting: Family Medicine

## 2018-12-25 ENCOUNTER — Encounter: Payer: Self-pay | Admitting: Family Medicine

## 2018-12-25 ENCOUNTER — Telehealth: Payer: Self-pay

## 2018-12-25 DIAGNOSIS — I671 Cerebral aneurysm, nonruptured: Secondary | ICD-10-CM

## 2018-12-25 DIAGNOSIS — I679 Cerebrovascular disease, unspecified: Secondary | ICD-10-CM | POA: Insufficient documentation

## 2018-12-25 DIAGNOSIS — I6523 Occlusion and stenosis of bilateral carotid arteries: Secondary | ICD-10-CM | POA: Insufficient documentation

## 2018-12-25 MED ORDER — DONEPEZIL HCL 5 MG PO TABS: 5.0000 mg | ORAL_TABLET | Freq: Every day | ORAL | 5 refills | Status: DC

## 2018-12-25 NOTE — Telephone Encounter (Signed)
Spoke with patients and her daughter Samantha Henry in regards to Angiogram report , daughter states that patient is compliant with starting Aricept and ask that prescription be sent to [facility] on [address]. KW

## 2018-12-25 NOTE — Telephone Encounter (Signed)
-----   Message from Birdie Sons, MD sent at 12/25/2018  1:38 PM EST ----- ALSO, this finding probably unrelated to her trouble with memory. If she likes I can send in prescription for Aricept to see if it helps with memory. Let me know

## 2018-12-27 DIAGNOSIS — J449 Chronic obstructive pulmonary disease, unspecified: Secondary | ICD-10-CM | POA: Diagnosis not present

## 2019-01-01 ENCOUNTER — Encounter: Payer: Self-pay | Admitting: Family Medicine

## 2019-01-01 ENCOUNTER — Ambulatory Visit (INDEPENDENT_AMBULATORY_CARE_PROVIDER_SITE_OTHER): Payer: PPO | Admitting: Family Medicine

## 2019-01-01 ENCOUNTER — Other Ambulatory Visit: Payer: Self-pay

## 2019-01-01 VITALS — BP 118/63 | HR 89 | Temp 96.9°F | Wt 202.4 lb

## 2019-01-01 DIAGNOSIS — K219 Gastro-esophageal reflux disease without esophagitis: Secondary | ICD-10-CM

## 2019-01-01 DIAGNOSIS — D509 Iron deficiency anemia, unspecified: Secondary | ICD-10-CM | POA: Diagnosis not present

## 2019-01-01 DIAGNOSIS — R1013 Epigastric pain: Secondary | ICD-10-CM

## 2019-01-01 DIAGNOSIS — R413 Other amnesia: Secondary | ICD-10-CM

## 2019-01-01 DIAGNOSIS — R609 Edema, unspecified: Secondary | ICD-10-CM | POA: Diagnosis not present

## 2019-01-01 DIAGNOSIS — F0390 Unspecified dementia without behavioral disturbance: Secondary | ICD-10-CM | POA: Insufficient documentation

## 2019-01-01 NOTE — Progress Notes (Signed)
Patient: Samantha Henry Female    DOB: 10-11-1945   73 y.o.   MRN: [id number] Visit Date: 01/01/2019  Today's Provider: Lelon Huh, MD   Chief Complaint  Patient presents with  . Anemia  . Leg Swelling   Subjective:     HPI  Follow up for Iron Deficiency Anemia:  The patient was last seen for this 5 weeks ago. Changes made at last visit include starting OTC iron sulfate 367m daily.  She reports good compliance with treatment. She feels that condition is Improved. She is not having side effects.  She does have a history of diverticulosis and GERD for which she takes Aciphex every day. States she has been having some vague mid abdominal pains for several weeks. Also reports she has had occasional episodes of dark stool since lat visit, but has been taking iron supplements consistently.  ------------------------------------------------------------------------------------   Follow up for Leg swelling:  The patient was last seen for cellulitis of left lower extremity and edema  3 weeks ago (seen by Dr. BBrita Romp Changes made at last visit include starting 7 day course of Bactrim.  She reports good compliance with treatment. She feels that condition is Improved. Patient reports leg swelling and some redness.  She is not having side effects.   ------------------------------------------------------------------------------------ She also had neuroimaging since last visit due to problems with memory and incidentally found to have internal carotid aneurysm and cerebral artery stenosis. She is scheduled to see Dr. DLucky Cowboynext week. She was prescribed 536mdonepezil which is taking and tolerating well, but has not yet noticed any improvement in her memory.    Allergies  Allergen Reactions  . Aspirin     AVOIDs due to history of Von Willebrands  . Gabapentin     dizzy  . Lyrica [Pregabalin] Other (See Comments)    Dizziness  . Tetracycline Rash     Current  Outpatient Medications:  .  acetaminophen (TYLENOL) 650 MG CR tablet, Take 650 mg by mouth every 8 (eight) hours as needed for pain., Disp: , Rfl:  .  albuterol (ACCUNEB) 0.63 MG/3ML nebulizer solution, Inhale into the lungs. Take 3 mLs (0.63 mg total) by nebulization every 6 (six) hours as needed for Wheezing, Disp: , Rfl:  .  albuterol (PROVENTIL HFA;VENTOLIN HFA) 108 (90 Base) MCG/ACT inhaler, Inhale 2 puffs into the lungs every 6 (six) hours as needed for wheezing or shortness of breath., Disp: 1 Inhaler, Rfl: 3 .  aspirin 81 MG tablet, Take 1 tablet by mouth daily., Disp: , Rfl:  .  Blood Glucose Monitoring Suppl (ONE TOUCH ULTRA 2) w/Device KIT, Use daily to check blood sugar for type 2 diabetes E11.9, Disp: 1 each, Rfl: 0 .  calcium carbonate (CALCIUM 600) 600 MG TABS tablet, Take 500 mg by mouth 2 (two) times daily with a meal. , Disp: , Rfl:  .  calcium carbonate (TUMS EX) 750 MG chewable tablet, Chew 2 tablets by mouth 2 (two) times daily as needed for heartburn., Disp: , Rfl:  .  cholecalciferol (VITAMIN D) 400 units TABS tablet, Take 400 Units by mouth daily., Disp: , Rfl:  .  donepezil (ARICEPT) 5 MG tablet, Take 1 tablet (5 mg total) by mouth at bedtime., Disp: 30 tablet, Rfl: 5 .  furosemide (LASIX) 20 MG tablet, Take 40 mg by mouth daily at 2 PM. , Disp: , Rfl:  .  glucose blood (ONE TOUCH ULTRA TEST) test strip, Use as instructed to  check blood sugar daily for type 2 diabetes E11.9, Disp: 100 each, Rfl: 4 .  levothyroxine (SYNTHROID, LEVOTHROID) 125 MCG tablet, TAKE 1 TABLET BY MOUTH ONCE DAILY BEFORE BREAKFAST, Disp: 90 tablet, Rfl: 4 .  losartan (COZAAR) 50 MG tablet, Take 1 tablet by mouth once daily, Disp: 90 tablet, Rfl: 04 .  metFORMIN (GLUCOPHAGE-XR) 500 MG 24 hr tablet, TAKE 2 TABLETS BY MOUTH IN THE MORNING, THEN 2 TABLETS BY MOUTH IN THE EVENING, Disp: 360 tablet, Rfl: 4 .  montelukast (SINGULAIR) 10 MG tablet, Take 10 mg by mouth at bedtime., Disp: , Rfl:  .  MULTIPLE  VITAMIN PO, Take 1 tablet by mouth daily., Disp: , Rfl:  .  Omega-3 Fatty Acids (FISH OIL BURP-LESS) 1000 MG CAPS, Take 1 tablet by mouth 2 (two) times daily. , Disp: , Rfl:  .  OXYGEN, Inhale into the lungs., Disp: , Rfl:  .  RABEprazole (ACIPHEX) 20 MG tablet, Take 1 tablet by mouth once daily, Disp: 30 tablet, Rfl: 12 .  rosuvastatin (CRESTOR) 20 MG tablet, TAKE 1 TABLET BY MOUTH ONCE DAILY, Disp: 90 tablet, Rfl: 3 .  Tiotropium Bromide Monohydrate (SPIRIVA RESPIMAT) 1.25 MCG/ACT AERS, Inhale 1 spray into the lungs every morning., Disp: , Rfl:  .  trolamine salicylate (ASPERCREME) 10 % cream, Apply 1 application topically as needed for muscle pain., Disp: , Rfl:  .  vitamin B-12 (CYANOCOBALAMIN) 50 MCG tablet, Take 50 mcg by mouth daily., Disp: , Rfl:   Review of Systems  Constitutional: Negative for appetite change, chills, fatigue and fever.  Respiratory: Negative for chest tightness and shortness of breath.   Cardiovascular: Positive for leg swelling. Negative for chest pain and palpitations.  Gastrointestinal: Negative for abdominal pain, nausea and vomiting.  Neurological: Negative for dizziness and weakness.    Social History   Tobacco Use  . Smoking status: Former Smoker    Packs/day: 1.50    Years: 48.00    Pack years: 72.00    Types: Cigarettes    Quit date: 01/11/2007    Years since quitting: 11.9  . Smokeless tobacco: Never Used  . Tobacco comment: smoked up to 2 ppd since 73 yo. quit in 2009  Substance Use Topics  . Alcohol use: No    Alcohol/week: 0.0 standard drinks      Objective:   BP 118/63 (BP Location: Right Arm, Patient Position: Sitting, Cuff Size: Large)   Pulse 89   Temp (!) 96.9 F (36.1 C) (Temporal)   Wt 202 lb 6.4 oz (91.8 kg)   SpO2 99%   BMI 31.70 kg/m  Vitals:   01/01/19 1442  BP: 118/63  Pulse: 89  Temp: (!) 96.9 F (36.1 C)  TempSrc: Temporal  SpO2: 99%  Weight: 202 lb 6.4 oz (91.8 kg)  Body mass index is 31.7 kg/m.   Physical  Exam   General Appearance:    Obese female in no acute distress  Eyes:    PERRL, conjunctiva/corneas clear, EOM's intact       Lungs:     Clear to auscultation bilaterally, respirations unlabored  Heart:    Normal heart rate. Normal rhythm.  2/6 systolic murmur  MS:   All extremities are intact. 1+ bipedal edema  Neurologic:   Awake, alert, oriented x 3. No apparent focal neurological           defect.            Assessment & Plan    1. Iron deficiency anemia, unspecified  iron deficiency anemia type Now on 65m iron sulfate daily. OC-Lyte was negative, but she has had  Some melena. This may be secondary to iron supplements, but she is also having some abdominal pains and has a history of GERD and diverticulosis. Will get her back in for early follow up with GI - CBC - Ferritin  2. Edema, unspecified type Improved with increased dose of furosemide, check- Renal Function Panel  3. Epigastric pain   4. Gastroesophageal reflux disease without esophagitis Get back with GI for early follow up. Previously established with Dr. SGustavo Lah 5. Memory difficulty Recently started donepezil which she is tolerating well.   The entirety of the information documented in the History of Present Illness, Review of Systems and Physical Exam were personally obtained by me. Portions of this information were initially documented by RMeyer Cory CMA and reviewed by me for thoroughness and accuracy.      DLelon Huh MD  [facility]

## 2019-01-02 ENCOUNTER — Telehealth: Payer: Self-pay

## 2019-01-02 DIAGNOSIS — E876 Hypokalemia: Secondary | ICD-10-CM

## 2019-01-02 LAB — CBC
Hematocrit: 33 % — ABNORMAL LOW (ref 34.0–46.6)
Hemoglobin: 10.3 g/dL — ABNORMAL LOW (ref 11.1–15.9)
MCH: 25.8 pg — ABNORMAL LOW (ref 26.6–33.0)
MCHC: 31.2 g/dL — ABNORMAL LOW (ref 31.5–35.7)
MCV: 83 fL (ref 79–97)
Platelets: 114 10*3/uL — ABNORMAL LOW (ref 150–450)
RBC: 4 x10E6/uL (ref 3.77–5.28)
RDW: 18.2 % — ABNORMAL HIGH (ref 11.7–15.4)
WBC: 4.3 10*3/uL (ref 3.4–10.8)

## 2019-01-02 LAB — RENAL FUNCTION PANEL
Albumin: 4.2 g/dL (ref 3.7–4.7)
BUN/Creatinine Ratio: 24 (ref 12–28)
BUN: 12 mg/dL (ref 8–27)
CO2: 27 mmol/L (ref 20–29)
Calcium: 9.5 mg/dL (ref 8.7–10.3)
Chloride: 97 mmol/L (ref 96–106)
Creatinine, Ser: 0.49 mg/dL — ABNORMAL LOW (ref 0.57–1.00)
GFR calc Af Amer: 113 mL/min/{1.73_m2} (ref >59)
GFR calc non Af Amer: 98 mL/min/{1.73_m2} (ref >59)
Glucose: 113 mg/dL — ABNORMAL HIGH (ref 65–99)
Phosphorus: 2.6 mg/dL — ABNORMAL LOW (ref 3.0–4.3)
Potassium: 3 mmol/L — ABNORMAL LOW (ref 3.5–5.2)
Sodium: 138 mmol/L (ref 134–144)

## 2019-01-02 LAB — FERRITIN: Ferritin: 80 ng/mL (ref 15–150)

## 2019-01-02 MED ORDER — POTASSIUM CHLORIDE CRYS ER 20 MEQ PO TBCR: 20.0000 meq | EXTENDED_RELEASE_TABLET | Freq: Every day | ORAL | 4 refills | Status: DC

## 2019-01-02 NOTE — Telephone Encounter (Signed)
-----   Message from Birdie Sons, MD sent at 01/02/2019  8:10 AM EST ----- Iron levels are much better. Is still a little bit anemic. Can reduce iron supplement to 1 325mg  tablet daily. Potassium levels are too low. This is a side effect of the fluid pill. Need to add Kdur 35meq one tablet daily, #30, rf x 4.  Schedule follow up in about 3 months.

## 2019-01-02 NOTE — Telephone Encounter (Signed)
Patient advised and verbally voiced understanding. Prescription sent into pharmacy. Patient states she will call back to schedule 3 month follow up after she checks her daughter's schedule.

## 2019-01-03 ENCOUNTER — Other Ambulatory Visit: Payer: Self-pay | Admitting: Family Medicine

## 2019-01-07 ENCOUNTER — Other Ambulatory Visit: Payer: Self-pay | Admitting: Family Medicine

## 2019-01-07 ENCOUNTER — Telehealth: Payer: Self-pay

## 2019-01-07 ENCOUNTER — Ambulatory Visit: Payer: PPO | Attending: Internal Medicine

## 2019-01-07 ENCOUNTER — Telehealth: Payer: Self-pay | Admitting: Family Medicine

## 2019-01-07 DIAGNOSIS — Z20822 Contact with and (suspected) exposure to covid-19: Secondary | ICD-10-CM

## 2019-01-07 DIAGNOSIS — Z20828 Contact with and (suspected) exposure to other viral communicable diseases: Secondary | ICD-10-CM | POA: Diagnosis not present

## 2019-01-07 MED ORDER — LEVOTHYROXINE SODIUM 125 MCG PO TABS: ORAL_TABLET | ORAL | 4 refills | Status: DC

## 2019-01-07 NOTE — Telephone Encounter (Signed)
[facility] faxed request for the following medications:  levothyroxine (SYNTHROID) 125 MCG tablet  Note from pharmacy: Cannot get current MFG any longer.  Ok to switch to MFG we have in stock?  Please advise.  Thanks, [employer]

## 2019-01-07 NOTE — Telephone Encounter (Signed)
Caller advised that result not in system as of yet.

## 2019-01-07 NOTE — Telephone Encounter (Signed)
[facility] [facility] faxed refill request for the following medications:  levothyroxine (SYNTHROID, LEVOTHROID) 125 MCG tablet    Please advise.  Thanks, [employer]

## 2019-01-07 NOTE — Telephone Encounter (Signed)
That's fine, OK to change manufacturer's

## 2019-01-08 ENCOUNTER — Other Ambulatory Visit: Payer: Self-pay

## 2019-01-08 ENCOUNTER — Encounter (INDEPENDENT_AMBULATORY_CARE_PROVIDER_SITE_OTHER): Payer: Self-pay | Admitting: Vascular Surgery

## 2019-01-08 ENCOUNTER — Ambulatory Visit (INDEPENDENT_AMBULATORY_CARE_PROVIDER_SITE_OTHER): Payer: PPO | Admitting: Nurse Practitioner

## 2019-01-08 VITALS — BP 152/71 | HR 84 | Resp 16 | Wt 191.2 lb

## 2019-01-08 DIAGNOSIS — I671 Cerebral aneurysm, nonruptured: Secondary | ICD-10-CM | POA: Diagnosis not present

## 2019-01-08 DIAGNOSIS — I10 Essential (primary) hypertension: Secondary | ICD-10-CM

## 2019-01-08 DIAGNOSIS — I6523 Occlusion and stenosis of bilateral carotid arteries: Secondary | ICD-10-CM

## 2019-01-08 NOTE — Telephone Encounter (Signed)
[employer] advised.

## 2019-01-09 LAB — NOVEL CORONAVIRUS, NAA: SARS-CoV-2, NAA: DETECTED — AB

## 2019-01-09 LAB — SPECIMEN STATUS REPORT

## 2019-01-10 ENCOUNTER — Telehealth: Payer: Self-pay | Admitting: Unknown Physician Specialty

## 2019-01-10 NOTE — Telephone Encounter (Signed)
Called to discuss with patient about Covid symptoms and the use of bamlanivimab, a monoclonal antibody infusion for those with mild to moderate Covid symptoms and at a high risk of hospitalization.  Pt is qualified for this infusion at the [facility] infusion center due to Age > 65   Message left to call back

## 2019-01-13 ENCOUNTER — Encounter (INDEPENDENT_AMBULATORY_CARE_PROVIDER_SITE_OTHER): Payer: Self-pay | Admitting: Nurse Practitioner

## 2019-01-13 NOTE — Progress Notes (Signed)
SUBJECTIVE:  Patient ID: [name], female    DOB: 03/18/1945, 74 y.o.   MRN: [id number] Chief Complaint  Patient presents with  . New Patient (Initial Visit)    ref Fisher carotid stenosis    HPI  KARRISSA PARCHMENT is a 74 y.o. female that presents today after patient was found to have right internal carotid artery aneurysm.  This was initially found because the patient's daughter was concerned about the patient's issues with memory and concerned about possible dementia.  The patient has a number of medical issues including COPD, previous TIA, carotid artery disease diabetes, neuropathy, in addition to several others.  The patient's daughter reports that the patient will have moments where she will forget things that were said her diet and insulin remember.  She also does report having some neck pain as well.  Past Medical History:  Diagnosis Date  . Arthritis    osteo  . Cancer ([place]) 1969   CERVICAL CA  . COPD (chronic obstructive pulmonary disease) ([place])   . Diabetes mellitus without complication ([place])   . Diabetic peripheral neuropathy ([place])   . Environmental and seasonal allergies   . GERD (gastroesophageal reflux disease)   . History of cervical cancer   . Hypercholesteremia   . Hypertension   . Hypothyroidism   . Neuropathy   . Sleep apnea   . TIA (transient ischemic attack)   . Von Willebrand's disease [facility])     Past Surgical History:  Procedure Laterality Date  . ABDOMINAL HYSTERECTOMY  1969   due to cervical cancer   . APPENDECTOMY    . BLADDER SURGERY  1997   bladder tacking  . BREAST BIOPSY Left 01/21/2015   FIBROADENOMATOUS CHANGE WITH ASSOCIATED MICROCALCIFICATIONS  . CARDIAC CATHETERIZATION    . CARPAL TUNNEL RELEASE  2010  . colonoscopy and upper endoscopy'    . COLONOSCOPY WITH PROPOFOL N/A 02/25/2016   Procedure: COLONOSCOPY WITH PROPOFOL;  Surgeon: Lollie Sails, MD;  Location[facility] ENDOSCOPY;  Service: Endoscopy;  Laterality: N/A;  .  OOPHORECTOMY    . TENDON RELEASE  2010   right foot, Dr. Vickki Muff   . TOTAL KNEE ARTHROPLASTY Left 06/14/2016   Procedure: TOTAL KNEE ARTHROPLASTY;  Surgeon: Corky Mull, MD;  Location: [facility] ORS;  Service: Orthopedics;  Laterality: Left;    Social History   Socioeconomic History  . Marital status: Single    Spouse name: Not on file  . Number of children: 3  . Years of education: Not on file  . Highest education level: 8th grade  Occupational History  . Occupation: retired  Tobacco Use  . Smoking status: Former Smoker    Packs/day: 1.50    Years: 48.00    Pack years: 72.00    Types: Cigarettes    Quit date: 01/11/2007    Years since quitting: 12.0  . Smokeless tobacco: Never Used  . Tobacco comment: smoked up to 2 ppd since 74 yo. quit in 2009  Substance and Sexual Activity  . Alcohol use: No    Alcohol/week: 0.0 standard drinks  . Drug use: No  . Sexual activity: Not on file  Other Topics Concern  . Not on file  Social History Narrative  . Not on file   Social Determinants of Health   Financial Resource Strain:   . Difficulty of Paying Living Expenses: Not on file  Food Insecurity:   . Worried About Charity fundraiser in the Last Year: Not on file  .  Ran Out of Food in the Last Year: Not on file  Transportation Needs:   . Lack of Transportation (Medical): Not on file  . Lack of Transportation (Non-Medical): Not on file  Physical Activity:   . Days of Exercise per Week: Not on file  . Minutes of Exercise per Session: Not on file  Stress:   . Feeling of Stress : Not on file  Social Connections:   . Frequency of Communication with Friends and Family: Not on file  . Frequency of Social Gatherings with Friends and Family: Not on file  . Attends Religious Services: Not on file  . Active Member of Clubs or Organizations: Not on file  . Attends Archivist Meetings: Not on file  . Marital Status: Not on file  Intimate Partner Violence:   . Fear of Current or  Ex-Partner: Not on file  . Emotionally Abused: Not on file  . Physically Abused: Not on file  . Sexually Abused: Not on file    Family History  Problem Relation Age of Onset  . Alzheimer's disease Mother   . Cancer Father   . Diabetes Sister   . Colon cancer Sister   . Diabetes Brother   . Cancer Sister 46       melanoma  . Breast cancer Maternal Aunt   . Hodgkin's lymphoma Sister     Allergies  Allergen Reactions  . Aspirin     AVOIDs due to history of Von Willebrands  . Gabapentin     dizzy  . Lyrica [Pregabalin] Other (See Comments)    Dizziness  . Tetracycline Rash     Review of Systems   Review of Systems: Negative Unless Checked Constitutional: [] Weight loss  [] Fever  [] Chills Cardiac: [] Chest pain   []  Atrial Fibrillation  [] Palpitations   [] Shortness of breath when laying flat   [] Shortness of breath with exertion. [] Shortness of breath at rest Vascular:  [] Pain in legs with walking   [] Pain in legs with standing [] Pain in legs when laying flat   [] Claudication    [] Pain in feet when laying flat    [] History of DVT   [] Phlebitis   [] Swelling in legs   [] Varicose veins   [] Non-healing ulcers Pulmonary:   [x] Uses home oxygen   [] Productive cough   [] Hemoptysis   [] Wheeze  [] COPD   [] Asthma Neurologic:  [] Dizziness   [] Seizures  [] Blackouts [] History of stroke   [x] History of TIA  [] Aphasia   [] Temporary Blindness   [] Weakness or numbness in arm   [] Weakness or numbness in leg Musculoskeletal:   [] Joint swelling   [] Joint pain   [x] Low back pain  []  History of Knee Replacement [x] Arthritis [] back Surgeries  []  Spinal Stenosis    Hematologic:  [] Easy bruising  [] Easy bleeding   [] Hypercoagulable state   [] Anemic Gastrointestinal:  [] Diarrhea   [] Vomiting  [] Gastroesophageal reflux/heartburn   [] Difficulty swallowing. [] Abdominal pain Genitourinary:  [] Chronic kidney disease   [] Difficult urination  [] Anuric   [] Blood in urine [] Frequent urination  [] Burning with  urination   [] Hematuria Skin:  [] Rashes   [] Ulcers [] Wounds Psychological:  [] History of anxiety   []  History of major depression  []  Memory Difficulties      OBJECTIVE:   Physical Exam  BP (!) 152/71 (BP Location: Right Arm)   Pulse 84   Resp 16   Wt 191 lb 3.2 oz (86.7 kg)   BMI 29.95 kg/m   Gen: WD/WN, NAD Head: [place]/AT, No temporalis wasting.  Ear/Nose/Throat: Hearing grossly intact, nares w/o erythema or drainage Eyes: PER, EOMI, sclera nonicteric.  Neck: Supple, no masses.  No JVD.  Pulmonary:  Good air movement, no use of accessory muscles.  Home O2.    Gastrointestinal: soft, non-distended. No guarding/no peritoneal signs.  Musculoskeletal: M/S 5/5 throughout.  No deformity or atrophy.  Neurologic: Pain and light touch intact in extremities.  Symmetrical.  Speech is fluent. Motor exam as listed above. Psychiatric: Judgment intact, Mood & affect appropriate for pt's clinical situation.        ASSESSMENT AND PLAN:  1. Right internal carotid artery aneurysm MRA was reviewed with Dr. Lucky Cowboy and based on area and position of carotid artery aneurysm, treatment of this is better suited for neurosurgery.  We will place a referral for the patient today. - Ambulatory referral to Neurosurgery  2. Essential hypertension Blood pressure under decent control at this visit.  Patient will continue on current medications.  We will continue to follow by PCP.  3. Carotid atherosclerosis, bilateral Based on MRA, the patient has a mild to moderate atherosclerosis in the carotid arteries.  This can be followed on an annual basis.   Current Outpatient Medications on File Prior to Visit  Medication Sig Dispense Refill  . acetaminophen (TYLENOL) 650 MG CR tablet Take 650 mg by mouth every 8 (eight) hours as needed for pain.    Marland Kitchen albuterol (ACCUNEB) 0.63 MG/3ML nebulizer solution Inhale into the lungs. Take 3 mLs (0.63 mg total) by nebulization every 6 (six) hours as needed for Wheezing      . albuterol (PROVENTIL HFA;VENTOLIN HFA) 108 (90 Base) MCG/ACT inhaler Inhale 2 puffs into the lungs every 6 (six) hours as needed for wheezing or shortness of breath. 1 Inhaler 3  . aspirin 81 MG tablet Take 1 tablet by mouth daily.    . Blood Glucose Monitoring Suppl (ONE TOUCH ULTRA 2) w/Device KIT Use daily to check blood sugar for type 2 diabetes E11.9 1 each 0  . calcium carbonate (CALCIUM 600) 600 MG TABS tablet Take 500 mg by mouth 2 (two) times daily with a meal.     . calcium carbonate (TUMS EX) 750 MG chewable tablet Chew 2 tablets by mouth 2 (two) times daily as needed for heartburn.    . cholecalciferol (VITAMIN D) 400 units TABS tablet Take 400 Units by mouth daily.    Marland Kitchen donepezil (ARICEPT) 5 MG tablet Take 1 tablet (5 mg total) by mouth at bedtime. 30 tablet 5  . furosemide (LASIX) 20 MG tablet Take 40 mg by mouth daily at 2 PM.     . glucose blood (ONE TOUCH ULTRA TEST) test strip Use as instructed to check blood sugar daily for type 2 diabetes E11.9 100 each 4  . levothyroxine (SYNTHROID) 125 MCG tablet TAKE 1 TABLET BY MOUTH ONCE DAILY BEFORE BREAKFAST 90 tablet 4  . losartan (COZAAR) 50 MG tablet Take 1 tablet by mouth once daily 90 tablet 04  . metFORMIN (GLUCOPHAGE-XR) 500 MG 24 hr tablet TAKE 2 TABLETS BY MOUTH IN THE MORNING, THEN 2 TABLETS BY MOUTH IN THE EVENING 360 tablet 4  . montelukast (SINGULAIR) 10 MG tablet Take 10 mg by mouth at bedtime.    . MULTIPLE VITAMIN PO Take 1 tablet by mouth daily.    . Omega-3 Fatty Acids (FISH OIL BURP-LESS) 1000 MG CAPS Take 1 tablet by mouth 2 (two) times daily.     . OXYGEN Inhale into the lungs.    Marland Kitchen  potassium chloride SA (KLOR-CON) 20 MEQ tablet Take 1 tablet (20 mEq total) by mouth daily. 30 tablet 4  . RABEprazole (ACIPHEX) 20 MG tablet Take 1 tablet by mouth once daily 30 tablet 12  . rosuvastatin (CRESTOR) 20 MG tablet TAKE 1 TABLET BY MOUTH ONCE DAILY 90 tablet 3  . Tiotropium Bromide Monohydrate (SPIRIVA RESPIMAT) 1.25  MCG/ACT AERS Inhale 1 spray into the lungs every morning.    . trolamine salicylate (ASPERCREME) 10 % cream Apply 1 application topically as needed for muscle pain.    . vitamin B-12 (CYANOCOBALAMIN) 50 MCG tablet Take 50 mcg by mouth daily.     No current facility-administered medications on file prior to visit.    There are no Patient Instructions on file for this visit. No follow-ups on file.   Kris Hartmann, NP  This note was completed with Sales executive.  Any errors are purely unintentional.

## 2019-01-15 ENCOUNTER — Other Ambulatory Visit: Payer: Self-pay | Admitting: *Deleted

## 2019-01-15 ENCOUNTER — Ambulatory Visit: Admission: RE | Admit: 2019-01-15 | Payer: PPO | Source: Ambulatory Visit

## 2019-01-15 DIAGNOSIS — R911 Solitary pulmonary nodule: Secondary | ICD-10-CM

## 2019-01-16 ENCOUNTER — Telehealth: Payer: Self-pay | Admitting: Family Medicine

## 2019-01-16 NOTE — Telephone Encounter (Signed)
Pharmacy sent fax asking if its ok to change manufactures since the previous [employer] is unavailable.

## 2019-01-16 NOTE — Telephone Encounter (Signed)
[facility] faxed refill request for the following medications:  levothyroxine (SYNTHROID) 125 MCG tablet  NOTE FROM PHARMACY: Can we change mfg to [employer]vogen?   [employer] unavailable.    Please advise.

## 2019-01-16 NOTE — Telephone Encounter (Signed)
That's fine, ok to change manufacturers

## 2019-01-21 ENCOUNTER — Ambulatory Visit: Payer: PPO | Admitting: Radiation Oncology

## 2019-01-22 ENCOUNTER — Other Ambulatory Visit: Payer: Self-pay

## 2019-01-22 ENCOUNTER — Ambulatory Visit
Admission: RE | Admit: 2019-01-22 | Discharge: 2019-01-22 | Disposition: A | Discharge status: Home / Self Care | Payer: PPO | Source: Ambulatory Visit | Attending: Family Medicine | Admitting: Family Medicine

## 2019-01-22 DIAGNOSIS — Z1231 Encounter for screening mammogram for malignant neoplasm of breast: Secondary | ICD-10-CM | POA: Diagnosis not present

## 2019-01-23 ENCOUNTER — Other Ambulatory Visit: Payer: Self-pay

## 2019-01-23 ENCOUNTER — Ambulatory Visit
Admission: RE | Admit: 2019-01-23 | Discharge: 2019-01-23 | Disposition: A | Discharge status: Home / Self Care | Payer: PPO | Source: Ambulatory Visit | Attending: Radiation Oncology | Admitting: Radiation Oncology

## 2019-01-23 DIAGNOSIS — C3490 Malignant neoplasm of unspecified part of unspecified bronchus or lung: Secondary | ICD-10-CM | POA: Diagnosis not present

## 2019-01-23 DIAGNOSIS — R911 Solitary pulmonary nodule: Secondary | ICD-10-CM | POA: Insufficient documentation

## 2019-01-23 MED ORDER — IOHEXOL 300 MG/ML  SOLN
75.0000 mL | Freq: Once | INTRAMUSCULAR | Status: AC | PRN
  Administered 2019-01-23: 75 mL via INTRAVENOUS

## 2019-01-25 DIAGNOSIS — I671 Cerebral aneurysm, nonruptured: Secondary | ICD-10-CM | POA: Diagnosis not present

## 2019-01-27 DIAGNOSIS — J449 Chronic obstructive pulmonary disease, unspecified: Secondary | ICD-10-CM | POA: Diagnosis not present

## 2019-01-30 ENCOUNTER — Encounter: Payer: Self-pay | Admitting: Radiation Oncology

## 2019-01-30 ENCOUNTER — Other Ambulatory Visit: Payer: Self-pay

## 2019-01-30 ENCOUNTER — Other Ambulatory Visit: Payer: Self-pay | Admitting: *Deleted

## 2019-01-30 ENCOUNTER — Ambulatory Visit
Admission: RE | Admit: 2019-01-30 | Discharge: 2019-01-30 | Disposition: A | Discharge status: Home / Self Care | Payer: PPO | Source: Ambulatory Visit | Attending: Radiation Oncology | Admitting: Radiation Oncology

## 2019-01-30 VITALS — BP 132/62 | HR 58 | Temp 96.0°F | Resp 22

## 2019-01-30 DIAGNOSIS — J1282 Pneumonia due to coronavirus disease 2019: Secondary | ICD-10-CM | POA: Diagnosis not present

## 2019-01-30 DIAGNOSIS — U071 COVID-19: Secondary | ICD-10-CM | POA: Diagnosis not present

## 2019-01-30 DIAGNOSIS — C3412 Malignant neoplasm of upper lobe, left bronchus or lung: Secondary | ICD-10-CM | POA: Insufficient documentation

## 2019-01-30 DIAGNOSIS — R911 Solitary pulmonary nodule: Secondary | ICD-10-CM

## 2019-01-30 DIAGNOSIS — I671 Cerebral aneurysm, nonruptured: Secondary | ICD-10-CM | POA: Insufficient documentation

## 2019-01-30 NOTE — Progress Notes (Signed)
Radiation Oncology Follow up Note  Name: Samantha Henry   Date:   01/30/2019 MRN:  [id number] DOB: 14-May-1945    This 74 y.o. female presents to the clinic today for 56-month follow-up status post.  SBRT to the left upper lobe for stage I non-small cell lung cancer  REFERRING PROVIDER: Birdie Sons, MD  HPI: Patient is a 74 year old female now out 4 months having completed SBRT to her left upper lobe for stage I non-small cell lung cancer..  She is seen today in routine follow-up and is doing fairly well.  She specifically denies any worsening of her pulmonary status cough hemoptysis or chest tightness.  She is recently been diagnosed with an cerebral aneurysm and she is being worked up for possible coil placement at [facility].  She had a recent CT scan.  Showing peripheral left upper lobe cyst with nodular component slightly decreased in size from June 2020.  She does have mild COVID-19 pneumonia.  By CT criteria  COMPLICATIONS OF TREATMENT: none  FOLLOW UP COMPLIANCE: keeps appointments   PHYSICAL EXAM:  BP 132/62   Pulse (!) 58   Temp (!) 96 F (35.6 C)   Resp (!) 70  Wheelchair-bound female on nasal oxygen in NAD.  Well-developed well-nourished patient in NAD. HEENT reveals PERLA, EOMI, discs not visualized.  Oral cavity is clear. No oral mucosal lesions are identified. Neck is clear without evidence of cervical or supraclavicular adenopathy. Lungs are clear to A&P. Cardiac examination is essentially unremarkable with regular rate and rhythm without murmur rub or thrill. Abdomen is benign with no organomegaly or masses noted. Motor sensory and DTR levels are equal and symmetric in the upper and lower extremities. Cranial nerves II through XII are grossly intact. Proprioception is intact. No peripheral adenopathy or edema is identified. No motor or sensory levels are noted. Crude visual fields are within normal range.  RADIOLOGY RESULTS: CT scan reviewed compatible with  above-stated findings  PLAN: Present time patient is doing well.  By CT criteria left upper lobe nodule is improved or stable.  Discussed about her cerebral aneurysm.  I have suggested that if she does not qualify based on her pulmonary function or other clinical parameters anesthesia she may be a candidate for radiosurgery and I would contact the people in radiation oncology at [facility].  I have asked to see her back in 6 months for follow-up with a CT scan prior to that visit.  Patient knows to call with any concerns at any time.  I would like to take this opportunity to thank you for allowing me to participate in the care of your patient.Noreene Filbert, MD

## 2019-02-08 ENCOUNTER — Other Ambulatory Visit: Payer: Self-pay | Admitting: Family Medicine

## 2019-02-12 ENCOUNTER — Other Ambulatory Visit: Payer: Self-pay | Admitting: Family Medicine

## 2019-02-12 NOTE — Telephone Encounter (Signed)
Requested Prescriptions  Pending Prescriptions Disp Refills  . ONETOUCH ULTRA test strip [employer] Med Name: OneTouch Ultra Blue In Vitro Strip] 100 each 4    Sig: USE TEST STRIP(S) TO CHECK BLOOD SUGAR DAILY FOR TYPE 2 DIABETES     Endocrinology: Diabetes - Testing Supplies Passed - 02/12/2019  5:06 PM      Passed - Valid encounter within last 12 months    Recent Outpatient Visits          1 month ago Iron deficiency anemia, unspecified iron deficiency anemia type   [facility] Birdie Sons, MD   2 months ago Lower extremity edema   [facility] [place], Dionne Bucy, MD   2 months ago Annual physical exam   [facility] Birdie Sons, MD   11 months ago Chronic obstructive pulmonary disease, unspecified COPD type [facility])   [facility] Birdie Sons, MD   1 year ago DDD (degenerative disc disease), cervical   [facility] Birdie Sons, MD

## 2019-02-21 DIAGNOSIS — R1033 Periumbilical pain: Secondary | ICD-10-CM | POA: Diagnosis not present

## 2019-02-21 DIAGNOSIS — D509 Iron deficiency anemia, unspecified: Secondary | ICD-10-CM | POA: Diagnosis not present

## 2019-02-27 DIAGNOSIS — J449 Chronic obstructive pulmonary disease, unspecified: Secondary | ICD-10-CM | POA: Diagnosis not present

## 2019-03-01 ENCOUNTER — Telehealth: Payer: Self-pay

## 2019-03-01 DIAGNOSIS — C3490 Malignant neoplasm of unspecified part of unspecified bronchus or lung: Secondary | ICD-10-CM | POA: Insufficient documentation

## 2019-03-01 DIAGNOSIS — R911 Solitary pulmonary nodule: Secondary | ICD-10-CM

## 2019-03-01 DIAGNOSIS — IMO0001 Reserved for inherently not codable concepts without codable children: Secondary | ICD-10-CM

## 2019-03-01 NOTE — Telephone Encounter (Signed)
Survivorship Care Plan visit completed.  Treatment summary reviewed and mailed to patient.  ASCO answers booklet reviewed and mailed to patient.  CARE program and Cancer Transitions discussed with patient along with other resources cancer center offers to patients and caregivers.  Patient verbalized understanding.  Pt in agreement for APP to call her on March 1st in am to discuss Survivorship Clinic.   Packet mailed

## 2019-03-07 DIAGNOSIS — R0600 Dyspnea, unspecified: Secondary | ICD-10-CM | POA: Diagnosis not present

## 2019-03-07 DIAGNOSIS — Z01818 Encounter for other preprocedural examination: Secondary | ICD-10-CM | POA: Diagnosis not present

## 2019-03-07 DIAGNOSIS — J449 Chronic obstructive pulmonary disease, unspecified: Secondary | ICD-10-CM | POA: Diagnosis not present

## 2019-03-08 DIAGNOSIS — Z9981 Dependence on supplemental oxygen: Secondary | ICD-10-CM | POA: Diagnosis not present

## 2019-03-08 DIAGNOSIS — I671 Cerebral aneurysm, nonruptured: Secondary | ICD-10-CM | POA: Diagnosis not present

## 2019-03-11 ENCOUNTER — Inpatient Hospital Stay: Payer: Medicare HMO | Attending: Nurse Practitioner | Admitting: Oncology

## 2019-03-11 DIAGNOSIS — C3412 Malignant neoplasm of upper lobe, left bronchus or lung: Secondary | ICD-10-CM

## 2019-03-11 DIAGNOSIS — I10 Essential (primary) hypertension: Secondary | ICD-10-CM | POA: Diagnosis not present

## 2019-03-11 DIAGNOSIS — Z7984 Long term (current) use of oral hypoglycemic drugs: Secondary | ICD-10-CM | POA: Diagnosis not present

## 2019-03-11 DIAGNOSIS — E785 Hyperlipidemia, unspecified: Secondary | ICD-10-CM

## 2019-03-11 DIAGNOSIS — U071 COVID-19: Secondary | ICD-10-CM

## 2019-03-11 DIAGNOSIS — E119 Type 2 diabetes mellitus without complications: Secondary | ICD-10-CM | POA: Diagnosis not present

## 2019-03-11 DIAGNOSIS — Z79899 Other long term (current) drug therapy: Secondary | ICD-10-CM

## 2019-03-11 DIAGNOSIS — J449 Chronic obstructive pulmonary disease, unspecified: Secondary | ICD-10-CM

## 2019-03-11 DIAGNOSIS — E039 Hypothyroidism, unspecified: Secondary | ICD-10-CM

## 2019-03-11 DIAGNOSIS — C3492 Malignant neoplasm of unspecified part of left bronchus or lung: Secondary | ICD-10-CM

## 2019-03-11 NOTE — Progress Notes (Signed)
Survivorship Clinic Consult Note [facility]  Telephone:([phone] [phone]  CLINIC:  Survivorship  REASON FOR VISIT:  Long-term survivorship surveillance visit for patient with history of stage 1 non small cell lung cancer.  I connected with [name] on 03/11/19 at 10:00 AM EST by telephone visit and verified that I am speaking with the correct person using two identifiers.   I discussed the limitations, risks, security and privacy concerns of performing an evaluation and management service by telemedicine and the availability of in-person appointments. I also discussed with the patient that there may be a patient responsible charge related to this service. The patient expressed understanding and agreed to proceed.   Other persons participating in the visit and their role in the encounter: None  Patient's location: Home Provider's location: Office  BRIEF ONCOLOGIC HISTORY:  Oncology History Overview Note  Patient is a 74 year old female now out 4 months having completed SBRT to her left upper lobe for stage I non-small cell lung cancer..  She is seen today in routine follow-up and is doing fairly well.  She specifically denies any worsening of her pulmonary status cough hemoptysis or chest tightness.  She is recently been diagnosed with an cerebral aneurysm and she is being worked up for possible coil placement at [facility].  She had a recent CT scan.  Showing peripheral left upper lobe cyst with nodular component slightly decreased in size from June 2020.  She does have mild COVID-19 pneumonia.  By CT criteria   Non-small cell lung cancer (NSCLC) ([place])  03/01/2019 Initial Diagnosis   Non-small cell lung cancer (NSCLC) ([place])    INTERVAL HISTORY:  Mrs. Brafford is a 74 year old female who presents to survivorship clinic to review her care plan status post SBRT to left upper lobe for stage I non-small cell lung cancer.  She was last  evaluated by Dr. Donella Stade radiation oncologist on 01/30/2019 where she was doing fairly well.  She had recently been diagnosed with a cerebral aneurysm and was being worked up for possible coil placement at [facility].  She recently had had a CT chest which revealed peripheral left upper lobe cyst with nodular component slightly decreased in size from June 2020.  She was also noted to have a mild COVID-19 pneumonia.  ADDITIONAL REVIEW OF SYSTEMS:  Review of Systems  Constitutional: Negative.  Negative for chills, fever, malaise/fatigue and weight loss.  HENT: Negative for congestion, ear pain and tinnitus.   Eyes: Negative.  Negative for blurred vision and double vision.  Respiratory: Negative.  Negative for cough, sputum production and shortness of breath.   Cardiovascular: Negative.  Negative for chest pain, palpitations and leg swelling.  Gastrointestinal: Negative.  Negative for abdominal pain, constipation, diarrhea, nausea and vomiting.  Genitourinary: Negative for dysuria, frequency and urgency.  Musculoskeletal: Negative for back pain and falls.  Skin: Negative.  Negative for rash.  Neurological: Negative.  Negative for weakness and headaches.  Endo/Heme/Allergies: Negative.  Does not bruise/bleed easily.  Psychiatric/Behavioral: Negative.  Negative for depression. The patient is not nervous/anxious and does not have insomnia.      PAST MEDICAL & SURGICAL HISTORY:  Past Medical History:  Diagnosis Date  . Arthritis    osteo  . Cancer ([place]) 1969   CERVICAL CA  . COPD (chronic obstructive pulmonary disease) ([place])   . Diabetes mellitus without complication ([place])   . Diabetic peripheral neuropathy ([place])   . Environmental and seasonal allergies   . GERD (  gastroesophageal reflux disease)   . History of cervical cancer   . Hypercholesteremia   . Hypertension   . Hypothyroidism   . Neuropathy   . Sleep apnea   . TIA (transient ischemic attack)   . Von Willebrand's disease  [facility])    Past Surgical History:  Procedure Laterality Date  . ABDOMINAL HYSTERECTOMY  1969   due to cervical cancer   . APPENDECTOMY    . BLADDER SURGERY  1997   bladder tacking  . BREAST BIOPSY Left 01/21/2015   FIBROADENOMATOUS CHANGE WITH ASSOCIATED MICROCALCIFICATIONS  . CARDIAC CATHETERIZATION    . CARPAL TUNNEL RELEASE  2010  . colonoscopy and upper endoscopy'    . COLONOSCOPY WITH PROPOFOL N/A 02/25/2016   Procedure: COLONOSCOPY WITH PROPOFOL;  Surgeon: Lollie Sails, MD;  Location: [facility] ENDOSCOPY;  Service: Endoscopy;  Laterality: N/A;  . OOPHORECTOMY    . TENDON RELEASE  2010   right foot, Dr. Vickki Muff   . TOTAL KNEE ARTHROPLASTY Left 06/14/2016   Procedure: TOTAL KNEE ARTHROPLASTY;  Surgeon: Corky Mull, MD;  Location: [facility] ORS;  Service: Orthopedics;  Laterality: Left;    SOCIAL HISTORY:  None   CURRENT MEDICATIONS:  Current Outpatient Medications on File Prior to Visit  Medication Sig Dispense Refill  . acetaminophen (TYLENOL) 650 MG CR tablet Take 650 mg by mouth every 8 (eight) hours as needed for pain.    Marland Kitchen albuterol (ACCUNEB) 0.63 MG/3ML nebulizer solution Inhale into the lungs. Take 3 mLs (0.63 mg total) by nebulization every 6 (six) hours as needed for Wheezing    . albuterol (PROVENTIL HFA;VENTOLIN HFA) 108 (90 Base) MCG/ACT inhaler Inhale 2 puffs into the lungs every 6 (six) hours as needed for wheezing or shortness of breath. 1 Inhaler 3  . aspirin 81 MG tablet Take 1 tablet by mouth daily.    . Blood Glucose Monitoring Suppl (ONE TOUCH ULTRA 2) w/Device KIT Use daily to check blood sugar for type 2 diabetes E11.9 1 each 0  . calcium carbonate (CALCIUM 600) 600 MG TABS tablet Take 500 mg by mouth 2 (two) times daily with a meal.     . calcium carbonate (TUMS EX) 750 MG chewable tablet Chew 2 tablets by mouth 2 (two) times daily as needed for heartburn.    . cholecalciferol (VITAMIN D) 400 units TABS tablet Take 400 Units by mouth daily.    Marland Kitchen donepezil  (ARICEPT) 5 MG tablet Take 1 tablet (5 mg total) by mouth at bedtime. 30 tablet 5  . furosemide (LASIX) 20 MG tablet Take 40 mg by mouth daily at 2 PM.     . levothyroxine (SYNTHROID) 125 MCG tablet TAKE 1 TABLET BY MOUTH ONCE DAILY BEFORE BREAKFAST 90 tablet 4  . losartan (COZAAR) 50 MG tablet Take 1 tablet by mouth once daily 90 tablet 04  . metFORMIN (GLUCOPHAGE-XR) 500 MG 24 hr tablet TAKE 2 TABLETS BY MOUTH IN THE MORNING, THEN 2 TABLETS BY MOUTH IN THE EVENING 360 tablet 4  . montelukast (SINGULAIR) 10 MG tablet Take 10 mg by mouth at bedtime.    . MULTIPLE VITAMIN PO Take 1 tablet by mouth daily.    . Omega-3 Fatty Acids (FISH OIL BURP-LESS) 1000 MG CAPS Take 1 tablet by mouth 2 (two) times daily.     Glory Rosebush ULTRA test strip USE TEST STRIP(S) TO CHECK BLOOD SUGAR DAILY FOR TYPE 2 DIABETES 100 each 4  . OXYGEN Inhale into the lungs.    . potassium  chloride SA (KLOR-CON) 20 MEQ tablet Take 1 tablet (20 mEq total) by mouth daily. 30 tablet 4  . RABEprazole (ACIPHEX) 20 MG tablet Take 1 tablet by mouth once daily 30 tablet 12  . rosuvastatin (CRESTOR) 20 MG tablet Take 1 tablet by mouth once daily 90 tablet 0  . SYMBICORT 160-4.5 MCG/ACT inhaler SMARTSIG:2 Puff(s) By Mouth Twice Daily    . Tiotropium Bromide Monohydrate (SPIRIVA RESPIMAT) 1.25 MCG/ACT AERS Inhale 1 spray into the lungs every morning.    . trolamine salicylate (ASPERCREME) 10 % cream Apply 1 application topically as needed for muscle pain.    . vitamin B-12 (CYANOCOBALAMIN) 50 MCG tablet Take 50 mcg by mouth daily.     No current facility-administered medications on file prior to visit.    ALLERGIES:  Allergies  Allergen Reactions  . Aspirin     AVOIDs due to history of Von Willebrands  . Gabapentin     dizzy  . Lyrica [Pregabalin] Other (See Comments)    Dizziness  . Tetracycline Rash    PHYSICAL EXAM:  There were no vitals filed for this visit. There were no vitals filed for this visit.  Physical Exam  limited due to telephone visit.  LABORATORY DATA:  Lab Results  Component Value Date   WBC 4.3 01/01/2019   HGB 10.3 (L) 01/01/2019   HCT 33.0 (L) 01/01/2019   MCV 83 01/01/2019   PLT 114 (L) 01/01/2019      Chemistry      Component Value Date/Time   NA 138 01/01/2019 1526   K 3.0 (L) 01/01/2019 1526   CL 97 01/01/2019 1526   CO2 27 01/01/2019 1526   BUN 12 01/01/2019 1526   CREATININE 0.49 (L) 01/01/2019 1526   CREATININE 0.58 (L) 12/23/2016 1141      Component Value Date/Time   CALCIUM 9.5 01/01/2019 1526   ALKPHOS 51 11/23/2018 1110   AST 15 11/23/2018 1110   ALT 13 11/23/2018 1110   BILITOT 0.3 11/23/2018 1110     DIAGNOSTIC IMAGING:  CT chest 01/23/2019  IMPRESSION: 1. Mild COVID-19 pneumonia. 2. Peripheral left upper lobe cyst with a nodular component, slightly decreased in size from 07/10/2018. CT chest without contrast in 6 months is recommended to ensure stability or resolution, as malignancy cannot be definitively excluded. 3. Aortic atherosclerosis (ICD10-I70.0). Coronary artery calcification heart 4. Enlarged pulmonic trunk, indicative of pulmonary arterial hypertension. 5.  Emphysema (ICD10-J43.9).   ASSESSMENT & PLAN:  Ms. Linden is a pleasant 74 y.o. female with history of stage I non-small cell lung cancer completed with SBRT on 09/10/2018.  She presents to survivorship clinic for her care plan and to address any acute survivorship concerns after completing treatment  1. History of stage I nonsmall cell cancer: Clinically, she has a stable upper lobe nodule that is even slightly decreased in size since SBRT.  She will have a repeat CT chest in approximately 6 months from previous..  Today, she received a copy of her survivorship care plan (SCP) document, which was reviewed with her in detail.  The SCP details her cancer treatment history and potential late/long-term side effects of those treatments.  We discussed the follow-up schedule she can  anticipate with interval imaging for surveillance of her cancer.  I have also shared a copy of her treatment summary/SCP with her PCP.  Ms. Mowrer will return to the survivorship clinic as needed; she will return to [place] at [facility] for surveillance visit with Dr. Donella Stade in  July 2021.  2. Problem at visit: None.  3. Smoking cessation: I commended Ms. Kubicek continued efforts to remain tobacco-free.  We discussed that one of the most important risk reduction strategies in preventing cancer recurrence in lung cancer patients is smoking cessation.  He is committed to abstaining from tobacco.  4. Physical activity/Healthy eating: Getting adequate physical activity and maintaining a healthy diet as a cancer survivor is important for overall wellness and reduces the risk of cancer recurrence. We discussed the CARE program which is a fitness program that is offered to cancer survivors free of charge.  We also reviewed the American Cancer Society's booklet with recommendations for nutrition and physical activity.    4. Health promotion/Cancer screening:  Ms. Wojdyla is reportedly up-to-date on her colonoscopy, pap smear, PSA tests, skin screenings, and vaccinations.  I encouraged her to talk with his PCP about arranging appropriate cancer screening tests, as appropriate.   5. Support services/Counseling: Ms. Nadel was seen today in in effort to address both the physical and social concerns of our cancer survivors at [facility] at [facility]. It is not uncommon for this period of the patient's cancer care trajectory to be one of many emotions and stressors.  I provided support today through active listening, validation of concerns, and expressive supportive counseling.  Ms. Trawick was encouraged to take advantage of our support services programs and support groups to better cope in her new life as a cancer survivor after completing anti-cancer treatment.   Review NCCN  guidelines:    Dispo:  -RTC on 07/22/2019 for repeat chest CT. -RTC on 08/01/2019 for follow-up with Dr. Donella Stade and to review scan. -Return to survivorship clinic as needed; no additional follow-up needed at this time.  -Consider transitioning the patient to long-term survivorship, when clinically appropriate.   I provided 15 minutes of non face-to-face telephone visit time during this encounter, and > 50% was spent counseling as documented under my assessment & plan.  Rulon Abide, AGNP-C [place] [facility] [place] (office) 03/11/19 10:18 AM

## 2019-03-12 DIAGNOSIS — Z01818 Encounter for other preprocedural examination: Secondary | ICD-10-CM | POA: Diagnosis not present

## 2019-03-12 DIAGNOSIS — J449 Chronic obstructive pulmonary disease, unspecified: Secondary | ICD-10-CM | POA: Diagnosis not present

## 2019-03-13 DIAGNOSIS — R69 Illness, unspecified: Secondary | ICD-10-CM | POA: Diagnosis not present

## 2019-03-15 NOTE — Addendum Note (Signed)
Addended by: Faythe Casa E on: 03/15/2019 12:42 PM   Modules accepted: Level of Service

## 2019-03-20 ENCOUNTER — Other Ambulatory Visit
Admission: RE | Admit: 2019-03-20 | Discharge: 2019-03-20 | Disposition: A | Discharge status: Home / Self Care | Payer: Medicare HMO | Source: Ambulatory Visit | Attending: General Surgery | Admitting: General Surgery

## 2019-03-20 DIAGNOSIS — Z20822 Contact with and (suspected) exposure to covid-19: Secondary | ICD-10-CM | POA: Insufficient documentation

## 2019-03-20 DIAGNOSIS — Z01812 Encounter for preprocedural laboratory examination: Secondary | ICD-10-CM | POA: Diagnosis not present

## 2019-03-20 LAB — SARS CORONAVIRUS 2 (TAT 6-24 HRS): SARS Coronavirus 2: NEGATIVE

## 2019-03-21 ENCOUNTER — Encounter: Payer: Self-pay | Admitting: General Surgery

## 2019-03-22 ENCOUNTER — Encounter
Admission: RE | Disposition: A | Discharge status: Home / Self Care | Payer: Self-pay | Source: Ambulatory Visit | Attending: General Surgery

## 2019-03-22 ENCOUNTER — Ambulatory Visit
Admission: RE | Admit: 2019-03-22 | Discharge: 2019-03-22 | Disposition: A | Discharge status: Home / Self Care | Payer: Medicare HMO | Source: Ambulatory Visit | Attending: General Surgery | Admitting: General Surgery

## 2019-03-22 ENCOUNTER — Ambulatory Visit: Payer: Medicare HMO | Admitting: Registered Nurse

## 2019-03-22 ENCOUNTER — Encounter: Payer: Self-pay | Admitting: General Surgery

## 2019-03-22 ENCOUNTER — Other Ambulatory Visit: Payer: Self-pay

## 2019-03-22 DIAGNOSIS — D680 Von Willebrand's disease: Secondary | ICD-10-CM | POA: Diagnosis not present

## 2019-03-22 DIAGNOSIS — K5730 Diverticulosis of large intestine without perforation or abscess without bleeding: Secondary | ICD-10-CM | POA: Diagnosis not present

## 2019-03-22 DIAGNOSIS — K5790 Diverticulosis of intestine, part unspecified, without perforation or abscess without bleeding: Secondary | ICD-10-CM | POA: Diagnosis not present

## 2019-03-22 DIAGNOSIS — R1033 Periumbilical pain: Secondary | ICD-10-CM | POA: Insufficient documentation

## 2019-03-22 DIAGNOSIS — D509 Iron deficiency anemia, unspecified: Secondary | ICD-10-CM | POA: Insufficient documentation

## 2019-03-22 DIAGNOSIS — K219 Gastro-esophageal reflux disease without esophagitis: Secondary | ICD-10-CM | POA: Insufficient documentation

## 2019-03-22 DIAGNOSIS — R1013 Epigastric pain: Secondary | ICD-10-CM | POA: Diagnosis not present

## 2019-03-22 DIAGNOSIS — Z96652 Presence of left artificial knee joint: Secondary | ICD-10-CM | POA: Insufficient documentation

## 2019-03-22 DIAGNOSIS — I2510 Atherosclerotic heart disease of native coronary artery without angina pectoris: Secondary | ICD-10-CM | POA: Diagnosis not present

## 2019-03-22 DIAGNOSIS — Z7982 Long term (current) use of aspirin: Secondary | ICD-10-CM | POA: Diagnosis not present

## 2019-03-22 DIAGNOSIS — I10 Essential (primary) hypertension: Secondary | ICD-10-CM | POA: Insufficient documentation

## 2019-03-22 DIAGNOSIS — Z79890 Hormone replacement therapy (postmenopausal): Secondary | ICD-10-CM | POA: Insufficient documentation

## 2019-03-22 DIAGNOSIS — G4730 Sleep apnea, unspecified: Secondary | ICD-10-CM | POA: Diagnosis not present

## 2019-03-22 DIAGNOSIS — Z7951 Long term (current) use of inhaled steroids: Secondary | ICD-10-CM | POA: Diagnosis not present

## 2019-03-22 DIAGNOSIS — G4733 Obstructive sleep apnea (adult) (pediatric): Secondary | ICD-10-CM | POA: Diagnosis not present

## 2019-03-22 DIAGNOSIS — Z79899 Other long term (current) drug therapy: Secondary | ICD-10-CM | POA: Diagnosis not present

## 2019-03-22 DIAGNOSIS — E1142 Type 2 diabetes mellitus with diabetic polyneuropathy: Secondary | ICD-10-CM | POA: Diagnosis not present

## 2019-03-22 DIAGNOSIS — E039 Hypothyroidism, unspecified: Secondary | ICD-10-CM | POA: Diagnosis not present

## 2019-03-22 DIAGNOSIS — E7800 Pure hypercholesterolemia, unspecified: Secondary | ICD-10-CM | POA: Diagnosis not present

## 2019-03-22 DIAGNOSIS — Z87891 Personal history of nicotine dependence: Secondary | ICD-10-CM | POA: Insufficient documentation

## 2019-03-22 DIAGNOSIS — Z8541 Personal history of malignant neoplasm of cervix uteri: Secondary | ICD-10-CM | POA: Insufficient documentation

## 2019-03-22 DIAGNOSIS — Z8673 Personal history of transient ischemic attack (TIA), and cerebral infarction without residual deficits: Secondary | ICD-10-CM | POA: Insufficient documentation

## 2019-03-22 DIAGNOSIS — J449 Chronic obstructive pulmonary disease, unspecified: Secondary | ICD-10-CM | POA: Diagnosis not present

## 2019-03-22 DIAGNOSIS — Z7984 Long term (current) use of oral hypoglycemic drugs: Secondary | ICD-10-CM | POA: Insufficient documentation

## 2019-03-22 DIAGNOSIS — K317 Polyp of stomach and duodenum: Secondary | ICD-10-CM | POA: Diagnosis not present

## 2019-03-22 DIAGNOSIS — E1140 Type 2 diabetes mellitus with diabetic neuropathy, unspecified: Secondary | ICD-10-CM | POA: Diagnosis not present

## 2019-03-22 HISTORY — DX: Personal history of colonic polyps: Z86.010

## 2019-03-22 HISTORY — DX: Restless legs syndrome: G25.81

## 2019-03-22 HISTORY — PX: COLONOSCOPY WITH PROPOFOL: SHX5780

## 2019-03-22 HISTORY — PX: ESOPHAGOGASTRODUODENOSCOPY (EGD) WITH PROPOFOL: SHX5813

## 2019-03-22 HISTORY — DX: Nail dystrophy: L60.3

## 2019-03-22 HISTORY — DX: Proteinuria, unspecified: R80.9

## 2019-03-22 HISTORY — DX: Personal history of colon polyps, unspecified: Z86.0100

## 2019-03-22 LAB — GLUCOSE, CAPILLARY: Glucose-Capillary: 89 mg/dL (ref 70–99)

## 2019-03-22 SURGERY — COLONOSCOPY WITH PROPOFOL
Anesthesia: General

## 2019-03-22 MED ORDER — PROPOFOL 10 MG/ML IV BOLUS
INTRAVENOUS | Status: DC | PRN
  Administered 2019-03-22: 50 mg via INTRAVENOUS

## 2019-03-22 MED ORDER — LIDOCAINE HCL (CARDIAC) PF 100 MG/5ML IV SOSY
PREFILLED_SYRINGE | INTRAVENOUS | Status: DC | PRN
  Administered 2019-03-22: 40 mg via INTRAVENOUS

## 2019-03-22 MED ORDER — PROPOFOL 500 MG/50ML IV EMUL
INTRAVENOUS | Status: AC
  Filled 2019-03-22: qty 50

## 2019-03-22 MED ORDER — GLYCOPYRROLATE 0.2 MG/ML IJ SOLN
INTRAMUSCULAR | Status: DC | PRN
  Administered 2019-03-22: .2 mg via INTRAVENOUS

## 2019-03-22 MED ORDER — MIDAZOLAM HCL 2 MG/2ML IJ SOLN
INTRAMUSCULAR | Status: AC
  Filled 2019-03-22: qty 2

## 2019-03-22 MED ORDER — PROPOFOL 500 MG/50ML IV EMUL
INTRAVENOUS | Status: DC | PRN
  Administered 2019-03-22: 150 ug/kg/min via INTRAVENOUS

## 2019-03-22 MED ORDER — SODIUM CHLORIDE 0.9 % IV SOLN
INTRAVENOUS | Status: DC
  Administered 2019-03-22: 1000 mL via INTRAVENOUS

## 2019-03-22 MED ORDER — MIDAZOLAM HCL 2 MG/2ML IJ SOLN
INTRAMUSCULAR | Status: DC | PRN
  Administered 2019-03-22: 2 mg via INTRAVENOUS

## 2019-03-22 NOTE — Anesthesia Procedure Notes (Signed)
Date/Time: 03/22/2019 8:09 AM Performed by: Doreen Salvage, CRNA Pre-anesthesia Checklist: Patient identified, Emergency Drugs available, Suction available and Patient being monitored Patient Re-evaluated:Patient Re-evaluated prior to induction Oxygen Delivery Method: Nasal cannula Induction Type: IV induction Dental Injury: Teeth and Oropharynx as per pre-operative assessment  Comments: Nasal cannula with etCO2 monitoring

## 2019-03-22 NOTE — Anesthesia Preprocedure Evaluation (Addendum)
Anesthesia Evaluation  Patient identified by MRN, date of birth, ID band Patient awake    Reviewed: Allergy & Precautions, H&P , NPO status , Patient's Chart, lab work & pertinent test results  History of Anesthesia Complications Negative for: history of anesthetic complications  Airway Mallampati: III  TM Distance: <3 FB Neck ROM: limited    Dental  (+) Poor Dentition, Chipped, Missing, Upper Dentures, Edentulous Upper   Pulmonary neg shortness of breath, sleep apnea , COPD, neg recent URI, former smoker,    Pulmonary exam normal breath sounds clear to auscultation       Cardiovascular Exercise Tolerance: Good hypertension, (-) angina+ CAD  (-) Past MI, (-) Cardiac Stents and (-) DOE Normal cardiovascular exam(-) dysrhythmias + Valvular Problems/Murmurs  Rhythm:regular Rate:Normal     Neuro/Psych neg Seizures PSYCHIATRIC DISORDERS Dementia TIA Neuromuscular disease    GI/Hepatic Neg liver ROS, GERD  Controlled,  Endo/Other  diabetes, Type 2Hypothyroidism   Renal/GU negative Renal ROS     Musculoskeletal   Abdominal   Peds  Hematology negative hematology ROS (+)   Anesthesia Other Findings Past Medical History: No date: Arthritis     Comment: osteo No date: Cancer (HCC)     Comment: CERVICAL CA No date: COPD (chronic obstructive pulmonary disease) (* No date: Diabetes mellitus without complication (HCC) No date: Diabetic peripheral neuropathy (HCC) No date: Environmental and seasonal allergies No date: GERD (gastroesophageal reflux disease) No date: History of cervical cancer No date: Hypercholesteremia No date: Hypertension No date: Hypothyroidism No date: Neuropathy No date: Sleep apnea No date: TIA (transient ischemic attack) No date: Von Willebrand's disease [facility])  Past Surgical History: 1971: ABDOMINAL HYSTERECTOMY     Comment: due to cervical cancer  No date: APPENDECTOMY 1997: BLADDER SURGERY     Comment: bladder tacking 01/21/2015: BREAST BIOPSY Left     Comment: NEG No date: CARDIAC CATHETERIZATION 2010: CARPAL TUNNEL RELEASE No date: colonoscopy and upper endoscopy' 02/25/2016: COLONOSCOPY WITH PROPOFOL N/A     Comment: Procedure: COLONOSCOPY WITH PROPOFOL;                Surgeon: Lollie Sails, MD;  Location: [facility]              ENDOSCOPY;  Service: Endoscopy;  Laterality:               N/A; 2010: TENDON RELEASE     Comment: right foot, Dr. Vickki Muff      Reproductive/Obstetrics negative OB ROS                             Anesthesia Physical  Anesthesia Plan  ASA: III  Anesthesia Plan: General   Post-op Pain Management:    Induction: Intravenous  PONV Risk Score and Plan: 4 or greater and Propofol, Treatment may vary due to age, Propofol infusion and TIVA  Airway Management Planned: Natural Airway and Nasal Cannula  Additional Equipment:   Intra-op Plan:   Post-operative Plan:   Informed Consent: I have reviewed the patients History and Physical, chart, labs and discussed the procedure including the risks, benefits and alternatives for the proposed anesthesia with the patient or authorized representative who has indicated his/her understanding and acceptance.     Dental Advisory Given  Plan Discussed with: Anesthesiologist, CRNA and Surgeon  Anesthesia Plan Comments: (Patient has VW (? Type 1 by verbal history) with no work up so plan for GA over spinal  Patient consented for risks  of anesthesia including but not limited to:  - adverse reactions to medications - damage to teeth, lips or other oral mucosa - sore throat or hoarseness - Damage to heart, brain, lungs or loss of life  Patient voiced understanding.)       Anesthesia Quick Evaluation

## 2019-03-22 NOTE — Anesthesia Postprocedure Evaluation (Signed)
Anesthesia Post Note  Patient: Samantha Henry  Procedure(s) Performed: COLONOSCOPY WITH PROPOFOL (N/A ) ESOPHAGOGASTRODUODENOSCOPY (EGD) WITH PROPOFOL (N/A )  Patient location during evaluation: Endoscopy Anesthesia Type: General Level of consciousness: awake and alert Pain management: pain level controlled Vital Signs Assessment: post-procedure vital signs reviewed and stable Respiratory status: spontaneous breathing, nonlabored ventilation, respiratory function stable and patient connected to nasal cannula oxygen Cardiovascular status: blood pressure returned to baseline and stable Postop Assessment: no apparent nausea or vomiting Anesthetic complications: no     Last Vitals:  Vitals:   03/22/19 0850 03/22/19 0920  BP: (!) 102/52 129/64  Pulse:    Resp:    Temp: (!) 36.4 C   SpO2:      Last Pain:  Vitals:   03/22/19 0850  TempSrc: Temporal  PainSc: 0-No pain                 Martha Clan

## 2019-03-22 NOTE — H&P (Signed)
Samantha Henry [id number] [birth year]     HPI: 74 year old woman with multiple medical issues recently evaluated by the GI department for a modest fall in her hemoglobin with normal indices and mid abdominal pain.  Upper and lower endoscopy has been proposed.  The patient reports tolerating her prep well.  She was evaluated by pulmonology prior to the procedure and found to be an acceptable candidate for sedation.  She does exhibit a depressed FEV1 of 0.7 L.  Medications Prior to Admission  Medication Sig Dispense Refill Last Dose  . acetaminophen (TYLENOL) 650 MG CR tablet Take 650 mg by mouth every 8 (eight) hours as needed for pain.   Past Week at Unknown time  . albuterol (ACCUNEB) 0.63 MG/3ML nebulizer solution Inhale into the lungs. Take 3 mLs (0.63 mg total) by nebulization every 6 (six) hours as needed for Wheezing   03/21/2019 at Unknown time  . albuterol (PROVENTIL HFA;VENTOLIN HFA) 108 (90 Base) MCG/ACT inhaler Inhale 2 puffs into the lungs every 6 (six) hours as needed for wheezing or shortness of breath. 1 Inhaler 3 03/21/2019 at Unknown time  . aspirin 81 MG tablet Take 1 tablet by mouth daily.   Past Week at Unknown time  . Blood Glucose Monitoring Suppl (ONE TOUCH ULTRA 2) w/Device KIT Use daily to check blood sugar for type 2 diabetes E11.9 1 each 0 03/21/2019 at Unknown time  . calcium carbonate (CALCIUM 600) 600 MG TABS tablet Take 500 mg by mouth 2 (two) times daily with a meal.    Past Week at Unknown time  . calcium carbonate (TUMS EX) 750 MG chewable tablet Chew 2 tablets by mouth 2 (two) times daily as needed for heartburn.   Past Week at Unknown time  . cholecalciferol (VITAMIN D) 400 units TABS tablet Take 400 Units by mouth daily.   Past Week at Unknown time  . donepezil (ARICEPT) 5 MG tablet Take 1 tablet (5 mg total) by mouth at bedtime. 30 tablet 5 03/21/2019 at Unknown time  . furosemide (LASIX) 20 MG tablet Take 40 mg by mouth daily at 2 PM.    03/21/2019 at Unknown  time  . levothyroxine (SYNTHROID) 125 MCG tablet TAKE 1 TABLET BY MOUTH ONCE DAILY BEFORE BREAKFAST 90 tablet 4 03/21/2019 at Unknown time  . losartan (COZAAR) 50 MG tablet Take 1 tablet by mouth once daily 90 tablet 04 03/21/2019 at Unknown time  . metFORMIN (GLUCOPHAGE-XR) 500 MG 24 hr tablet TAKE 2 TABLETS BY MOUTH IN THE MORNING, THEN 2 TABLETS BY MOUTH IN THE EVENING 360 tablet 4 Past Week at Unknown time  . montelukast (SINGULAIR) 10 MG tablet Take 10 mg by mouth at bedtime.   03/21/2019 at Unknown time  . MULTIPLE VITAMIN PO Take 1 tablet by mouth daily.   Past Week at Unknown time  . Omega-3 Fatty Acids (FISH OIL BURP-LESS) 1000 MG CAPS Take 1 tablet by mouth 2 (two) times daily.    Past Week at Unknown time  . ONETOUCH ULTRA test strip USE TEST STRIP(S) TO CHECK BLOOD SUGAR DAILY FOR TYPE 2 DIABETES 100 each 4 03/21/2019 at Unknown time  . OXYGEN Inhale into the lungs.   03/21/2019 at Unknown time  . potassium chloride SA (KLOR-CON) 20 MEQ tablet Take 1 tablet (20 mEq total) by mouth daily. 30 tablet 4 Past Week at Unknown time  . RABEprazole (ACIPHEX) 20 MG tablet Take 1 tablet by mouth once daily 30 tablet 12 03/21/2019 at Unknown time  .  rosuvastatin (CRESTOR) 20 MG tablet Take 1 tablet by mouth once daily 90 tablet 0 03/21/2019 at Unknown time  . SYMBICORT 160-4.5 MCG/ACT inhaler SMARTSIG:2 Puff(s) By Mouth Twice Daily   03/21/2019 at Unknown time  . Tiotropium Bromide Monohydrate (SPIRIVA RESPIMAT) 1.25 MCG/ACT AERS Inhale 1 spray into the lungs every morning.   03/21/2019 at Unknown time  . trolamine salicylate (ASPERCREME) 10 % cream Apply 1 application topically as needed for muscle pain.   03/21/2019 at Unknown time  . vitamin B-12 (CYANOCOBALAMIN) 50 MCG tablet Take 50 mcg by mouth daily.   Past Week at Unknown time   Allergies  Allergen Reactions  . Aspirin     AVOIDs due to history of Von Willebrands  . Gabapentin     dizzy  . Lyrica [Pregabalin] Other (See Comments)    Dizziness   . Tetracycline Rash   Past Medical History:  Diagnosis Date  . Arthritis    osteo  . Cancer ([place]) 1969   CERVICAL CA  . COPD (chronic obstructive pulmonary disease) ([place])   . Diabetes mellitus without complication ([place])   . Diabetic peripheral neuropathy ([place])   . Environmental and seasonal allergies   . GERD (gastroesophageal reflux disease)   . History of cervical cancer   . History of colon polyps   . Hypercholesteremia   . Hypertension   . Hypothyroidism   . Nail dystrophy   . Neuropathy   . Proteinuria   . Restless legs syndrome   . Sleep apnea   . TIA (transient ischemic attack)   . Von Willebrand's disease [facility])    Past Surgical History:  Procedure Laterality Date  . ABDOMINAL HYSTERECTOMY  1969   due to cervical cancer   . APPENDECTOMY    . BLADDER SURGERY  1997   bladder tacking  . BREAST BIOPSY Left 01/21/2015   FIBROADENOMATOUS CHANGE WITH ASSOCIATED MICROCALCIFICATIONS  . CARDIAC CATHETERIZATION    . CARPAL TUNNEL RELEASE  2010  . colonoscopy and upper endoscopy'    . COLONOSCOPY WITH PROPOFOL N/A 02/25/2016   Procedure: COLONOSCOPY WITH PROPOFOL;  Surgeon: Lollie Sails, MD;  Location: [facility] ENDOSCOPY;  Service: Endoscopy;  Laterality: N/A;  . JOINT REPLACEMENT Left 06/14/2016   KNEE  . MUSCLE BIOPSY    . OOPHORECTOMY    . TENDON RELEASE  2010   right foot, Dr. Vickki Muff   . TOTAL KNEE ARTHROPLASTY Left 06/14/2016   Procedure: TOTAL KNEE ARTHROPLASTY;  Surgeon: Corky Mull, MD;  Location: [facility] ORS;  Service: Orthopedics;  Laterality: Left;   Social History   Socioeconomic History  . Marital status: Single    Spouse name: Not on file  . Number of children: 3  . Years of education: Not on file  . Highest education level: 8th grade  Occupational History  . Occupation: retired  Tobacco Use  . Smoking status: Former Smoker    Packs/day: 1.50    Years: 48.00    Pack years: 72.00    Types: Cigarettes    Quit date: 01/11/2007    Years since  quitting: 12.2  . Smokeless tobacco: Never Used  . Tobacco comment: smoked up to 2 ppd since 74 yo. quit in 2009  Substance and Sexual Activity  . Alcohol use: No    Alcohol/week: 0.0 standard drinks  . Drug use: No  . Sexual activity: Not on file  Other Topics Concern  . Not on file  Social History Narrative  . Not on file  Social Determinants of Health   Financial Resource Strain:   . Difficulty of Paying Living Expenses:   Food Insecurity:   . Worried About Charity fundraiser in the Last Year:   . Arboriculturist in the Last Year:   Transportation Needs:   . Film/video editor (Medical):   Marland Kitchen Lack of Transportation (Non-Medical):   Physical Activity:   . Days of Exercise per Week:   . Minutes of Exercise per Session:   Stress:   . Feeling of Stress :   Social Connections:   . Frequency of Communication with Friends and Family:   . Frequency of Social Gatherings with Friends and Family:   . Attends Religious Services:   . Active Member of Clubs or Organizations:   . Attends Archivist Meetings:   Marland Kitchen Marital Status:   Intimate Partner Violence:   . Fear of Current or Ex-Partner:   . Emotionally Abused:   Marland Kitchen Physically Abused:   . Sexually Abused:    Social History   Social History Narrative  . Not on file     ROS: Negative.     PE: HEENT: Negative. Lungs: Clear. Cardio: RR.  Assessment/Plan:  Proceed with planned upper and lower endoscopy. Forest Gleason [facility] 03/22/2019

## 2019-03-22 NOTE — Op Note (Addendum)
[facility] Gastroenterology Patient Name: Samantha Henry Procedure Date: 03/22/2019 8:01 AM MRN: [id number] Account #: [id number] Date of Birth: 1945/08/21 Admit Type: Outpatient Age: 74 Room: [facility] ENDO ROOM 1 Gender: Female Note Status: Supervisor Override Procedure:             Colonoscopy Indications:           Periumbilical abdominal pain, Iron deficiency anemia Providers:             Robert Bellow, MD Medicines:             Monitored Anesthesia Care Complications:         No immediate complications. Procedure:             Pre-Anesthesia Assessment:                        - Prior to the procedure, a History and Physical was                         performed, and patient medications, allergies and                         sensitivities were reviewed. The patient's tolerance                         of previous anesthesia was reviewed.                        - The risks and benefits of the procedure and the                         sedation options and risks were discussed with the                         patient. All questions were answered and informed                         consent was obtained.                        After obtaining informed consent, the colonoscope was                         passed under direct vision. Throughout the procedure,                         the patient's blood pressure, pulse, and oxygen                         saturations were monitored continuously. The                         Colonoscope was introduced through the anus and                         advanced to the the cecum, identified by appendiceal                         orifice and ileocecal valve. The colonoscopy was  performed without difficulty. The patient tolerated                         the procedure well. The quality of the bowel                         preparation was excellent. Findings:      A few medium-mouthed diverticula were found  in the sigmoid colon.      The retroflexed view of the distal rectum and anal verge was normal and       showed no anal or rectal abnormalities. Impression:            - Diverticulosis in the sigmoid colon.                        - The distal rectum and anal verge are normal on                         retroflexion view.                        - No specimens collected. Recommendation:        - Discharge patient to home (via wheelchair). Procedure Code(s):     --- Professional ---                        [phone], Colonoscopy, flexible; diagnostic, including                         collection of specimen(s) by brushing or washing, when                         performed (separate procedure) Diagnosis Code(s):     --- Professional ---                        I62.70, Periumbilical pain                        K57.30, Diverticulosis of large intestine without                         perforation or abscess without bleeding CPT copyright 2019 American Medical Association. All rights reserved. The codes documented in this report are preliminary and upon coder review may  be revised to meet current compliance requirements. Robert Bellow, MD 03/22/2019 8:48:41 AM This report has been signed electronically. Number of Addenda: 0 Note Initiated On: 03/22/2019 8:01 AM Scope Withdrawal Time: 0 hours 11 minutes 18 seconds  Total Procedure Duration: 0 hours 27 minutes 4 seconds  Estimated Blood Loss:  Estimated blood loss: none.      [facility]

## 2019-03-22 NOTE — Op Note (Addendum)
[facility] Gastroenterology Patient Name: Samantha Henry Procedure Date: 03/22/2019 8:01 AM MRN: [id number] Account #: [id number] Date of Birth: 11/26/1945 Admit Type: Outpatient Age: 74 Room: [facility] ENDO ROOM 1 Gender: Female Note Status: Supervisor Override Procedure:             Upper GI endoscopy Indications:           Periumbilical abdominal pain, Iron deficiency anemia Providers:             Robert Bellow, MD Medicines:             Monitored Anesthesia Care Complications:         No immediate complications. Procedure:             Pre-Anesthesia Assessment:                        - Prior to the procedure, a History and Physical was                         performed, and patient medications, allergies and                         sensitivities were reviewed. The patient's tolerance                         of previous anesthesia was reviewed.                        - The risks and benefits of the procedure and the                         sedation options and risks were discussed with the                         patient. All questions were answered and informed                         consent was obtained.                        After obtaining informed consent, the endoscope was                         passed under direct vision. Throughout the procedure,                         the patient's blood pressure, pulse, and oxygen                         saturations were monitored continuously. The Endoscope                         was introduced through the mouth, and advanced to the                         second part of duodenum. The upper GI endoscopy was                         accomplished without difficulty. The patient tolerated  the procedure well. Findings:      The esophagus was normal.      The examined duodenum was normal.      A few 5 mm sessile polyps with no bleeding and no stigmata of recent       bleeding were found in  the stomach. Impression:            - Normal esophagus.                        - Normal examined duodenum.                        - A few gastric polyps.                        - No specimens collected. Recommendation:        - Perform a colonoscopy today. Procedure Code(s):     --- Professional ---                        [phone], Esophagogastroduodenoscopy, flexible,                         transoral; diagnostic, including collection of                         specimen(s) by brushing or washing, when performed                         (separate procedure) Diagnosis Code(s):     --- Professional ---                        K31.7, Polyp of stomach and duodenum                        A76.81, Periumbilical pain CPT copyright 2019 American Medical Association. All rights reserved. The codes documented in this report are preliminary and upon coder review may  be revised to meet current compliance requirements. Robert Bellow, MD 03/22/2019 8:16:20 AM This report has been signed electronically. Number of Addenda: 0 Note Initiated On: 03/22/2019 8:01 AM Estimated Blood Loss:  Estimated blood loss: none.      [facility]

## 2019-03-22 NOTE — Transfer of Care (Signed)
Immediate Anesthesia Transfer of Care Note  Patient: Samantha Henry  Procedure(s) Performed: Procedure(s): COLONOSCOPY WITH PROPOFOL (N/A) ESOPHAGOGASTRODUODENOSCOPY (EGD) WITH PROPOFOL (N/A)  Patient Location: PACU and Endoscopy Unit  Anesthesia Type:General  Level of Consciousness: sedated  Airway & Oxygen Therapy: Patient Spontanous Breathing and Patient connected to nasal cannula oxygen  Post-op Assessment: Report given to RN and Post -op Vital signs reviewed and stable  Post vital signs: Reviewed and stable  Last Vitals:  Vitals:   03/22/19 0725 03/22/19 0850  BP: 115/65 (!) 102/52  Pulse: 64   Resp: 20   Temp: (!) 35.9 C (!) 36.4 C  SpO2: 56%     Complications: No apparent anesthesia complications

## 2019-04-01 ENCOUNTER — Other Ambulatory Visit: Payer: Self-pay | Admitting: Family Medicine

## 2019-04-01 NOTE — Telephone Encounter (Signed)
Requested Prescriptions  Pending Prescriptions Disp Refills  . metFORMIN (GLUCOPHAGE-XR) 500 MG 24 hr tablet [Pharmacy Med Name: metFORMIN HCl ER 500 MG Oral Tablet Extended Release 24 Hour] 360 tablet 0    Sig: TAKE 2 TABLETS BY MOUTH IN THE MORNING ,THEN  2  TABLETS  IN  THE  EVENING     Endocrinology:  Diabetes - Biguanides Failed - 04/01/2019 11:17 AM      Failed - Cr in normal range and within 360 days    Creat  Date Value Ref Range Status  12/23/2016 0.58 (L) 0.60 - 0.93 mg/dL Final    Comment:    For patients >33 years of age, the reference limit for Creatinine is approximately 13% higher for people identified as African-American. .    Creatinine, Ser  Date Value Ref Range Status  01/01/2019 0.49 (L) 0.57 - 1.00 mg/dL Final   Creatinine, POC  Date Value Ref Range Status  01/21/2016 n/a mg/dL Final         Passed - HBA1C is between 0 and 7.9 and within 180 days    Hgb A1c MFr Bld  Date Value Ref Range Status  11/23/2018 6.3 (H) 4.8 - 5.6 % Final    Comment:             Prediabetes: 5.7 - 6.4          Diabetes: >6.4          Glycemic control for adults with diabetes: <7.0          Passed - eGFR in normal range and within 360 days    GFR, Est African American  Date Value Ref Range Status  12/23/2016 108 > OR = 60 mL/min/1.76m Final   GFR calc Af Amer  Date Value Ref Range Status  01/01/2019 113 >59 mL/min/1.73 Final   GFR, Est Non African American  Date Value Ref Range Status  12/23/2016 93 > OR = 60 mL/min/1.719mFinal   GFR calc non Af Amer  Date Value Ref Range Status  01/01/2019 98 >59 mL/min/1.73 Final         Passed - Valid encounter within last 6 months    Recent Outpatient Visits          3 months ago Iron deficiency anemia, unspecified iron deficiency anemia type   [facility]   3 months ago [facility] extremity edema   [facility]   4 months ago [facility] physical exam   [facility]   1 year ago [facility], [facility]  [facility]   1 year ago DDD (degenerative disc disease), cervical   [facility]

## 2019-04-02 ENCOUNTER — Other Ambulatory Visit: Payer: Self-pay | Admitting: Family Medicine

## 2019-04-02 DIAGNOSIS — R69 Illness, unspecified: Secondary | ICD-10-CM | POA: Diagnosis not present

## 2019-04-02 DIAGNOSIS — E876 Hypokalemia: Secondary | ICD-10-CM

## 2019-04-02 MED ORDER — POTASSIUM CHLORIDE CRYS ER 20 MEQ PO TBCR: 20.0000 meq | EXTENDED_RELEASE_TABLET | Freq: Every day | ORAL | 4 refills | Status: DC

## 2019-04-02 NOTE — Telephone Encounter (Signed)
[facility] faxed refill request for the following medications:   POTA CHLORIDE 20 MEQ ER TAB   Please advise.  Thanks, [employer]

## 2019-04-18 ENCOUNTER — Telehealth: Payer: Self-pay

## 2019-04-18 NOTE — Telephone Encounter (Signed)
Copied from [place] [phone]. Topic: General - Inquiry >> Apr 18, 2019  1:37 PM Alease Frame wrote: Reason for CRM: Pt returning call from office she stated she was disconnected and wasn't sure what the call was about . Please advise

## 2019-04-22 ENCOUNTER — Other Ambulatory Visit: Payer: Self-pay | Admitting: Family Medicine

## 2019-04-22 MED ORDER — RABEPRAZOLE SODIUM 20 MG PO TBEC: 20.0000 mg | DELAYED_RELEASE_TABLET | Freq: Every day | ORAL | 12 refills | Status: DC

## 2019-04-22 NOTE — Telephone Encounter (Signed)
[facility] faxed refill request for the following medications:   RABEprazole (ACIPHEX) 20 MG tablet   Please advise.  Thanks, [employer]

## 2019-04-29 ENCOUNTER — Other Ambulatory Visit: Payer: Self-pay

## 2019-04-29 ENCOUNTER — Encounter: Payer: Self-pay | Admitting: Family Medicine

## 2019-04-29 ENCOUNTER — Ambulatory Visit (INDEPENDENT_AMBULATORY_CARE_PROVIDER_SITE_OTHER): Payer: PPO | Admitting: Family Medicine

## 2019-04-29 VITALS — BP 125/55 | HR 59 | Temp 96.2°F | Wt 187.0 lb

## 2019-04-29 DIAGNOSIS — R600 Localized edema: Secondary | ICD-10-CM | POA: Diagnosis not present

## 2019-04-29 DIAGNOSIS — I359 Nonrheumatic aortic valve disorder, unspecified: Secondary | ICD-10-CM | POA: Diagnosis not present

## 2019-04-29 DIAGNOSIS — M549 Dorsalgia, unspecified: Secondary | ICD-10-CM

## 2019-04-29 DIAGNOSIS — G8929 Other chronic pain: Secondary | ICD-10-CM | POA: Diagnosis not present

## 2019-04-29 DIAGNOSIS — I872 Venous insufficiency (chronic) (peripheral): Secondary | ICD-10-CM | POA: Diagnosis not present

## 2019-04-29 DIAGNOSIS — I878 Other specified disorders of veins: Secondary | ICD-10-CM | POA: Insufficient documentation

## 2019-04-29 DIAGNOSIS — Z23 Encounter for immunization: Secondary | ICD-10-CM

## 2019-04-29 MED ORDER — BACLOFEN 5 MG PO TABS: 5.0000 mg | ORAL_TABLET | Freq: Three times a day (TID) | ORAL | 0 refills | Status: DC | PRN

## 2019-04-29 NOTE — Patient Instructions (Signed)

## 2019-04-29 NOTE — Progress Notes (Signed)
Established patient visit    Patient: Samantha Henry   DOB: 1945-11-03   74 y.o. Female  MRN: [id number] Visit Date: 04/29/2019  Today's healthcare provider: Lavon Paganini, MD   Chief Complaint  Patient presents with  . Edema    Bilateral Right worse than left.   . Back Pain    Mid to lower back pain   Subjective    HPI  Back Pain Pt states she started getting bilateral back pain a few months ago Pt states symptoms come and go. Pain occurs randomly She states it feels like "someone is twisting my back" Pain is located in the lower lumbar/sacral region and radiates up to lower thoracic region Pt states laying down makes it better   ------------------------------------------------------------------------------------  Leg Swelling Pt states she has leg swelling for years Symptoms have been worsening over the last few months Pt complains of dull baseline pain that can become sharp Symptoms worsen when walking and standing Pt is concerned with redness in the ankles bilaterally Pt states she takes BC powder and tylenol for the pain Pt states medication only helps a little Pt has a past history of cellulitis which patient states current symptoms are different to Pt is taking lasix for swelling with no relief     ------------------------------------------------------------------------------------     Past Medical History:  Diagnosis Date  . Arthritis    osteo  . Cancer ([place]) 1969   CERVICAL CA  . COPD (chronic obstructive pulmonary disease) ([place])   . Diabetes mellitus without complication ([place])   . Diabetic peripheral neuropathy ([place])   . Environmental and seasonal allergies   . GERD (gastroesophageal reflux disease)   . History of cervical cancer   . History of colon polyps   . Hypercholesteremia   . Hypertension   . Hypothyroidism   . Nail dystrophy   . Neuropathy   . Proteinuria   . Restless legs syndrome   . Sleep apnea   . TIA (transient  ischemic attack)   . Von Willebrand's disease [facility])    Past Surgical History:  Procedure Laterality Date  . ABDOMINAL HYSTERECTOMY  1969   due to cervical cancer   . APPENDECTOMY    . BLADDER SURGERY  1997   bladder tacking  . BREAST BIOPSY Left 01/21/2015   FIBROADENOMATOUS CHANGE WITH ASSOCIATED MICROCALCIFICATIONS  . CARDIAC CATHETERIZATION    . CARPAL TUNNEL RELEASE  2010  . colonoscopy and upper endoscopy'    . COLONOSCOPY WITH PROPOFOL N/A 02/25/2016   Procedure: COLONOSCOPY WITH PROPOFOL;  Surgeon: Lollie Sails, MD;  Location: [facility] ENDOSCOPY;  Service: Endoscopy;  Laterality: N/A;  . COLONOSCOPY WITH PROPOFOL N/A 03/22/2019   Procedure: COLONOSCOPY WITH PROPOFOL;  Surgeon: Robert Bellow, MD;  Location: [facility] ENDOSCOPY;  Service: Endoscopy;  Laterality: N/A;  . ESOPHAGOGASTRODUODENOSCOPY (EGD) WITH PROPOFOL N/A 03/22/2019   Procedure: ESOPHAGOGASTRODUODENOSCOPY (EGD) WITH PROPOFOL;  Surgeon: Robert Bellow, MD;  Location: [facility] ENDOSCOPY;  Service: Endoscopy;  Laterality: N/A;  . JOINT REPLACEMENT Left 06/14/2016   KNEE  . MUSCLE BIOPSY    . OOPHORECTOMY    . TENDON RELEASE  2010   right foot, Dr. Vickki Muff   . TOTAL KNEE ARTHROPLASTY Left 06/14/2016   Procedure: TOTAL KNEE ARTHROPLASTY;  Surgeon: Corky Mull, MD;  Location: [facility] ORS;  Service: Orthopedics;  Laterality: Left;   Social History   Tobacco Use  . Smoking status: Former Smoker    Packs/day: 1.50    Years: 48.00  Pack years: 72.00    Types: Cigarettes    Quit date: 01/11/2007    Years since quitting: 12.3  . Smokeless tobacco: Never Used  . Tobacco comment: smoked up to 2 ppd since 74 yo. quit in 2009  Substance Use Topics  . Alcohol use: No    Alcohol/week: 0.0 standard drinks  . Drug use: No   Social History   Socioeconomic History  . Marital status: Single    Spouse name: Not on file  . Number of children: 3  . Years of education: Not on file  . Highest education level: 8th grade    Occupational History  . Occupation: retired  Tobacco Use  . Smoking status: Former Smoker    Packs/day: 1.50    Years: 48.00    Pack years: 72.00    Types: Cigarettes    Quit date: 01/11/2007    Years since quitting: 12.3  . Smokeless tobacco: Never Used  . Tobacco comment: smoked up to 2 ppd since 74 yo. quit in 2009  Substance and Sexual Activity  . Alcohol use: No    Alcohol/week: 0.0 standard drinks  . Drug use: No  . Sexual activity: Not on file  Other Topics Concern  . Not on file  Social History Narrative  . Not on file   Social Determinants of Health   Financial Resource Strain:   . Difficulty of Paying Living Expenses:   Food Insecurity:   . Worried About Charity fundraiser in the Last Year:   . Arboriculturist in the Last Year:   Transportation Needs:   . Film/video editor (Medical):   Marland Kitchen Lack of Transportation (Non-Medical):   Physical Activity:   . Days of Exercise per Week:   . Minutes of Exercise per Session:   Stress:   . Feeling of Stress :   Social Connections:   . Frequency of Communication with Friends and Family:   . Frequency of Social Gatherings with Friends and Family:   . Attends Religious Services:   . Active Member of Clubs or Organizations:   . Attends Archivist Meetings:   Marland Kitchen Marital Status:   Intimate Partner Violence:   . Fear of Current or Ex-Partner:   . Emotionally Abused:   Marland Kitchen Physically Abused:   . Sexually Abused:        Medications: Outpatient Medications Prior to Visit  Medication Sig  . acetaminophen (TYLENOL) 650 MG CR tablet Take 650 mg by mouth every 8 (eight) hours as needed for pain.  Marland Kitchen albuterol (ACCUNEB) 0.63 MG/3ML nebulizer solution Inhale into the lungs. Take 3 mLs (0.63 mg total) by nebulization every 6 (six) hours as needed for Wheezing  . albuterol (PROVENTIL HFA;VENTOLIN HFA) 108 (90 Base) MCG/ACT inhaler Inhale 2 puffs into the lungs every 6 (six) hours as needed for wheezing or shortness of  breath.  . Blood Glucose Monitoring Suppl (ONE TOUCH ULTRA 2) w/Device KIT Use daily to check blood sugar for type 2 diabetes E11.9  . calcium carbonate (CALCIUM 600) 600 MG TABS tablet Take 500 mg by mouth 2 (two) times daily with a meal.   . calcium carbonate (TUMS EX) 750 MG chewable tablet Chew 2 tablets by mouth 2 (two) times daily as needed for heartburn.  . cholecalciferol (VITAMIN D) 400 units TABS tablet Take 400 Units by mouth daily.  Marland Kitchen donepezil (ARICEPT) 5 MG tablet Take 1 tablet (5 mg total) by mouth at bedtime.  . furosemide (  LASIX) 20 MG tablet Take 40 mg by mouth daily at 2 PM.   . levothyroxine (SYNTHROID) 125 MCG tablet TAKE 1 TABLET BY MOUTH ONCE DAILY BEFORE BREAKFAST  . losartan (COZAAR) 50 MG tablet Take 1 tablet by mouth once daily  . metFORMIN (GLUCOPHAGE-XR) 500 MG 24 hr tablet TAKE 2 TABLETS BY MOUTH IN THE MORNING ,THEN  2  TABLETS  IN  THE  EVENING  . montelukast (SINGULAIR) 10 MG tablet Take 10 mg by mouth at bedtime.  . MULTIPLE VITAMIN PO Take 1 tablet by mouth daily.  . Omega-3 Fatty Acids (FISH OIL BURP-LESS) 1000 MG CAPS Take 1 tablet by mouth 2 (two) times daily.   Glory Rosebush ULTRA test strip USE TEST STRIP(S) TO CHECK BLOOD SUGAR DAILY FOR TYPE 2 DIABETES  . OXYGEN Inhale into the lungs.  . potassium chloride SA (KLOR-CON) 20 MEQ tablet Take 1 tablet (20 mEq total) by mouth daily.  . RABEprazole (ACIPHEX) 20 MG tablet Take 1 tablet (20 mg total) by mouth daily.  . rosuvastatin (CRESTOR) 20 MG tablet Take 1 tablet by mouth once daily  . SYMBICORT 160-4.5 MCG/ACT inhaler SMARTSIG:2 Puff(s) By Mouth Twice Daily  . Tiotropium Bromide Monohydrate (SPIRIVA RESPIMAT) 1.25 MCG/ACT AERS Inhale 1 spray into the lungs every morning.  . vitamin B-12 (CYANOCOBALAMIN) 50 MCG tablet Take 50 mcg by mouth daily.  Marland Kitchen aspirin 81 MG tablet Take 1 tablet by mouth daily.  Marland Kitchen trolamine salicylate (ASPERCREME) 10 % cream Apply 1 application topically as needed for muscle pain.   No  facility-administered medications prior to visit.    Review of Systems  Constitutional: Negative.   HENT: Negative.   Eyes: Negative.   Respiratory: Negative.   Cardiovascular: Negative.   Gastrointestinal: Negative.   Endocrine: Negative.   Genitourinary: Negative.   Musculoskeletal: Positive for arthralgias, back pain, joint swelling and myalgias. Negative for gait problem.  Skin: Negative.   Allergic/Immunologic: Negative.   Neurological: Negative.   Hematological: Negative.   Psychiatric/Behavioral: Negative.     Last lipids Lab Results  Component Value Date   CHOL 145 11/23/2018   HDL 70 11/23/2018   LDLCALC 64 11/23/2018   TRIG 53 11/23/2018   CHOLHDL 2.1 11/23/2018       Objective    BP (!) 125/55 (BP Location: Left Arm, Patient Position: Sitting, Cuff Size: Large)   Pulse (!) 59   Temp (!) 96.2 F (35.7 C) (Temporal)   Wt 187 lb (84.8 kg)   BMI 29.29 kg/m  BP Readings from Last 3 Encounters:  04/29/19 (!) 125/55  03/22/19 129/64  01/30/19 132/62   Wt Readings from Last 3 Encounters:  04/29/19 187 lb (84.8 kg)  03/22/19 180 lb (81.6 kg)  01/08/19 191 lb 3.2 oz (86.7 kg)      Physical Exam Constitutional:      Appearance: Normal appearance.  HENT:     Head: Normocephalic and atraumatic.  Cardiovascular:     Rate and Rhythm: Normal rate and regular rhythm.     Pulses: Normal pulses.     Heart sounds: Murmur present. Systolic murmur present with a grade of 2/6.  Pulmonary:     Effort: Pulmonary effort is normal.     Breath sounds: Normal breath sounds.  Musculoskeletal:     Cervical back: Neck supple.     Lumbar back: No swelling, edema, deformity, signs of trauma, lacerations, spasms, tenderness or bony tenderness.     Right lower leg: Swelling and tenderness present. No  deformity or lacerations.     Left lower leg: Swelling and tenderness present. No deformity or lacerations.     Right ankle: Swelling present. No deformity, ecchymosis or  lacerations. Tenderness present over the lateral malleolus and medial malleolus. Normal range of motion. Normal pulse.     Left ankle: Swelling present. No deformity, ecchymosis or lacerations. Tenderness present over the lateral malleolus and medial malleolus. Normal range of motion. Normal pulse.  Skin:    General: Skin is warm and dry.  Neurological:     General: No focal deficit present.     Mental Status: She is alert and oriented to person, place, and time.  Psychiatric:        Mood and Affect: Mood normal.        Behavior: Behavior normal.      No results found for any visits on 04/29/19.   Assessment & Plan     Problem List Items Addressed This Visit      Cardiovascular and Mediastinum   Aortic valve calcification    Pt with history of aortic valve calcifications seen on CT 2/6 systoic murmur heard best over right upper sternal border  Plan: Will get an echocardiogram to evaluate heart function       Relevant Orders   ECHOCARDIOGRAM COMPLETE   Chronic venous insufficiency    Pt with a chronic history of bilateral lower leg edema that causes pain Symptoms have worsened over the last few months Symptoms most consistent with venous insufficiency   Cannot rule out cardiac causes of lower extremity edema given past medical history and risk factors  Plan: Recommend wearing compression stockings Recommend leg elevation when at home Will get an echocardiogram to evaluate heart function Will consider referral to Vascular if symptoms continue to persist        Other   Chronic bilateral back pain    Pt with symptoms of back pain that comes and goes Symptoms most consistent with muscular back spams  Plan: Will start taking baclofen for back pain       Relevant Medications   Baclofen 5 MG TABS   Venous stasis - Primary    Pt with a chronic history of bilateral lower leg edema that causes pain Symptoms have worsened over the last few months Symptoms most  consistent with venous insufficiency with dermatitis Dermatitis is unlikely cellulitis Cannot rule out cardiac causes of lower extremity edema given past medical history and risk factors  Plan: Recommend wearing compression stockings Recommend leg elevation when at home Will get an echocardiogram to evaluate heart function Will consider referral to Vascular if symptoms continue to persist       Other Visit Diagnoses    Bilateral lower extremity edema       Relevant Orders   ECHOCARDIOGRAM COMPLETE       No follow-ups on file.      Chipper Herb, Medical Student [facility] [place] [phone] (phone) [phone] (fax)  [place]

## 2019-04-29 NOTE — Assessment & Plan Note (Signed)
Pt with history of aortic valve calcifications seen on CT 2/6 systoic murmur heard best over right upper sternal border  Plan: Will get an echocardiogram to evaluate heart function

## 2019-04-29 NOTE — Assessment & Plan Note (Addendum)
Pt with symptoms of back pain that comes and goes Symptoms most consistent with muscular back spams  Plan: Will start taking baclofen for back pain

## 2019-04-29 NOTE — Assessment & Plan Note (Addendum)
Pt with a chronic history of bilateral lower leg edema that causes pain Symptoms have worsened over the last few months Symptoms most consistent with venous insufficiency with dermatitis Dermatitis is unlikely cellulitis Cannot rule out cardiac causes of lower extremity edema given past medical history and risk factors  Plan: Recommend wearing compression stockings Recommend leg elevation when at home Will get an echocardiogram to evaluate heart function Will consider referral to Vascular if symptoms continue to persist

## 2019-04-29 NOTE — Assessment & Plan Note (Signed)
Pt with a chronic history of bilateral lower leg edema that causes pain Symptoms have worsened over the last few months Symptoms most consistent with venous insufficiency   Cannot rule out cardiac causes of lower extremity edema given past medical history and risk factors  Plan: Recommend wearing compression stockings Recommend leg elevation when at home Will get an echocardiogram to evaluate heart function Will consider referral to Vascular if symptoms continue to persist

## 2019-05-02 ENCOUNTER — Ambulatory Visit: Payer: PPO | Attending: Family Medicine

## 2019-05-03 ENCOUNTER — Telehealth: Payer: Self-pay | Admitting: Family Medicine

## 2019-05-03 NOTE — Chronic Care Management (AMB) (Signed)
Chronic Care Management   Note  05/03/2019 Name: Samantha Henry MRN: [id number] DOB: 1945/01/14  CAMBREIGH DEARING is a 74 y.o. year old female who is a primary care patient of Caryn Section, Kirstie Peri, MD. I reached out to [name] by phone today in response to a referral sent by [name] health plan.     Ms. Din was given information about Chronic Care Management services today including:  1. CCM service includes personalized support from designated clinical staff supervised by her physician, including individualized plan of care and coordination with other care providers 2. 24/7 contact phone numbers for assistance for urgent and routine care needs. 3. Service will only be billed when office clinical staff spend 20 minutes or more in a month to coordinate care. 4. Only one practitioner may furnish and bill the service in a calendar month. 5. The patient may stop CCM services at any time (effective at the end of the month) by phone call to the office staff. 6. The patient will be responsible for cost sharing (co-pay) of up to 20% of the service fee (after annual deductible is met).  Patient agreed to services and verbal consent obtained.   Follow up plan: Telephone appointment with care management team member scheduled for:05/13/2019  Noreene Larsson, [place], [place], [place] [address] Direct Dial: [phone] [email or web address] Website: [email or web address]

## 2019-05-06 ENCOUNTER — Telehealth: Payer: Self-pay | Admitting: Family Medicine

## 2019-05-07 NOTE — Progress Notes (Signed)
Established patient visit   Patient: Samantha Henry   DOB: [birth year]   74 y.o. Female  MRN: [id number] Visit Date: 05/08/2019  I,Sulibeya S Dimas,acting as a scribe for Lavon Paganini, MD.,have documented all relevant documentation on the behalf of Lavon Paganini, MD,as directed by  Lavon Paganini, MD while in the presence of Lavon Paganini, MD.  Today's healthcare provider: Lavon Paganini, MD   Chief Complaint  Patient presents with  . Cough   Subjective    Cough This is a chronic problem. The current episode started 1 to 4 weeks ago. The problem has been gradually worsening. The problem occurs constantly. The cough is productive of sputum. Associated symptoms include nasal congestion, postnasal drip, rhinorrhea, shortness of breath and wheezing. Pertinent negatives include no chest pain, chills, ear congestion, ear pain, fever, headaches, heartburn, myalgias, sore throat, sweats or weight loss. The symptoms are aggravated by lying down. Risk factors for lung disease include smoking/tobacco exposure. She has tried a beta-agonist inhaler, OTC cough suppressant and ipratropium inhaler for the symptoms. The treatment provided mild relief. Her past medical history is significant for COPD and environmental allergies.     Social History   Tobacco Use  . Smoking status: Former Smoker    Packs/day: 1.50    Years: 48.00    Pack years: 72.00    Types: Cigarettes    Quit date: 01/11/2007    Years since quitting: 12.3  . Smokeless tobacco: Never Used  . Tobacco comment: smoked up to 2 ppd since 74 yo. quit in 2009  Substance Use Topics  . Alcohol use: No    Alcohol/week: 0.0 standard drinks  . Drug use: No       Medications: Outpatient Medications Prior to Visit  Medication Sig  . acetaminophen (TYLENOL) 650 MG CR tablet Take 650 mg by mouth every 8 (eight) hours as needed for pain.  Marland Kitchen albuterol (ACCUNEB) 0.63 MG/3ML nebulizer solution Inhale into the lungs. Take  3 mLs (0.63 mg total) by nebulization every 6 (six) hours as needed for Wheezing  . Baclofen 5 MG TABS Take 5 mg by mouth 3 (three) times daily as needed.  . Blood Glucose Monitoring Suppl (ONE TOUCH ULTRA 2) w/Device KIT Use daily to check blood sugar for type 2 diabetes E11.9  . calcium carbonate (CALCIUM 600) 600 MG TABS tablet Take 500 mg by mouth 2 (two) times daily with a meal.   . calcium carbonate (TUMS EX) 750 MG chewable tablet Chew 2 tablets by mouth 2 (two) times daily as needed for heartburn.  . cholecalciferol (VITAMIN D) 400 units TABS tablet Take 400 Units by mouth daily.  Marland Kitchen donepezil (ARICEPT) 5 MG tablet Take 1 tablet (5 mg total) by mouth at bedtime.  . furosemide (LASIX) 20 MG tablet Take 40 mg by mouth daily at 2 PM.   . levothyroxine (SYNTHROID) 125 MCG tablet TAKE 1 TABLET BY MOUTH ONCE DAILY BEFORE BREAKFAST  . losartan (COZAAR) 50 MG tablet Take 1 tablet by mouth once daily  . metFORMIN (GLUCOPHAGE-XR) 500 MG 24 hr tablet TAKE 2 TABLETS BY MOUTH IN THE MORNING ,THEN  2  TABLETS  IN  THE  EVENING  . montelukast (SINGULAIR) 10 MG tablet Take 10 mg by mouth at bedtime.  . MULTIPLE VITAMIN PO Take 1 tablet by mouth daily.  . Omega-3 Fatty Acids (FISH OIL BURP-LESS) 1000 MG CAPS Take 1 tablet by mouth 2 (two) times daily.   Glory Rosebush ULTRA test strip  USE TEST STRIP(S) TO CHECK BLOOD SUGAR DAILY FOR TYPE 2 DIABETES  . OXYGEN Inhale 3 L into the lungs. Used at night and PRN during the day  . potassium chloride SA (KLOR-CON) 20 MEQ tablet Take 1 tablet (20 mEq total) by mouth daily.  . RABEprazole (ACIPHEX) 20 MG tablet Take 1 tablet (20 mg total) by mouth daily.  . rosuvastatin (CRESTOR) 20 MG tablet Take 1 tablet by mouth once daily  . trolamine salicylate (ASPERCREME) 10 % cream Apply 1 application topically as needed for muscle pain.  . vitamin B-12 (CYANOCOBALAMIN) 50 MCG tablet Take 50 mcg by mouth daily.  . [DISCONTINUED] albuterol (PROVENTIL HFA;VENTOLIN HFA) 108 (90  Base) MCG/ACT inhaler Inhale 2 puffs into the lungs every 6 (six) hours as needed for wheezing or shortness of breath.  . [DISCONTINUED] SYMBICORT 160-4.5 MCG/ACT inhaler SMARTSIG:2 Puff(s) By Mouth Twice Daily  . [DISCONTINUED] Tiotropium Bromide Monohydrate (SPIRIVA RESPIMAT) 1.25 MCG/ACT AERS Inhale 1 spray into the lungs every morning.  Marland Kitchen aspirin 81 MG tablet Take 1 tablet by mouth daily.   No facility-administered medications prior to visit.    Review of Systems  Constitutional: Negative for chills, fever and weight loss.  HENT: Positive for postnasal drip and rhinorrhea. Negative for ear pain and sore throat.   Respiratory: Positive for cough, shortness of breath and wheezing.   Cardiovascular: Negative for chest pain.  Gastrointestinal: Negative for heartburn.  Musculoskeletal: Negative for myalgias.  Allergic/Immunologic: Positive for environmental allergies.  Neurological: Negative for headaches.    Last CBC Lab Results  Component Value Date   WBC 4.3 01/01/2019   HGB 10.3 (L) 01/01/2019   HCT 33.0 (L) 01/01/2019   MCV 83 01/01/2019   MCH 25.8 (L) 01/01/2019   RDW 18.2 (H) 01/01/2019   PLT 114 (L) 00/76/2263   Last metabolic panel Lab Results  Component Value Date   GLUCOSE 113 (H) 01/01/2019   NA 138 01/01/2019   K 3.0 (L) 01/01/2019   CL 97 01/01/2019   CO2 27 01/01/2019   BUN 12 01/01/2019   CREATININE 0.49 (L) 01/01/2019   GFRNONAA 98 01/01/2019   GFRAA 113 01/01/2019   CALCIUM 9.5 01/01/2019   PHOS 2.6 (L) 01/01/2019   PROT 6.2 11/23/2018   ALBUMIN 4.2 01/01/2019   LABGLOB 2.3 11/23/2018   AGRATIO 1.7 11/23/2018   BILITOT 0.3 11/23/2018   ALKPHOS 51 11/23/2018   AST 15 11/23/2018   ALT 13 11/23/2018   ANIONGAP 9 06/08/2018       Objective    BP 108/62 (BP Location: Left Arm, Patient Position: Sitting, Cuff Size: Large)   Pulse 64   Temp (!) 97.3 F (36.3 C) (Temporal)   Resp 16   Wt 187 lb (84.8 kg)   SpO2 97%   BMI 29.29 kg/m  BP  Readings from Last 3 Encounters:  05/08/19 108/62  04/29/19 (!) 125/55  03/22/19 129/64   Wt Readings from Last 3 Encounters:  05/08/19 187 lb (84.8 kg)  04/29/19 187 lb (84.8 kg)  03/22/19 180 lb (81.6 kg)      Physical Exam Vitals reviewed.  Constitutional:      General: She is not in acute distress.    Appearance: She is well-developed.  HENT:     Head: Normocephalic and atraumatic.  Eyes:     General: No scleral icterus.    Conjunctiva/sclera: Conjunctivae normal.  Cardiovascular:     Rate and Rhythm: Normal rate and regular rhythm.     Heart  sounds: Murmur present.  Pulmonary:     Effort: Pulmonary effort is normal. No respiratory distress.     Breath sounds: No wheezing or rhonchi.  Abdominal:     Palpations: Abdomen is soft.     Tenderness: There is no abdominal tenderness.  Musculoskeletal:     Right lower leg: Edema (trace) present.     Left lower leg: Edema (trace) present.  Skin:    General: Skin is warm and dry.     Findings: No rash.     Comments: Plantar warts on base of R foot  Neurological:     Mental Status: She is alert and oriented to person, place, and time. Mental status is at baseline.  Psychiatric:        Behavior: Behavior normal.      No results found for any visits on 05/08/19.  Assessment & Plan     Problem List Items Addressed This Visit      Respiratory   COPD (chronic obstructive pulmonary disease) ([place])    Worsening cough overnight Benign exam today Discontinue SYMBICORT and SPIRIVA RESPIMAT Will add Terelgy one puff once a day Continue Albuterol Add Flonase daily Keep follow up with Dr. Raul Del        Relevant Medications   Fluticasone-Umeclidin-Vilant (TRELEGY ELLIPTA) 100-62.5-25 MCG/INH AEPB   fluticasone (FLONASE) 50 MCG/ACT nasal spray   albuterol (VENTOLIN HFA) 108 (90 Base) MCG/ACT inhaler   Allergic rhinitis - Primary    Start Flonase  Try OTC Zyrtec Postnasal drip may be contributing to cough overnight          Musculoskeletal and Integument   Plantar wart    New problem Referral to podiatry Avoid trying to remove them at home for risk of infection in the setting of T2DM      Relevant Orders   Ambulatory referral to Podiatry       Return in about 3 months (around 08/07/2019) for chronic disease f/u.      I, Lavon Paganini, MD, have reviewed all documentation for this visit. The documentation on 05/08/19 for the exam, diagnosis, procedures, and orders are all accurate and complete.   Aldona Bryner, Dionne Bucy, MD, MPH [facility]

## 2019-05-08 ENCOUNTER — Other Ambulatory Visit: Payer: Self-pay

## 2019-05-08 ENCOUNTER — Encounter: Payer: Self-pay | Admitting: Family Medicine

## 2019-05-08 ENCOUNTER — Ambulatory Visit (INDEPENDENT_AMBULATORY_CARE_PROVIDER_SITE_OTHER): Payer: PPO | Admitting: Family Medicine

## 2019-05-08 VITALS — BP 108/62 | HR 64 | Temp 97.3°F | Resp 16 | Wt 187.0 lb

## 2019-05-08 DIAGNOSIS — J302 Other seasonal allergic rhinitis: Secondary | ICD-10-CM | POA: Diagnosis not present

## 2019-05-08 DIAGNOSIS — J449 Chronic obstructive pulmonary disease, unspecified: Secondary | ICD-10-CM

## 2019-05-08 DIAGNOSIS — B070 Plantar wart: Secondary | ICD-10-CM | POA: Diagnosis not present

## 2019-05-08 MED ORDER — TRELEGY ELLIPTA 100-62.5-25 MCG/INH IN AEPB: 1.0000 | INHALATION_SPRAY | Freq: Every day | RESPIRATORY_TRACT | 0 refills | Status: DC

## 2019-05-08 MED ORDER — ALBUTEROL SULFATE HFA 108 (90 BASE) MCG/ACT IN AERS: 2.0000 | INHALATION_SPRAY | Freq: Four times a day (QID) | RESPIRATORY_TRACT | 3 refills | Status: DC | PRN

## 2019-05-08 MED ORDER — FLUTICASONE PROPIONATE 50 MCG/ACT NA SUSP: 2.0000 | Freq: Every day | NASAL | 6 refills | Status: AC

## 2019-05-08 NOTE — Patient Instructions (Signed)
Use Zyrtec and Mucinex over the counter.

## 2019-05-08 NOTE — Assessment & Plan Note (Addendum)
Start Flonase  Try OTC Zyrtec Postnasal drip may be contributing to cough overnight

## 2019-05-08 NOTE — Assessment & Plan Note (Addendum)
Worsening cough overnight Benign exam today Discontinue SYMBICORT and SPIRIVA RESPIMAT Will add Terelgy one puff once a day Continue Albuterol Add Flonase daily Keep follow up with Dr. Raul Del

## 2019-05-08 NOTE — Assessment & Plan Note (Signed)
New problem Referral to podiatry Avoid trying to remove them at home for risk of infection in the setting of T2DM

## 2019-05-13 ENCOUNTER — Telehealth: Payer: PPO

## 2019-05-13 ENCOUNTER — Other Ambulatory Visit: Payer: Self-pay | Admitting: Family Medicine

## 2019-05-14 ENCOUNTER — Telehealth: Payer: Self-pay | Admitting: Family Medicine

## 2019-05-14 NOTE — Chronic Care Management (AMB) (Signed)
  Chronic Care Management   Note  05/14/2019 Name: [name] MRN: [id number] DOB: 1945-09-14  Samantha Henry is a 74 y.o. year old female who is a primary care patient of Brita Romp, Dionne Bucy, MD and is actively engaged with the care management team. I reached out to [name] by phone today to assist with re-scheduling an initial visit with the Pharmacist  Follow up plan: Unsuccessful telephone outreach attempt made. A HIPPA compliant phone message was left for the patient providing contact information and requesting a return call. The care management team will reach out to the patient again over the next 7 days.  If patient returns call to provider office, please advise to call [facility] at [phone].  [place], [place] [id number] Direct Dial: [phone] [email or web address] Website: [email or web address]

## 2019-05-15 NOTE — Chronic Care Management (AMB) (Signed)
  Chronic Care Management   Note  05/15/2019 Name: [name] MRN: [id number] DOB: [birth year]  Samantha Henry is a 74 y.o. year old female who is a primary care patient of Brita Romp, Dionne Bucy, MD and is actively engaged with the care management team. I reached out to [name] by phone today to assist with scheduling an initial visit with the Pharmacist  Follow up plan: Telephone appointment with care management team member scheduled for:06/17/2019. Patients daughter does not get off till 1430 made telephone appointment at [address]andy is aware.   [place], [place] [address] Direct Dial: [phone] [email or web address] Website: [email or web address]

## 2019-05-23 ENCOUNTER — Other Ambulatory Visit: Payer: Self-pay | Admitting: Family Medicine

## 2019-05-23 NOTE — Telephone Encounter (Signed)
Requested medication (s) are due for refill today: yes  Requested medication (s) are on the active medication list: yes  Last refill:  4/28 21  #60  0 refills  Future visit scheduled:yes  Notes to clinic: Med not assigned protocol    Requested Prescriptions  Pending Prescriptions Disp [place] 100-62.5-25 MCG/INH AEPB [Pharmacy Med Name: Trelegy Ellipta 100-62.5-25 MCG/INH Inhalation Aerosol Powder Breath Activated] 60 each 0    Sig: Inhale 1 puff by mouth once daily      Off-Protocol Failed - 05/23/2019  4:57 PM      Failed - Medication not assigned to a protocol, review manually.      Passed - Valid encounter within last 12 months    Recent Outpatient Visits           2 weeks ago Seasonal allergic rhinitis, unspecified trigger   [facility] [place], Dionne Bucy, MD   3 weeks ago Venous stasis dermatitis of both lower extremities   [facility] [place], Dionne Bucy, MD   4 months ago Iron deficiency anemia, unspecified iron deficiency anemia type   [facility] Birdie Sons, MD   5 months ago Lower extremity edema   [facility] [place], Dionne Bucy, MD   6 months ago Annual physical exam   [facility] Birdie Sons, MD       Future Appointments             In 2 months Bacigalupo, Dionne Bucy, MD [facility], [place]

## 2019-05-27 DIAGNOSIS — J449 Chronic obstructive pulmonary disease, unspecified: Secondary | ICD-10-CM | POA: Diagnosis not present

## 2019-06-13 ENCOUNTER — Other Ambulatory Visit: Payer: Self-pay | Admitting: Family Medicine

## 2019-06-17 ENCOUNTER — Ambulatory Visit: Payer: PPO | Admitting: Pharmacist

## 2019-06-17 ENCOUNTER — Other Ambulatory Visit: Payer: Self-pay

## 2019-06-17 DIAGNOSIS — J449 Chronic obstructive pulmonary disease, unspecified: Secondary | ICD-10-CM

## 2019-06-17 DIAGNOSIS — E7800 Pure hypercholesterolemia, unspecified: Secondary | ICD-10-CM

## 2019-06-17 NOTE — Chronic Care Management (AMB) (Signed)
Chronic Care Management Pharmacy  Name: Samantha Henry  MRN: [id number] DOB: [birth year]  Chief Complaint/ HPI  Samantha Henry,  74 y.o. , female presents for their Initial CCM visit with the clinical pharmacist via telephone due to COVID-19 Pandemic.  PCP : Samantha Crews, MD  Their chronic conditions include: COPD, HLD, Rhinitis  Office Visits: 4/28 Rhinitis, Bacigalupo, BP 108/62 P 64 Wt 187 BMI 29.29, chronic cough, d/c Symbicort and Spiriva, start Trelegy 4/19 venostasis, Bacigalupo, BP Consult Visit: NA  Medications: Outpatient Encounter Medications as of 06/17/2019  Medication Sig  . acetaminophen (TYLENOL) 650 MG CR tablet Take 650 mg by mouth every 8 (eight) hours as needed for pain.  Marland Kitchen albuterol (ACCUNEB) 0.63 MG/3ML nebulizer solution Inhale into the lungs. Take 3 mLs (0.63 mg total) by nebulization every 6 (six) hours as needed for Wheezing  . albuterol (VENTOLIN HFA) 108 (90 Base) MCG/ACT inhaler Inhale 2 puffs into the lungs every 6 (six) hours as needed for wheezing or shortness of breath.  Marland Kitchen aspirin 81 MG tablet Take 1 tablet by mouth daily.  . Baclofen 5 MG TABS Take 5 mg by mouth 3 (three) times daily as needed.  . Blood Glucose Monitoring Suppl (ONE TOUCH ULTRA 2) w/Device KIT Use daily to check blood sugar for type 2 diabetes E11.9  . calcium carbonate (CALCIUM 600) 600 MG TABS tablet Take 500 mg by mouth 2 (two) times daily with a meal.   . calcium carbonate (TUMS EX) 750 MG chewable tablet Chew 2 tablets by mouth 2 (two) times daily as needed for heartburn.  . cholecalciferol (VITAMIN D) 400 units TABS tablet Take 400 Units by mouth daily.  Marland Kitchen donepezil (ARICEPT) 5 MG tablet Take 1 tablet (5 mg total) by mouth at bedtime.  . fluticasone (FLONASE) 50 MCG/ACT nasal spray Place 2 sprays into both nostrils daily.  . furosemide (LASIX) 20 MG tablet Take 40 mg by mouth daily at 2 PM.   . levothyroxine (SYNTHROID) 125 MCG tablet TAKE 1 TABLET BY MOUTH ONCE DAILY  BEFORE BREAKFAST  . losartan (COZAAR) 50 MG tablet Take 1 tablet by mouth once daily  . metFORMIN (GLUCOPHAGE-XR) 500 MG 24 hr tablet TAKE 2 TABLETS BY MOUTH IN THE MORNING AND 2 TABLETS IN THE EVENING  . montelukast (SINGULAIR) 10 MG tablet Take 10 mg by mouth at bedtime.  . MULTIPLE VITAMIN PO Take 1 tablet by mouth daily.  . Omega-3 Fatty Acids (FISH OIL BURP-LESS) 1000 MG CAPS Take 1 tablet by mouth 2 (two) times daily.   Samantha Henry ULTRA test strip USE TEST STRIP(S) TO CHECK BLOOD SUGAR DAILY FOR TYPE 2 DIABETES  . OXYGEN Inhale 3 L into the lungs. Used at night and PRN during the day  . potassium chloride SA (KLOR-CON) 20 MEQ tablet Take 1 tablet (20 mEq total) by mouth daily.  . RABEprazole (ACIPHEX) 20 MG tablet Take 1 tablet (20 mg total) by mouth daily.  . rosuvastatin (CRESTOR) 20 MG tablet Take 1 tablet by mouth once daily  . TRELEGY ELLIPTA 100-62.5-25 MCG/INH AEPB Inhale 1 puff by mouth once daily  . trolamine salicylate (ASPERCREME) 10 % cream Apply 1 application topically as needed for muscle pain.  . vitamin B-12 (CYANOCOBALAMIN) 50 MCG tablet Take 50 mcg by mouth daily.   No facility-administered encounter medications on file as of 06/17/2019.     Tobacco Use: Medium Risk  . Smoking Tobacco Use: Former Smoker  . Smokeless Tobacco Use: Never Used  Current Diagnosis/Assessment:  Goals Addressed   None      COPD / Asthma / Tobacco   Eosinophil count:   Lab Results  Component Value Date/Time   EOSPCT 9 06/08/2018 07:25 PM  %                               Eos (Absolute):  Lab Results  Component Value Date/Time   EOSABS 0.6 (H) 06/08/2018 07:25 PM    Tobacco Status:  Social History   Tobacco Use  Smoking Status Former Smoker  . Packs/day: 1.50  . Years: 48.00  . Pack years: 72.00  . Types: Cigarettes  . Quit date: 01/11/2007  . Years since quitting: 12.4  Smokeless Tobacco Never Used  Tobacco Comment   smoked up to 2 ppd since 74 yo. quit in 2009     Patient has failed these meds in past: Spiriva, Symbicort Patient is currently uncontrolled on the following medications: Trelegy, albuterol SVN, Zyrtec Using maintenance inhaler regularly? Yes Frequency of rescue inhaler use:  daily  We discussed:   Symptoms better on Trelegy? Yes Albuterol SVN bid Afrin addicted MVI generic One a Day  Plan  D/c Afrin and phenylephrine NS  Heart Failure   Currently treating venostasis.  Patient has failed these meds in past: NA Patient is currently controlled on the following medications: losartan, furosemide, KCl  We discussed: Red spot on her leg Needs potassium level Stopped aspirin due to HCP saying different things   Plan Take ASA daily! Order CMP to check potassium, etc  Hyperlipidemia   Lipid Panel     Component Value Date/Time   CHOL 145 11/23/2018 1110   TRIG 53 11/23/2018 1110   HDL 70 11/23/2018 1110   LDLCALC 64 11/23/2018 1110   LDLCALC 65 12/23/2016 1141     The 10-year ASCVD risk score Mikey Bussing DC Jr., et al., 2013) is: 18.9%   Values used to calculate the score:     Age: 74 years     Sex: Female     Is Non-Hispanic African American: No     Diabetic: Yes     Tobacco smoker: No     Systolic Blood Pressure: 786 mmHg     Is BP treated: Yes     HDL Cholesterol: 70 mg/dL     Total Cholesterol: 145 mg/dL   Patient has failed these meds in past: NA Patient is currently controlled on the following medications:  . Crestor 17m daily  We discussed:    At goal LDL < 70  Plan  Continue current medications  Medication Management   [place] [facility] Uses pill box? Yes Pt endorses 100% compliance Have family that works at [employer] We discussed:  Baclofen 548mtwice daily Calcium 60081m 800iu D twice daily Vitamin D 1000 iu daily (still taking 400) Tums rare Takes robitussin twice daily 2 - 3 sips  Plan  Get compression stockings refitted Increase Aricept  [id number] current medication management strategy  Follow up: 3 month phone visit  Samantha [place]igua Milus HeightarmD, BCGPPrenticeTS[place]-[phone]

## 2019-06-18 ENCOUNTER — Other Ambulatory Visit: Payer: Self-pay | Admitting: Family Medicine

## 2019-06-18 NOTE — Patient Instructions (Addendum)
Visit Information  Goals Addressed            This Visit's Progress   . Patient Stated       CARE PLAN ENTRY  Current Barriers:  . Chronic Disease Management support, education, and care coordination needs related to Hyperlipidemia and COPD   Hyperlipidemia . Pharmacist Clinical Goal(s): o Over the next 90 days, patient will work with PharmD and providers to maintain LDL goal < 70 . Current regimen:  o Crestor 20mg  daily . Interventions: o None . Patient self care activities - Over the next 90 days, patient will: o Continue current lifestyle modifications to support statin treatment  COPD . Pharmacist Clinical Goal(s) o Over the next 30 days, patient will work with PharmD and providers to end Afrin physical dependence o Achieve breathing control using daily maintenance inhalers such as Trelegy . Current regimen:  o Trelegy daily, albuterol nebulizer twice daily, rescue inhaler at least daily . Interventions: o Stop all Afrin (oxymetazoline) related nasal sprays o Try to reduce rescue inhaler use if possible, you may be experiencing rebound reactive airway disease from overuse of albuterol . Patient self care activities - Over the next 30 days, patient will: o Replace Afrin with [place] and Flonase (fluticasone nasal spray)  Medication management . Pharmacist Clinical Goal(s): o Over the next 90 days, patient will work with PharmD and providers to achieve optimal medication adherence . Current pharmacy: [facility] . Interventions o Comprehensive medication review performed. o Continue current medication management strategy . Patient self care activities - Over the next 90 days, patient will: o Focus on medication adherence by using nasal sprays as directed o Take medications as prescribed o Report any questions or concerns to PharmD and/or provider(s)  Initial goal documentation        Samantha Henry was given information about Chronic Care Management services today  including:  1. CCM service includes personalized support from designated clinical staff supervised by her physician, including individualized plan of care and coordination with other care providers 2. 24/7 contact phone numbers for assistance for urgent and routine care needs. 3. Standard insurance, coinsurance, copays and deductibles apply for chronic care management only during months in which we provide at least 20 minutes of these services. Most insurances cover these services at 100%, however patients may be responsible for any copay, coinsurance and/or deductible if applicable. This service may help you avoid the need for more expensive face-to-face services. 4. Only one practitioner may furnish and bill the service in a calendar month. 5. The patient may stop CCM services at any time (effective at the end of the month) by phone call to the office staff.  Patient agreed to services and verbal consent obtained.   Print copy of patient instructions provided.  Telephone follow up appointment with pharmacy team member scheduled for: 3 months  Milus Height, PharmD, [place], CTTS Clinical Pharmacist [facility] [phone]  Chronic Obstructive Pulmonary Disease Chronic obstructive pulmonary disease (COPD) is a long-term (chronic) lung problem. When you have COPD, it is hard for air to get in and out of your lungs. Usually the condition gets worse over time, and your lungs will never return to normal. There are things you can do to keep yourself as healthy as possible.  Your doctor may treat your condition with: ? Medicines. ? Oxygen. ? Lung surgery.  Your doctor may also recommend: ? Rehabilitation. This includes steps to make your body work better. It may involve a team of specialists. ?  Quitting smoking, if you smoke. ? Exercise and changes to your diet. ? Comfort measures (palliative care). Follow these instructions at home: Medicines  Take over-the-counter and  prescription medicines only as told by your doctor.  Talk to your doctor before taking any cough or allergy medicines. You may need to avoid medicines that cause your lungs to be dry. Lifestyle  If you smoke, stop. Smoking makes the problem worse. If you need help quitting, ask your doctor.  Avoid being around things that make your breathing worse. This may include smoke, chemicals, and fumes.  Stay active, but remember to rest as well.  Learn and use tips on how to relax.  Make sure you get enough sleep. Most adults need at least 7 hours of sleep every night.  Eat healthy foods. Eat smaller meals more often. Rest before meals. Controlled breathing Learn and use tips on how to control your breathing as told by your doctor. Try:  Breathing in (inhaling) through your nose for 1 second. Then, pucker your lips and breath out (exhale) through your lips for 2 seconds.  Putting one hand on your belly (abdomen). Breathe in slowly through your nose for 1 second. Your hand on your belly should move out. Pucker your lips and breathe out slowly through your lips. Your hand on your belly should move in as you breathe out.  Controlled coughing Learn and use controlled coughing to clear mucus from your lungs. Follow these steps: 1. Lean your head a little forward. 2. Breathe in deeply. 3. Try to hold your breath for 3 seconds. 4. Keep your mouth slightly open while coughing 2 times. 5. Spit any mucus out into a tissue. 6. Rest and do the steps again 1 or 2 times as needed. General instructions  Make sure you get all the shots (vaccines) that your doctor recommends. Ask your doctor about a flu shot and a pneumonia shot.  Use oxygen therapy and pulmonary rehabilitation if told by your doctor. If you need home oxygen therapy, ask your doctor if you should buy a tool to measure your oxygen level (oximeter).  Make a COPD action plan with your doctor. This helps you to know what to do if you feel  worse than usual.  Manage any other conditions you have as told by your doctor.  Avoid going outside when it is very hot, cold, or humid.  Avoid people who have a sickness you can catch (contagious).  Keep all follow-up visits as told by your doctor. This is important. Contact a doctor if:  You cough up more mucus than usual.  There is a change in the color or thickness of the mucus.  It is harder to breathe than usual.  Your breathing is faster than usual.  You have trouble sleeping.  You need to use your medicines more often than usual.  You have trouble doing your normal activities such as getting dressed or walking around the house. Get help right away if:  You have shortness of breath while resting.  You have shortness of breath that stops you from: ? Being able to talk. ? Doing normal activities.  Your chest hurts for longer than 5 minutes.  Your skin color is more blue than usual.  Your pulse oximeter shows that you have low oxygen for longer than 5 minutes.  You have a fever.  You feel too tired to breathe normally. Summary  Chronic obstructive pulmonary disease (COPD) is a long-term lung problem.  The way your  lungs work will never return to normal. Usually the condition gets worse over time. There are things you can do to keep yourself as healthy as possible.  Take over-the-counter and prescription medicines only as told by your doctor.  If you smoke, stop. Smoking makes the problem worse. This information is not intended to replace advice given to you by your health care provider. Make sure you discuss any questions you have with your health care provider. Document Revised: 12/09/2016 Document Reviewed: 02/01/2016 Elsevier Patient Education  2020 [employer].

## 2019-06-18 NOTE — Telephone Encounter (Signed)
Requested medication (s) are due for refill today: yes  Requested medication (s) are on the active medication list: yes  Last refill: 04/29/2019   #30   0 refills  Future visit scheduled yes 08/07/2019  Notes to clinic: not delegated  Requested Prescriptions  Pending Prescriptions Disp Refills   Baclofen 5 MG TABS [Pharmacy Med Name: Baclofen 5 MG Oral Tablet] 30 tablet 0    Sig: Take 1 tablet by mouth three times daily as needed      Not Delegated - Analgesics:  Muscle Relaxants Failed - 06/18/2019 12:55 PM      Failed - This refill cannot be delegated      Passed - Valid encounter within last 6 months    Recent Outpatient Visits           1 month ago Seasonal allergic rhinitis, unspecified trigger   [facility] [place], Dionne Bucy, MD   1 month ago Venous stasis dermatitis of both lower extremities   [facility] [place], Dionne Bucy, MD   5 months ago Iron deficiency anemia, unspecified iron deficiency anemia type   [facility] Birdie Sons, MD   6 months ago Lower extremity edema   [facility] [place], Dionne Bucy, MD   6 months ago Annual physical exam   [facility] Birdie Sons, MD       Future Appointments             In 1 month Bacigalupo, Dionne Bucy, MD [facility], [place]

## 2019-06-27 ENCOUNTER — Other Ambulatory Visit: Payer: Self-pay | Admitting: Family Medicine

## 2019-06-27 DIAGNOSIS — E876 Hypokalemia: Secondary | ICD-10-CM

## 2019-07-01 ENCOUNTER — Other Ambulatory Visit: Payer: Self-pay | Admitting: Family Medicine

## 2019-07-01 NOTE — Telephone Encounter (Signed)
Requested medication (s) are due for refill today: yes  Requested medication (s) are on the active medication list: yes  Last refill:  06/18/19  #30  0 refills  Future visit scheduled: yes  Notes to clinic:  Medication not delegated    Requested Prescriptions  Pending Prescriptions Disp Refills   Baclofen 5 MG TABS [Pharmacy Med Name: Baclofen 5 MG Oral Tablet] 30 tablet 0    Sig: Take 1 tablet by mouth three times daily as needed      Not Delegated - Analgesics:  Muscle Relaxants Failed - 07/01/2019 10:22 AM      Failed - This refill cannot be delegated      Passed - Valid encounter within last 6 months    Recent Outpatient Visits           1 month ago Seasonal allergic rhinitis, unspecified trigger   [facility] [place], Dionne Bucy, MD   2 months ago Venous stasis dermatitis of both lower extremities   [facility] [place], Dionne Bucy, MD   6 months ago Iron deficiency anemia, unspecified iron deficiency anemia type   [facility] Birdie Sons, MD   6 months ago Lower extremity edema   [facility] [place], Dionne Bucy, MD   7 months ago Annual physical exam   [facility] Birdie Sons, MD       Future Appointments             In 1 month Bacigalupo, Dionne Bucy, MD [facility], [place]

## 2019-07-05 ENCOUNTER — Other Ambulatory Visit: Payer: Self-pay

## 2019-07-05 ENCOUNTER — Ambulatory Visit: Payer: Medicare Other | Admitting: Podiatry

## 2019-07-07 ENCOUNTER — Other Ambulatory Visit: Payer: Self-pay | Admitting: Family Medicine

## 2019-07-07 NOTE — Telephone Encounter (Signed)
Requested medication (s) are due for refill today: yes  Requested medication (s) are on the active medication list: yes  Last refill:  05/24/19  Future visit scheduled: yes  Notes to clinic:  med not assigned to a protocol   Requested Prescriptions  Pending Prescriptions Disp Refills   [place] 100-62.5-25 MCG/INH AEPB [Pharmacy Med Name: Trelegy Ellipta 100-62.5-25 MCG/INH Inhalation Aerosol Powder Breath Activated] 60 each 0    Sig: Inhale 1 puff by mouth once daily      Off-Protocol Failed - 07/07/2019  3:18 PM      Failed - Medication not assigned to a protocol, review manually.      Passed - Valid encounter within last 12 months    Recent Outpatient Visits           2 months ago Seasonal allergic rhinitis, unspecified trigger   [facility] [place], Dionne Bucy, MD   2 months ago Venous stasis dermatitis of both lower extremities   [facility] [place], Dionne Bucy, MD   6 months ago Iron deficiency anemia, unspecified iron deficiency anemia type   [facility] Birdie Sons, MD   6 months ago Lower extremity edema   [facility] [place], Dionne Bucy, MD   7 months ago Annual physical exam   [facility] Birdie Sons, MD       Future Appointments             In 1 month Bacigalupo, Dionne Bucy, MD [facility], [place]

## 2019-07-12 ENCOUNTER — Telehealth: Payer: Self-pay

## 2019-07-12 NOTE — Telephone Encounter (Signed)
Dr. Caryn Section is not here next week. Appointment scheduled for Tuesday 07/16/2019 with Simona Huh.

## 2019-07-12 NOTE — Telephone Encounter (Signed)
Copied from [place] [id number]. Topic: General - Other >> Jul 12, 2019 11:01 AM Jodie Echevaria wrote: Reason for CRM: Patient daughter Samantha Henry called to request an appointment with Dr Caryn Section if that is ok with him and since Dr B is out. Per [name] patient is having pain in  her legs again. States that she would like something for this upcoming week. Not available on these dates 7/12/-13/and the 20 th. Please call Ph#   [phone]

## 2019-07-16 ENCOUNTER — Ambulatory Visit (INDEPENDENT_AMBULATORY_CARE_PROVIDER_SITE_OTHER): Payer: Medicare Other | Admitting: Family Medicine

## 2019-07-16 ENCOUNTER — Encounter: Payer: Self-pay | Admitting: Family Medicine

## 2019-07-16 ENCOUNTER — Other Ambulatory Visit: Payer: Self-pay

## 2019-07-16 VITALS — BP 104/57 | HR 69 | Temp 96.9°F | Ht 66.0 in | Wt 182.6 lb

## 2019-07-16 DIAGNOSIS — I872 Venous insufficiency (chronic) (peripheral): Secondary | ICD-10-CM | POA: Diagnosis not present

## 2019-07-16 DIAGNOSIS — R600 Localized edema: Secondary | ICD-10-CM | POA: Diagnosis not present

## 2019-07-16 DIAGNOSIS — E7800 Pure hypercholesterolemia, unspecified: Secondary | ICD-10-CM

## 2019-07-16 DIAGNOSIS — K921 Melena: Secondary | ICD-10-CM | POA: Diagnosis not present

## 2019-07-16 DIAGNOSIS — E1149 Type 2 diabetes mellitus with other diabetic neurological complication: Secondary | ICD-10-CM

## 2019-07-16 DIAGNOSIS — C3490 Malignant neoplasm of unspecified part of unspecified bronchus or lung: Secondary | ICD-10-CM

## 2019-07-16 DIAGNOSIS — R413 Other amnesia: Secondary | ICD-10-CM

## 2019-07-16 MED ORDER — AMOXICILLIN 875 MG PO TABS: 875.0000 mg | ORAL_TABLET | Freq: Two times a day (BID) | ORAL | 0 refills | Status: DC

## 2019-07-16 NOTE — Progress Notes (Signed)
Established patient visit   Patient: Samantha Henry   DOB: [birth year]   74 y.o. Female  MRN: [id number] Visit Date: 07/16/2019  Today's healthcare provider: Vernie Murders, PA   Chief Complaint  Patient presents with   Leg Swelling   Subjective    HPI   Patient presents today in office for swelling and redness in both legs, patient says that this has been going on for years. Patient was last seen for this on 4/19 by PCP. PCP's last office note said that she saw no sign of DVT and that patients swelling was chronic and asymmetric.   Patient would like to know if she could have a refill on baclofen because her back is still bothering her.   Patient needs labs to check A1C, Iron and Vitamin D.   Pharmacist says that Daughter needed to ask about increasing dose of Aricept to 10 MG.  Patient also says that she was told that she has a calcium pocket on her right elbow and she would like to have it removed.   Past Medical History:  Diagnosis Date   Arthritis    osteo   Cancer ([place]) 1969   CERVICAL CA   COPD (chronic obstructive pulmonary disease) ([place])    Diabetes mellitus without complication (HCC)    Diabetic peripheral neuropathy (HCC)    Environmental and seasonal allergies    GERD (gastroesophageal reflux disease)    History of cervical cancer    History of colon polyps    Hypercholesteremia    Hypertension    Hypothyroidism    Nail dystrophy    Neuropathy    Proteinuria    Restless legs syndrome    Sleep apnea    TIA (transient ischemic attack)    Von Willebrand's disease [facility])    Past Surgical History:  Procedure Laterality Date   ABDOMINAL HYSTERECTOMY  1969   due to cervical cancer    APPENDECTOMY     BLADDER SURGERY  1997   bladder tacking   BREAST BIOPSY Left 01/21/2015   FIBROADENOMATOUS CHANGE WITH ASSOCIATED MICROCALCIFICATIONS   CARDIAC CATHETERIZATION     CARPAL TUNNEL RELEASE  2010   colonoscopy and upper endoscopy'     COLONOSCOPY WITH  PROPOFOL N/A 02/25/2016   Procedure: COLONOSCOPY WITH PROPOFOL;  Surgeon: Lollie Sails, MD;  Location[facility]COPY;  Service: Endoscopy;  Laterality: N/A;   COLONOSCOPY WITH PROPOFOL N/A 03/22/2019   Procedure: COLONOSCOPY WITH PROPOFOL;  Surgeon: Robert Bellow, MD;  Location: [facility] ENDOSCOPY;  Service: Endoscopy;  Laterality: N/A;   ESOPHAGOGASTRODUODENOSCOPY (EGD) WITH PROPOFOL N/A 03/22/2019   Procedure: ESOPHAGOGASTRODUODENOSCOPY (EGD) WITH PROPOFOL;  Surgeon: Robert Bellow, MD;  Location: [facility] ENDOSCOPY;  Service: Endoscopy;  Laterality: N/A;   JOINT REPLACEMENT Left 06/14/2016   KNEE   MUSCLE BIOPSY     OOPHORECTOMY     TENDON RELEASE  2010   right foot, Dr. Vickki Muff    TOTAL KNEE ARTHROPLASTY Left 06/14/2016   Procedure: TOTAL KNEE ARTHROPLASTY;  Surgeon: Corky Mull, MD;  Location: [facility] ORS;  Service: Orthopedics;  Laterality: Left;   Social History   Tobacco Use   Smoking status: Former    Packs/day: 1.50    Years: 48.00    Pack years: 72.00    Types: Cigarettes    Quit date: 01/11/2007    Years since quitting: 13.7   Smokeless tobacco: Never   Tobacco comments:    smoked up to 2 ppd since 74 yo.  quit in 2009  Vaping Use   Vaping Use: Never used  Substance Use Topics   Alcohol use: No    Alcohol/week: 0.0 standard drinks   Drug use: No   Family Status  Relation Name Status   Mother  Deceased   Father  Deceased   Sister  Alive   Brother  Alive   Sister  Alive   Mat Aunt  (Not Specified)   Sister  Deceased   Allergies  Allergen Reactions   Aspirin     AVOIDs due to history of Von Willebrands   Gabapentin     dizzy   Lyrica [Pregabalin] Other (See Comments)    Dizziness   Tetracycline Rash   Medications: Outpatient Medications Prior to Visit  Medication Sig   acetaminophen (TYLENOL) 650 MG CR tablet Take 500 mg by mouth every 8 (eight) hours as needed for pain.    albuterol (ACCUNEB) 0.63 MG/3ML nebulizer solution Inhale into the lungs. Take  3 mLs (0.63 mg total) by nebulization every 6 (six) hours as needed for Wheezing   albuterol (VENTOLIN HFA) 108 (90 Base) MCG/ACT inhaler Inhale 2 puffs into the lungs every 6 (six) hours as needed for wheezing or shortness of breath.   aspirin 81 MG tablet Take 1 tablet by mouth daily.   Baclofen 5 MG TABS Take 1 tablet by mouth three times daily as needed   Blood Glucose Monitoring Suppl (ONE TOUCH ULTRA 2) w/Device KIT Use daily to check blood sugar for type 2 diabetes E11.9   calcium carbonate (CALCIUM 600) 600 MG TABS tablet Take 500 mg by mouth 2 (two) times daily with a meal.    calcium carbonate (TUMS EX) 750 MG chewable tablet Chew 2 tablets by mouth 2 (two) times daily as needed for heartburn.   cholecalciferol (VITAMIN D) 400 units TABS tablet Take 400 Units by mouth daily.   diclofenac Sodium (VOLTAREN) 1 % GEL Voltaren 1 % topical gel  Apply to ankle twice daily.   donepezil (ARICEPT) 5 MG tablet Take 1 tablet (5 mg total) by mouth at bedtime.   fluticasone (FLONASE) 50 MCG/ACT nasal spray Place 2 sprays into both nostrils daily.   furosemide (LASIX) 20 MG tablet Take 40 mg by mouth daily at 2 PM.    levothyroxine (SYNTHROID) 125 MCG tablet TAKE 1 TABLET BY MOUTH ONCE DAILY BEFORE BREAKFAST   losartan (COZAAR) 50 MG tablet Take 1 tablet by mouth once daily   metFORMIN (GLUCOPHAGE-XR) 500 MG 24 hr tablet TAKE 2 TABLETS BY MOUTH IN THE MORNING AND 2 TABLETS IN THE EVENING   montelukast (SINGULAIR) 10 MG tablet Take 10 mg by mouth at bedtime.   MULTIPLE VITAMIN PO Take 1 tablet by mouth daily.   Omega-3 Fatty Acids (FISH OIL BURP-LESS) 1000 MG CAPS Take 1 tablet by mouth 2 (two) times daily.    ONETOUCH ULTRA test strip USE TEST STRIP(S) TO CHECK BLOOD SUGAR DAILY FOR TYPE 2 DIABETES   OXYGEN Inhale 3 L into the lungs. Used at night and PRN during the day   potassium chloride SA (KLOR-CON) 20 MEQ tablet Take 1 tablet (20 mEq total) by mouth daily.   RABEprazole (ACIPHEX) 20 MG tablet  Take 1 tablet (20 mg total) by mouth daily.   rosuvastatin (CRESTOR) 20 MG tablet Take 1 tablet by mouth once daily   SPIRIVA RESPIMAT 2.5 MCG/ACT AERS SMARTSIG:2 Spray(s) By Mouth Daily   TRELEGY ELLIPTA 100-62.5-25 MCG/INH AEPB Inhale 1 puff by mouth once daily  trolamine salicylate (ASPERCREME) 10 % cream Apply 1 application topically as needed for muscle pain.   vitamin B-12 (CYANOCOBALAMIN) 50 MCG tablet Take 50 mcg by mouth daily.   No facility-administered medications prior to visit.    Review of Systems  Constitutional: Negative.   HENT: Negative.    Respiratory: Negative.    Cardiovascular:  Positive for leg swelling.       Lower legs with some edema.  Gastrointestinal: Negative.   Musculoskeletal: Negative.      Objective    BP (!) 104/57 (BP Location: Right Arm, Patient Position: Sitting, Cuff Size: Normal)    Pulse 69    Temp (!) 96.9 F (36.1 C) (Temporal)    Ht _0  (1.676 m)    Wt 182 lb 9.6 oz (82.8 kg)    BMI 29.47 kg/m    Physical Exam Constitutional:      General: She is not in acute distress.    Appearance: She is well-developed.  HENT:     Head: Normocephalic and atraumatic.     Right Ear: Hearing and tympanic membrane normal.     Left Ear: Hearing and tympanic membrane normal.     Nose: Nose normal.  Eyes:     General: Lids are normal. No scleral icterus.       Right eye: No discharge.        Left eye: No discharge.     Conjunctiva/sclera: Conjunctivae normal.  Cardiovascular:     Rate and Rhythm: Normal rate and regular rhythm.     Pulses: Normal pulses.     Heart sounds: Normal heart sounds.  Pulmonary:     Effort: Pulmonary effort is normal. No respiratory distress.     Breath sounds: Normal breath sounds.  Abdominal:     General: Bowel sounds are normal.     Palpations: Abdomen is soft.  Musculoskeletal:        General: Swelling present. Normal range of motion.     Cervical back: Neck supple.     Comments: Lower legs shows some  swelling and varicose veins. Tan coloration to anterior lower legs without open sores.  Skin:    Findings: No lesion or rash.  Neurological:     Mental Status: She is alert and oriented to person, place, and time.  Psychiatric:        Speech: Speech normal.        Behavior: Behavior normal.        Thought Content: Thought content normal.      No results found for any visits on 07/16/19.  Assessment & Plan     1. Bilateral lower extremity edema Secondary to venous insufficiency and should use support hose. Check labs for signs of infection. Good pulses.  - CBC with Differential/Platelet - Comprehensive metabolic panel  2. Venous stasis dermatitis of both lower extremities Agree with stasis dermatitis and need for support hose use.  - CBC with Differential/Platelet - Comprehensive metabolic panel  3. Type 2 diabetes mellitus with neurological complications (HCC) Hgb W2H was 6.3% on 11-23-18 and continues to take Metformin 500 mg 2 tablets each morning and 2 in the evening. With memory issues, unsure diet is followed. Recheck labs. - CBC with Differential/Platelet - Comprehensive metabolic panel - Hemoglobin A1c  4. Memory loss No significant improvement on the Aricept 5 mg qd. Check follow up labs. May need increase in Aricept dosage. - CBC with Differential/Platelet - Comprehensive metabolic panel - Hemoglobin A1c - TSH  5. Hypercholesteremia  Tolerating Crestor 20 mg qd without side effects. Continue low fat diet and recheck labs. - Comprehensive metabolic panel - TSH - Lipid panel  6. Melena No abdominal pain or bright red blood in stools. Has been taking Iron supplement. Will check CBC for sign of blood loss. May need recheck with GI. - CBC with Differential/Platelet  7. Non-small cell lung cancer, unspecified laterality ([place]) History of stage I non-small cell lung cancer completed with SBRT on 09/10/2018 and clinically stable at the [place] follow up at the  [facility] on 03-11-19. Planning follow up chest CT on 07-22-19 and appointment with Dr. Donella Stade (oncologist) on 08-01-19. - CBC with Differential/Platelet - Comprehensive metabolic panel   No follow-ups on file.      I, Indy Kuck, PA-C, have reviewed all documentation for this visit. The documentation on 09/24/20 for the exam, diagnosis, procedures, and orders are all accurate and complete.    Vernie Murders, [place] [phone] (phone) [phone] (fax)  [place]

## 2019-07-17 LAB — COMPREHENSIVE METABOLIC PANEL
ALT: 17 IU/L (ref 0–32)
AST: 26 IU/L (ref 0–40)
Albumin/Globulin Ratio: 2.1 (ref 1.2–2.2)
Albumin: 4.5 g/dL (ref 3.7–4.7)
Alkaline Phosphatase: 50 IU/L (ref 48–121)
BUN/Creatinine Ratio: 15 (ref 12–28)
BUN: 10 mg/dL (ref 8–27)
Bilirubin Total: 0.2 mg/dL (ref 0.0–1.2)
CO2: 23 mmol/L (ref 20–29)
Calcium: 9.2 mg/dL (ref 8.7–10.3)
Chloride: 99 mmol/L (ref 96–106)
Creatinine, Ser: 0.67 mg/dL (ref 0.57–1.00)
GFR calc Af Amer: 102 mL/min/{1.73_m2} (ref >59)
GFR calc non Af Amer: 88 mL/min/{1.73_m2} (ref >59)
Globulin, Total: 2.1 g/dL (ref 1.5–4.5)
Glucose: 81 mg/dL (ref 65–99)
Potassium: 4.1 mmol/L (ref 3.5–5.2)
Sodium: 136 mmol/L (ref 134–144)
Total Protein: 6.6 g/dL (ref 6.0–8.5)

## 2019-07-17 LAB — CBC WITH DIFFERENTIAL/PLATELET
Basophils Absolute: 0 10*3/uL (ref 0.0–0.2)
Basos: 1 %
EOS (ABSOLUTE): 0.1 10*3/uL (ref 0.0–0.4)
Eos: 1 %
Hematocrit: 34.6 % (ref 34.0–46.6)
Hemoglobin: 11.3 g/dL (ref 11.1–15.9)
Immature Grans (Abs): 0 10*3/uL (ref 0.0–0.1)
Immature Granulocytes: 0 %
Lymphocytes Absolute: 2.4 10*3/uL (ref 0.7–3.1)
Lymphs: 41 %
MCH: 29.6 pg (ref 26.6–33.0)
MCHC: 32.7 g/dL (ref 31.5–35.7)
MCV: 91 fL (ref 79–97)
Monocytes Absolute: 0.8 10*3/uL (ref 0.1–0.9)
Monocytes: 13 %
Neutrophils Absolute: 2.7 10*3/uL (ref 1.4–7.0)
Neutrophils: 44 %
Platelets: 157 10*3/uL (ref 150–450)
RBC: 3.82 x10E6/uL (ref 3.77–5.28)
RDW: 13.7 % (ref 11.7–15.4)
WBC: 6 10*3/uL (ref 3.4–10.8)

## 2019-07-17 LAB — HEMOGLOBIN A1C
Est. average glucose Bld gHb Est-mCnc: 117 mg/dL
Hgb A1c MFr Bld: 5.7 % — ABNORMAL HIGH (ref 4.8–5.6)

## 2019-07-17 LAB — TSH: TSH: 2.74 u[IU]/mL (ref 0.450–4.500)

## 2019-07-17 LAB — LIPID PANEL
Chol/HDL Ratio: 2.5 ratio (ref 0.0–4.4)
Cholesterol, Total: 172 mg/dL (ref 100–199)
HDL: 69 mg/dL (ref >39)
LDL Chol Calc (NIH): 89 mg/dL (ref 0–99)
Triglycerides: 75 mg/dL (ref 0–149)
VLDL Cholesterol Cal: 14 mg/dL (ref 5–40)

## 2019-07-22 ENCOUNTER — Other Ambulatory Visit (INDEPENDENT_AMBULATORY_CARE_PROVIDER_SITE_OTHER): Payer: Medicare Other

## 2019-07-22 ENCOUNTER — Other Ambulatory Visit: Payer: Self-pay

## 2019-07-22 ENCOUNTER — Ambulatory Visit
Admission: RE | Admit: 2019-07-22 | Discharge: 2019-07-22 | Disposition: A | Discharge status: Home / Self Care | Payer: Medicare Other | Source: Ambulatory Visit | Attending: Radiation Oncology | Admitting: Radiation Oncology

## 2019-07-22 DIAGNOSIS — R911 Solitary pulmonary nodule: Secondary | ICD-10-CM | POA: Diagnosis not present

## 2019-07-22 DIAGNOSIS — I700 Atherosclerosis of aorta: Secondary | ICD-10-CM | POA: Diagnosis not present

## 2019-07-22 DIAGNOSIS — Z1211 Encounter for screening for malignant neoplasm of colon: Secondary | ICD-10-CM | POA: Diagnosis not present

## 2019-07-22 DIAGNOSIS — I2510 Atherosclerotic heart disease of native coronary artery without angina pectoris: Secondary | ICD-10-CM | POA: Diagnosis not present

## 2019-07-22 DIAGNOSIS — C3490 Malignant neoplasm of unspecified part of unspecified bronchus or lung: Secondary | ICD-10-CM | POA: Diagnosis not present

## 2019-07-22 DIAGNOSIS — J432 Centrilobular emphysema: Secondary | ICD-10-CM | POA: Diagnosis not present

## 2019-07-22 LAB — IFOBT (OCCULT BLOOD): IFOBT: NEGATIVE

## 2019-07-22 MED ORDER — IOHEXOL 300 MG/ML  SOLN
75.0000 mL | Freq: Once | INTRAMUSCULAR | Status: AC | PRN
  Administered 2019-07-22: 75 mL via INTRAVENOUS

## 2019-07-22 NOTE — Progress Notes (Signed)
Patient dropped off oclite that was collected on 07/20/19.

## 2019-07-23 ENCOUNTER — Encounter: Payer: Self-pay | Admitting: Podiatry

## 2019-07-23 ENCOUNTER — Ambulatory Visit (INDEPENDENT_AMBULATORY_CARE_PROVIDER_SITE_OTHER): Payer: Medicare Other | Admitting: Podiatry

## 2019-07-23 ENCOUNTER — Other Ambulatory Visit: Payer: Self-pay

## 2019-07-23 ENCOUNTER — Telehealth: Payer: Self-pay

## 2019-07-23 DIAGNOSIS — E0843 Diabetes mellitus due to underlying condition with diabetic autonomic (poly)neuropathy: Secondary | ICD-10-CM | POA: Diagnosis not present

## 2019-07-23 DIAGNOSIS — Q828 Other specified congenital malformations of skin: Secondary | ICD-10-CM | POA: Diagnosis not present

## 2019-07-23 DIAGNOSIS — L989 Disorder of the skin and subcutaneous tissue, unspecified: Secondary | ICD-10-CM

## 2019-07-23 DIAGNOSIS — L852 Keratosis punctata (palmaris et plantaris): Secondary | ICD-10-CM

## 2019-07-23 NOTE — Telephone Encounter (Signed)
Patient advised.

## 2019-07-23 NOTE — Telephone Encounter (Signed)
-----   Message from Margo Common, [place] sent at 07/22/2019  7:02 PM EDT ----- Stool test negative for blood. Must be from Iron, Pepto-Bismol or something dark she had eaten.

## 2019-07-24 ENCOUNTER — Other Ambulatory Visit: Payer: Self-pay | Admitting: Family Medicine

## 2019-07-24 NOTE — Progress Notes (Signed)
   HPI: 74 y.o. female presenting today for evaluation of multiple calluses and corns that have developed to the bilateral plantar feet.  They are very small but they are very symptomatic.  She would like to have further evaluation.  Patient does have a history of diabetes mellitus unknown whether she is controlled or not.  She does not follow-up regularly with her PCP for the diabetes.  Past Medical History:  Diagnosis Date  . Arthritis    osteo  . Cancer ([place]) 1969   CERVICAL CA  . COPD (chronic obstructive pulmonary disease) ([place])   . Diabetes mellitus without complication ([place])   . Diabetic peripheral neuropathy ([place])   . Environmental and seasonal allergies   . GERD (gastroesophageal reflux disease)   . History of cervical cancer   . History of colon polyps   . Hypercholesteremia   . Hypertension   . Hypothyroidism   . Nail dystrophy   . Neuropathy   . Proteinuria   . Restless legs syndrome   . Sleep apnea   . TIA (transient ischemic attack)   . Von Willebrand's disease [facility])      Physical Exam: General: The patient is alert and oriented x3 in no acute distress.  Dermatology: Skin is warm, dry and supple bilateral lower extremities. Negative for open lesions or macerations.  Multiple hyperkeratotic callus lesions noted with central nucleated cores throughout the plantar aspects of the bilateral feet consistent with porokeratosis.  Vascular: Palpable pedal pulses bilaterally. No edema or erythema noted. Capillary refill within normal limits.  Neurological: Epicritic and protective threshold diminished bilaterally.   Musculoskeletal Exam: Range of motion within normal limits to all pedal and ankle joints bilateral. Muscle strength 5/5 in all groups bilateral.   Assessment: 1.  Multiple porokeratosis bilateral feet 2.  Diabetes mellitus, with manifestations of peripheral polyneuropathy   Plan of Care:  1. Patient evaluated.  2.  Light debridement was performed of  several of the porokeratotic lesions using a chisel blade and tissue nipper without incident or bleeding 3.  Recommend OTC AmLactin lotion daily 4.  Stressed the importance of managing her diabetes closer.  Recommend follow-up with PCP 5.  Recommend good supportive shoes and not going barefoot 6.  Return to clinic annually      Edrick Kins, DPM [facility]  Dr. Edrick Kins, DPM    [address], [place] [address]                Office [phone]  Fax [phone]

## 2019-07-29 ENCOUNTER — Ambulatory Visit: Payer: Self-pay | Admitting: *Deleted

## 2019-07-29 ENCOUNTER — Telehealth: Payer: Self-pay

## 2019-07-29 ENCOUNTER — Other Ambulatory Visit: Payer: Self-pay

## 2019-07-29 ENCOUNTER — Emergency Department
Admission: EM | Admit: 2019-07-29 | Discharge: 2019-07-29 | Disposition: A | Discharge status: Home / Self Care | Payer: Medicare Other | Attending: Emergency Medicine | Admitting: Emergency Medicine

## 2019-07-29 ENCOUNTER — Emergency Department: Payer: Medicare Other

## 2019-07-29 DIAGNOSIS — Z96652 Presence of left artificial knee joint: Secondary | ICD-10-CM | POA: Insufficient documentation

## 2019-07-29 DIAGNOSIS — Z87891 Personal history of nicotine dependence: Secondary | ICD-10-CM | POA: Diagnosis not present

## 2019-07-29 DIAGNOSIS — L03116 Cellulitis of left lower limb: Secondary | ICD-10-CM | POA: Diagnosis not present

## 2019-07-29 DIAGNOSIS — Z8541 Personal history of malignant neoplasm of cervix uteri: Secondary | ICD-10-CM | POA: Diagnosis not present

## 2019-07-29 DIAGNOSIS — Z7982 Long term (current) use of aspirin: Secondary | ICD-10-CM | POA: Insufficient documentation

## 2019-07-29 DIAGNOSIS — L03119 Cellulitis of unspecified part of limb: Secondary | ICD-10-CM | POA: Diagnosis not present

## 2019-07-29 DIAGNOSIS — J449 Chronic obstructive pulmonary disease, unspecified: Secondary | ICD-10-CM | POA: Diagnosis not present

## 2019-07-29 DIAGNOSIS — Z7984 Long term (current) use of oral hypoglycemic drugs: Secondary | ICD-10-CM | POA: Insufficient documentation

## 2019-07-29 DIAGNOSIS — R600 Localized edema: Secondary | ICD-10-CM | POA: Insufficient documentation

## 2019-07-29 DIAGNOSIS — E039 Hypothyroidism, unspecified: Secondary | ICD-10-CM | POA: Insufficient documentation

## 2019-07-29 DIAGNOSIS — M7989 Other specified soft tissue disorders: Secondary | ICD-10-CM | POA: Diagnosis not present

## 2019-07-29 DIAGNOSIS — L03115 Cellulitis of right lower limb: Secondary | ICD-10-CM | POA: Diagnosis not present

## 2019-07-29 DIAGNOSIS — E119 Type 2 diabetes mellitus without complications: Secondary | ICD-10-CM | POA: Diagnosis not present

## 2019-07-29 DIAGNOSIS — Z79899 Other long term (current) drug therapy: Secondary | ICD-10-CM | POA: Insufficient documentation

## 2019-07-29 DIAGNOSIS — I119 Hypertensive heart disease without heart failure: Secondary | ICD-10-CM | POA: Insufficient documentation

## 2019-07-29 DIAGNOSIS — R609 Edema, unspecified: Secondary | ICD-10-CM

## 2019-07-29 LAB — BASIC METABOLIC PANEL
Anion gap: 4 — ABNORMAL LOW (ref 5–15)
BUN: 10 mg/dL (ref 8–23)
CO2: 28 mmol/L (ref 22–32)
Calcium: 9.2 mg/dL (ref 8.9–10.3)
Chloride: 104 mmol/L (ref 98–111)
Creatinine, Ser: 0.69 mg/dL (ref 0.44–1.00)
GFR calc Af Amer: >60 mL/min (ref >60)
GFR calc non Af Amer: >60 mL/min (ref >60)
Glucose, Bld: 137 mg/dL — ABNORMAL HIGH (ref 70–99)
Potassium: 3.9 mmol/L (ref 3.5–5.1)
Sodium: 136 mmol/L (ref 135–145)

## 2019-07-29 LAB — HEPATIC FUNCTION PANEL
ALT: 17 U/L (ref 0–44)
AST: 25 U/L (ref 15–41)
Albumin: 4 g/dL (ref 3.5–5.0)
Alkaline Phosphatase: 47 U/L (ref 38–126)
Bilirubin, Direct: <0.1 mg/dL (ref 0.0–0.2)
Total Bilirubin: 0.5 mg/dL (ref 0.3–1.2)
Total Protein: 7.1 g/dL (ref 6.5–8.1)

## 2019-07-29 LAB — CBC
HCT: 36.7 % (ref 36.0–46.0)
Hemoglobin: 12.3 g/dL (ref 12.0–15.0)
MCH: 29.9 pg (ref 26.0–34.0)
MCHC: 33.5 g/dL (ref 30.0–36.0)
MCV: 89.3 fL (ref 80.0–100.0)
Platelets: 173 10*3/uL (ref 150–400)
RBC: 4.11 MIL/uL (ref 3.87–5.11)
RDW: 13.8 % (ref 11.5–15.5)
WBC: 5 10*3/uL (ref 4.0–10.5)
nRBC: 0 % (ref 0.0–0.2)

## 2019-07-29 LAB — BRAIN NATRIURETIC PEPTIDE: B Natriuretic Peptide: 29.9 pg/mL (ref 0.0–100.0)

## 2019-07-29 MED ORDER — CLINDAMYCIN PHOSPHATE 600 MG/50ML IV SOLN
600.0000 mg | Freq: Once | INTRAVENOUS | Status: AC
  Administered 2019-07-29: 600 mg via INTRAVENOUS
  Filled 2019-07-29: qty 50

## 2019-07-29 MED ORDER — CLINDAMYCIN HCL 300 MG PO CAPS
300.0000 mg | ORAL_CAPSULE | Freq: Three times a day (TID) | ORAL | 0 refills | Status: AC
Start: 2019-07-29 — End: 2019-08-05

## 2019-07-29 MED ORDER — TRAMADOL HCL 50 MG PO TABS: 50.0000 mg | ORAL_TABLET | Freq: Four times a day (QID) | ORAL | 0 refills | Status: AC | PRN

## 2019-07-29 NOTE — Telephone Encounter (Signed)
FYI

## 2019-07-29 NOTE — ED Notes (Signed)
Pt sitting on side of bed talking on the phone in NAD. Pt reports her lower legs have been red for "months". Pt reports her ankles and feet will also swell. Pt reports sh uses O2 intermittently and is asking this RN for O2. Pt reports she took last dose of 2 week course of amoxicillin yesterday.

## 2019-07-29 NOTE — Discharge Instructions (Addendum)
Take the clindamycin as prescribed and finish the full 1 week course.  You may take the tramadol as needed for more severe pain.  Follow-up with your primary care doctor within the next 1 to 2 weeks.  Return to the ER immediately for new, worsening, or persistent severe rash, swelling, spreading of the redness of the leg, weakness or numbness, fever or chills, or any other new or worsening symptoms or concerning.

## 2019-07-29 NOTE — ED Provider Notes (Signed)
[facility] Emergency Department Provider Note ____________________________________________   First MD Initiated Contact with Patient 07/29/19 1504     (approximate)  I have reviewed the triage vital signs and the nursing notes.   HISTORY  Chief Complaint Leg Swelling    HPI Samantha Henry is a 74 y.o. female with PMH as noted below who presents with bilateral lower extremity swelling and redness, present for months, but which she states is somewhat worse recently.  The patient states that in particular the right leg appears more red, and the legs have become somewhat more painful.  She states that she was started on amoxicillin about 10 days ago and completed the full course with no improvement.  She had an ultrasound to rule out DVT last year.  She denies chest pain or shortness of breath.  She has no fever or chills.  She denies any trauma to the legs, has no numbness or weakness.  Past Medical History:  Diagnosis Date  . Arthritis    osteo  . Cancer ([place]) 1969   CERVICAL CA  . COPD (chronic obstructive pulmonary disease) ([place])   . Diabetes mellitus without complication ([place])   . Diabetic peripheral neuropathy ([place])   . Environmental and seasonal allergies   . GERD (gastroesophageal reflux disease)   . History of cervical cancer   . History of colon polyps   . Hypercholesteremia   . Hypertension   . Hypothyroidism   . Nail dystrophy   . Neuropathy   . Proteinuria   . Restless legs syndrome   . Sleep apnea   . TIA (transient ischemic attack)   . Von Willebrand's disease [facility])     Patient Active Problem List   Diagnosis Date Noted  . Plantar wart 05/08/2019  . Venous stasis 04/29/2019  . Chronic venous insufficiency 04/29/2019  . Non-small cell lung cancer (NSCLC) ([place]) 03/01/2019  . Memory loss 01/01/2019  . Cerebrovascular disease 12/25/2018  . Carotid atherosclerosis, bilateral 12/25/2018  . Right internal carotid artery aneurysm  12/11/2018  . Systolic murmur 95/62/1308  . Senile purpura ([place]) 11/23/2018  . Iron deficiency anemia 11/23/2018  . Intention tremor 11/23/2018  . Lung nodule seen on imaging study 11/23/2018  . Aortic atherosclerosis ([place]) 07/12/2018  . Coronary artery disease 07/12/2018  . Aortic valve calcification 07/12/2018  . DDD (degenerative disc disease), cervical 02/01/2018  . Chronic bilateral back pain 02/01/2018  . Morbid obesity ([place]) 06/26/2017  . History of smoking 30 or more pack years 05/12/2017  . Status post total knee replacement using cement, left 06/14/2016  . Polyneuropathy 02/26/2015  . Allergic rhinitis 07/29/2014  . COPD (chronic obstructive pulmonary disease) ([place]) 07/25/2014  . Type 2 diabetes mellitus with neurological complications ([place]) 65/78/4696  . Diverticulosis of colon 07/25/2014  . Edema 07/25/2014  . GERD (gastroesophageal reflux disease) 07/25/2014  . Hypercholesteremia 07/25/2014  . Hypertension 07/25/2014  . Hypothyroid 07/25/2014  . Cramps of lower extremity 07/25/2014  . Microalbuminuria 07/25/2014  . Nail dystrophy 07/25/2014  . Osteoarthritis of leg 07/25/2014  . Restless legs syndrome 07/25/2014  . TIA (transient ischemic attack) 07/25/2014  . Von Willebrand's disease ([place]) 07/25/2014  . OSA (obstructive sleep apnea) 10/31/2013  . History of adenomatous polyp of colon 08/26/2005    Past Surgical History:  Procedure Laterality Date  . ABDOMINAL HYSTERECTOMY  1969   due to cervical cancer   . APPENDECTOMY    . BLADDER SURGERY  1997   bladder tacking  . BREAST  BIOPSY Left 01/21/2015   FIBROADENOMATOUS CHANGE WITH ASSOCIATED MICROCALCIFICATIONS  . CARDIAC CATHETERIZATION    . CARPAL TUNNEL RELEASE  2010  . colonoscopy and upper endoscopy'    . COLONOSCOPY WITH PROPOFOL N/A 02/25/2016   Procedure: COLONOSCOPY WITH PROPOFOL;  Surgeon: Lollie Sails, MD;  [facility] ENDOSCOPY;  Service: Endoscopy;  Laterality: N/A;  . COLONOSCOPY WITH  PROPOFOL N/A 03/22/2019   Procedure: COLONOSCOPY WITH PROPOFOL;  Surgeon: Robert Bellow, MD;  Location: [facility] ENDOSCOPY;  Service: Endoscopy;  Laterality: N/A;  . ESOPHAGOGASTRODUODENOSCOPY (EGD) WITH PROPOFOL N/A 03/22/2019   Procedure: ESOPHAGOGASTRODUODENOSCOPY (EGD) WITH PROPOFOL;  Surgeon: Robert Bellow, MD;  Location: [facility] ENDOSCOPY;  Service: Endoscopy;  Laterality: N/A;  . JOINT REPLACEMENT Left 06/14/2016   KNEE  . MUSCLE BIOPSY    . OOPHORECTOMY    . TENDON RELEASE  2010   right foot, Dr. Vickki Muff   . TOTAL KNEE ARTHROPLASTY Left 06/14/2016   Procedure: TOTAL KNEE ARTHROPLASTY;  Surgeon: Corky Mull, MD;  Location: [facility] ORS;  Service: Orthopedics;  Laterality: Left;    Prior to Admission medications   Medication Sig Start Date End Date Taking? Authorizing Provider  acetaminophen (TYLENOL) 650 MG CR tablet Take 500 mg by mouth every 8 (eight) hours as needed for pain.     [provider]  albuterol (ACCUNEB) 0.63 MG/3ML nebulizer solution Inhale into the lungs. Take 3 mLs (0.63 mg total) by nebulization every 6 (six) hours as needed for Wheezing 07/30/18 07/30/19  [provider]  albuterol (VENTOLIN HFA) 108 (90 Base) MCG/ACT inhaler Inhale 2 puffs into the lungs every 6 (six) hours as needed for wheezing or shortness of breath. 05/08/19   Virginia Crews, MD  amoxicillin (AMOXIL) 875 MG tablet Take 1 tablet (875 mg total) by mouth 2 (two) times daily. 07/16/19   Chrismon, Vickki Muff, PA  aspirin 81 MG tablet Take 1 tablet by mouth daily. Patient not taking: Reported on 07/16/2019    [provider]  Baclofen 5 MG TABS Take 1 tablet by mouth three times daily as needed 07/01/19   Virginia Crews, MD  Blood Glucose Monitoring Suppl (ONE TOUCH ULTRA 2) w/Device KIT Use daily to check blood sugar for type 2 diabetes E11.9 Patient not taking: Reported on 07/16/2019 01/11/18   Birdie Sons, MD  calcium carbonate (CALCIUM 600) 600 MG TABS tablet Take 500  mg by mouth 2 (two) times daily with a meal.     [provider]  calcium carbonate (TUMS EX) 750 MG chewable tablet Chew 2 tablets by mouth 2 (two) times daily as needed for heartburn.    [provider]  cholecalciferol (VITAMIN D) 400 units TABS tablet Take 400 Units by mouth daily.    [provider]  clindamycin (CLEOCIN) 300 MG capsule Take 1 capsule (300 mg total) by mouth 3 (three) times daily for 7 days. 07/29/19 08/05/19  Arta Silence, MD  diclofenac Sodium (VOLTAREN) 1 % GEL Voltaren 1 % topical gel  Apply to ankle twice daily. Patient not taking: Reported on 07/16/2019    [provider]  donepezil (ARICEPT) 5 MG tablet Take 1 tablet (5 mg total) by mouth at bedtime. 12/25/18   Birdie Sons, MD  fluticasone (FLONASE) 50 MCG/ACT nasal spray Place 2 sprays into both nostrils daily. 05/08/19   Virginia Crews, MD  furosemide (LASIX) 20 MG tablet Take 40 mg by mouth daily at 2 PM.  10/18/18   [provider]  levothyroxine (SYNTHROID) 125 MCG tablet TAKE 1 TABLET BY MOUTH ONCE DAILY BEFORE BREAKFAST 01/07/19   Birdie Sons, MD  losartan (COZAAR) 50 MG tablet Take 1 tablet by mouth once daily 06/27/18   Birdie Sons, MD  metFORMIN (GLUCOPHAGE-XR) 500 MG 24 hr tablet TAKE 2 TABLETS BY MOUTH IN THE MORNING AND 2 TABLETS IN THE EVENING 05/13/19   Birdie Sons, MD  montelukast (SINGULAIR) 10 MG tablet Take 10 mg by mouth at bedtime. 11/19/18   [provider]  MULTIPLE VITAMIN PO Take 1 tablet by mouth daily.    [provider]  Omega-3 Fatty Acids (FISH OIL BURP-LESS) 1000 MG CAPS Take 1 tablet by mouth 2 (two) times daily.     [provider]  [facility] ULTRA test strip USE TEST STRIP(S) TO CHECK BLOOD SUGAR DAILY FOR TYPE 2 DIABETES Patient not taking: Reported on 07/16/2019 02/12/19   Birdie Sons, MD  OXYGEN Inhale 3 L into the lungs. Used at night and PRN during the day    [provider]   potassium chloride SA (KLOR-CON) 20 MEQ tablet Take 1 tablet (20 mEq total) by mouth daily. 04/02/19   Birdie Sons, MD  RABEprazole (ACIPHEX) 20 MG tablet Take 1 tablet (20 mg total) by mouth daily. 04/22/19   Birdie Sons, MD  rosuvastatin (CRESTOR) 20 MG tablet Take 1 tablet by mouth once daily 06/13/19   Birdie Sons, MD  SPIRIVA RESPIMAT 2.5 MCG/ACT AERS SMARTSIG:2 Spray(s) By Mouth Daily Patient not taking: Reported on 07/16/2019 04/23/19   [provider]  traMADol (ULTRAM) 50 MG tablet Take 1 tablet (50 mg total) by mouth every 6 (six) hours as needed for up to 5 days for moderate pain. 07/29/19 08/03/19  Arta Silence, MD  TRELEGY ELLIPTA 100-62.5-25 MCG/INH AEPB Inhale 1 puff by mouth once daily 07/08/19   Virginia Crews, MD  trolamine salicylate (ASPERCREME) 10 % cream Apply 1 application topically as needed for muscle pain. Patient not taking: Reported on 07/16/2019    [provider]  vitamin B-12 (CYANOCOBALAMIN) 50 MCG tablet Take 50 mcg by mouth daily.    [provider]    Allergies Aspirin, Gabapentin, Lyrica [pregabalin], and Tetracycline  Family History  Problem Relation Age of Onset  . Alzheimer's disease Mother   . Cancer Father   . Diabetes Sister   . Colon cancer Sister   . Diabetes Brother   . Cancer Sister 29       melanoma  . Breast cancer Maternal Aunt   . Hodgkin's lymphoma Sister     Social History Social History   Tobacco Use  . Smoking status: Former Smoker    Packs/day: 1.50    Years: 48.00    Pack years: 72.00    Types: Cigarettes    Quit date: 01/11/2007    Years since quitting: 12.5  . Smokeless tobacco: Never Used  . Tobacco comment: smoked up to 2 ppd since 74 yo. quit in 2009  Vaping Use  . Vaping Use: Never used  Substance Use Topics  . Alcohol use: No    Alcohol/week: 0.0 standard drinks  . Drug use: No    Review of Systems  Constitutional: No fever/chills. Eyes: No redness. ENT: No  sore throat. Cardiovascular: Denies chest pain. Respiratory: Denies shortness of breath. Gastrointestinal: No vomiting or diarrhea.  Genitourinary: Negative for dysuria.  Musculoskeletal: Positive for leg swelling. Skin: Positive for redness to the legs. Neurological: Negative for  focal weakness or numbness.   ____________________________________________   PHYSICAL EXAM:  VITAL SIGNS: ED Triage Vitals [07/29/19 1127]  Enc Vitals Group     BP (!) 126/43     Pulse Rate 78     Resp 18     Temp 98 F (36.7 C)     Temp src      SpO2 96 %     Weight 182 lb 11 oz (82.9 kg)     Height _0  (1.676 m)     Head Circumference      Peak Flow      Pain Score 10     Pain Loc      Pain Edu?      Excl. in [place]?     Constitutional: Alert and oriented. Well appearing and in no acute distress. Eyes: Conjunctivae are normal.  Head: Atraumatic. Nose: No congestion/rhinnorhea. Mouth/Throat: Mucous membranes are moist.   Neck: Normal range of motion.  Cardiovascular: Normal rate, regular rhythm.  Good peripheral circulation. Respiratory: Normal respiratory effort.  No retractions.  Gastrointestinal: No distention.  Musculoskeletal: 1+ bilateral lower extremity edema slightly worse on the right.  Extremities warm and well perfused.  Erythema with mild warmth and induration to the right anterior lower leg.  Very faint erythema to the anterior left lower leg.  2+ DP pulses bilaterally. Neurologic:  Normal speech and language.  Motor and sensory intact in all extremities. Skin:  Skin is warm and dry. No rash noted. Psychiatric: Mood and affect are normal. Speech and behavior are normal.  ____________________________________________   LABS (all labs ordered are listed, but only abnormal results are displayed)  Labs Reviewed  BASIC METABOLIC PANEL - Abnormal; Notable for the following components:      Result Value   Glucose, Bld 137 (*)    Anion gap 4 (*)    All other components within  normal limits  CBC  HEPATIC FUNCTION PANEL  BRAIN NATRIURETIC PEPTIDE   ____________________________________________  EKG   ____________________________________________  RADIOLOGY  US venous LLE bilateral: No acute DVT  ____________________________________________   PROCEDURES  Procedure(s) performed: No  Procedures  Critical Care performed: No ____________________________________________   INITIAL IMPRESSION / ASSESSMENT AND PLAN / ED COURSE  Pertinent labs & imaging results that were available during my care of the patient were reviewed by me and considered in my medical decision making (see chart for details).  73 year old female with PMH as noted above presents with persistent and somewhat worsening bilateral lower leg swelling and redness, somewhat worse on the right.  I reviewed the past medical records in [place].  The patient was seen by Dr. Amalia Hailey from podiatry 6 days ago, but on that visit she was primarily treated for calluses and corns on the feet.  Previously she was seen by Dr. Natale Milch on 7/6 and started on amoxicillin 870 mg twice daily.  Per a PCP note from 4/19, her chronic leg swelling was thought to be related to venous insufficiency with likely element of dermatitis.  There was no suspicion for DVT at that time.  She had a lower extremity ultrasound on 12/10/2018 which was negative for DVT.  On exam currently, the patient is well-appearing and her vital signs are normal.  The physical exam is as described above with mild bilateral lower extremity swelling and slightly worsened erythema and induration to the area of the right lower leg.  Overall I suspect most likely venous insufficiency with possible dermatitis, although I cannot rule out a  mild cellulitis especially on the right lower leg.  Lab work-up was obtained from triage and is reassuring with a normal WBC count and BNP.  There is no evidence of CHF.  I have a low suspicion for DVT given the negative  work-up, however given the persistent worsening nature of the symptoms I will obtain a DVT study today.  If this is negative we will treat with clindamycin for broader coverage and plan for outpatient follow-up.  There is no indication for inpatient admission at this time.  ----------------------------------------- 5:55 PM on 07/29/2019 -----------------------------------------  Ultrasound is negative for acute DVT.  The patient has received clindamycin IV and is stable for discharge home.  I counseled her and her family members on the results of the work-up and plan of care.  Return precautions given, and expressed understanding.  ____________________________________________   FINAL CLINICAL IMPRESSION(S) / ED DIAGNOSES  Final diagnoses:  Peripheral edema  Cellulitis of lower extremity, unspecified laterality      NEW MEDICATIONS STARTED DURING THIS VISIT:  New Prescriptions   CLINDAMYCIN (CLEOCIN) 300 MG CAPSULE    Take 1 capsule (300 mg total) by mouth 3 (three) times daily for 7 days.   TRAMADOL (ULTRAM) 50 MG TABLET    Take 1 tablet (50 mg total) by mouth every 6 (six) hours as needed for up to 5 days for moderate pain.     Note:  This document was prepared using Dragon voice recognition software and may include unintentional dictation errors.    Arta Silence, MD 07/29/19 1755

## 2019-07-29 NOTE — Telephone Encounter (Signed)
Patient is calling to report she has finished her antibiotics and her legs are not better. Patient's granddaughter is with her - she reports patient's feet and ankles are swollen, redness is above that and her legs look raw and she has severe pain to touch. Advised ED for evaluation- they agree to go.  Reason for Disposition . [1] Red area or streak AND [2] fever  Answer Assessment - Initial Assessment Questions 1. ONSET: "When did the swelling start?" (e.g., minutes, hours, days)     2-3 weeks ago 2. LOCATION: "What part of the leg is swollen?"  "Are both legs swollen or just one leg?"     Bilateral swelling  Is worse than left- swelling above ankle 3. SEVERITY: "How bad is the swelling?" (e.g., localized; mild, moderate, severe)  - Localized - small area of swelling localized to one leg  - MILD pedal edema - swelling limited to foot and ankle, pitting edema < 1/4 inch (6 mm) deep, rest and elevation eliminate most or all swelling  - MODERATE edema - swelling of lower leg to knee, pitting edema > 1/4 inch (6 mm) deep, rest and elevation only partially reduce swelling  - SEVERE edema - swelling extends above knee, facial or hand swelling present      Mild 4. REDNESS: "Does the swelling look red or infected?"     Yes- looks raw 5. PAIN: "Is the swelling painful to touch?" If Yes, ask: "How painful is it?"   (Scale 1-10; mild, moderate or severe)     severe 6. FEVER: "Do you have a fever?" If Yes, ask: "What is it, how was it measured, and when did it start?"      no 7. CAUSE: "What do you think is causing the leg swelling?"     cellulitis 8. MEDICAL HISTORY: "Do you have a history of heart failure, kidney disease, liver failure, or cancer?"     no 9. RECURRENT SYMPTOM: "Have you had leg swelling before?" If Yes, ask: "When was the last time?" "What happened that time?"     no 10. OTHER SYMPTOMS: "Do you have any other symptoms?" (e.g., chest pain, difficulty breathing)       no 11.  PREGNANCY: "Is there any chance you are pregnant?" "When was your last menstrual period?"       n/a  Protocols used: LEG SWELLING AND EDEMA-A-AH

## 2019-07-29 NOTE — Chronic Care Management (AMB) (Deleted)
Chronic Care Management Pharmacy  Name: Samantha Henry  MRN: [id number] DOB: [birth year]  Chief Complaint/ HPI  Samantha Henry,  74 y.o. , female presents for their Follow-Up CCM visit with the clinical pharmacist via telephone due to COVID-19 Pandemic.  PCP : Samantha Crews, MD  Their chronic conditions include: ***  Office Visits: 7/6 edema, [place]ism[place], BP 104/57 P 69 Wt 182.5 BMI 29.5  Consult Visit: 7/19 wound, ED 7/13 plantar, Evans DPM   Medications: Facility-Administered Encounter Medications as of 07/29/2019  Medication  . clindamycin (CLEOCIN) IVPB 600 mg   Outpatient Encounter Medications as of 07/29/2019  Medication Sig  . acetaminophen (TYLENOL) 650 MG CR tablet Take 500 mg by mouth every 8 (eight) hours as needed for pain.   Marland Kitchen albuterol (ACCUNEB) 0.63 MG/3ML nebulizer solution Inhale into the lungs. Take 3 mLs (0.63 mg total) by nebulization every 6 (six) hours as needed for Wheezing  . albuterol (VENTOLIN HFA) 108 (90 Base) MCG/ACT inhaler Inhale 2 puffs into the lungs every 6 (six) hours as needed for wheezing or shortness of breath.  Marland Kitchen amoxicillin (AMOXIL) 875 MG tablet Take 1 tablet (875 mg total) by mouth 2 (two) times daily.  Marland Kitchen aspirin 81 MG tablet Take 1 tablet by mouth daily. (Patient not taking: Reported on 07/16/2019)  . Baclofen 5 MG TABS Take 1 tablet by mouth three times daily as needed  . Blood Glucose Monitoring Suppl (ONE TOUCH ULTRA 2) w/Device KIT Use daily to check blood sugar for type 2 diabetes E11.9 (Patient not taking: Reported on 07/16/2019)  . calcium carbonate (CALCIUM 600) 600 MG TABS tablet Take 500 mg by mouth 2 (two) times daily with a meal.   . calcium carbonate (TUMS EX) 750 MG chewable tablet Chew 2 tablets by mouth 2 (two) times daily as needed for heartburn.  . cholecalciferol (VITAMIN D) 400 units TABS tablet Take 400 Units by mouth daily.  . diclofenac Sodium (VOLTAREN) 1 % GEL Voltaren 1 % topical gel  Apply to ankle twice  daily. (Patient not taking: Reported on 07/16/2019)  . donepezil (ARICEPT) 5 MG tablet Take 1 tablet (5 mg total) by mouth at bedtime.  . fluticasone (FLONASE) 50 MCG/ACT nasal spray Place 2 sprays into both nostrils daily.  . furosemide (LASIX) 20 MG tablet Take 40 mg by mouth daily at 2 PM.   . levothyroxine (SYNTHROID) 125 MCG tablet TAKE 1 TABLET BY MOUTH ONCE DAILY BEFORE BREAKFAST  . losartan (COZAAR) 50 MG tablet Take 1 tablet by mouth once daily  . metFORMIN (GLUCOPHAGE-XR) 500 MG 24 hr tablet TAKE 2 TABLETS BY MOUTH IN THE MORNING AND 2 TABLETS IN THE EVENING  . montelukast (SINGULAIR) 10 MG tablet Take 10 mg by mouth at bedtime.  . MULTIPLE VITAMIN PO Take 1 tablet by mouth daily.  . Omega-3 Fatty Acids (FISH OIL BURP-LESS) 1000 MG CAPS Take 1 tablet by mouth 2 (two) times daily.   [facility] ULTRA test strip USE TEST STRIP(S) TO CHECK BLOOD SUGAR DAILY FOR TYPE 2 DIABETES (Patient not taking: Reported on 07/16/2019)  . OXYGEN Inhale 3 L into the lungs. Used at night and PRN during the day  . potassium chloride SA (KLOR-CON) 20 MEQ tablet Take 1 tablet (20 mEq total) by mouth daily.  . RABEprazole (ACIPHEX) 20 MG tablet Take 1 tablet (20 mg total) by mouth daily.  . rosuvastatin (CRESTOR) 20 MG tablet Take 1 tablet by mouth once daily  . SPIRIVA RESPIMAT 2.5 MCG/ACT  AERS SMARTSIG:2 Spray(s) By Mouth Daily (Patient not taking: Reported on 07/16/2019)  . TRELEGY ELLIPTA 100-62.5-25 MCG/INH AEPB Inhale 1 puff by mouth once daily  . trolamine salicylate (ASPERCREME) 10 % cream Apply 1 application topically as needed for muscle pain. (Patient not taking: Reported on 07/16/2019)  . vitamin B-12 (CYANOCOBALAMIN) 50 MCG tablet Take 50 mcg by mouth daily.     Current Diagnosis/Assessment:  Goals Addressed   None     Medication Management   Pt uses *** pharmacy for all medications Uses pill box? {Yes or If no, why not?:20788} Pt endorses ***% compliance  We discussed:  Nasal spray  use? Aricept increase? Edema?   Plan  {US Pharmacy [id number]}    Follow up: *** month phone visit  ***

## 2019-07-29 NOTE — ED Triage Notes (Signed)
Pt comes via POV from home with c/o bilatera leg swelling. Pt was placed on amoxicillin and has since finished but family states no improvement.   Pt has bilateral leg swelling and some redness. Pt states severe pain.

## 2019-07-29 NOTE — ED Notes (Signed)
U/s at bedside

## 2019-08-01 ENCOUNTER — Other Ambulatory Visit: Payer: Self-pay

## 2019-08-01 ENCOUNTER — Encounter: Payer: Self-pay | Admitting: Radiation Oncology

## 2019-08-01 ENCOUNTER — Telehealth: Payer: Self-pay

## 2019-08-01 ENCOUNTER — Ambulatory Visit
Admission: RE | Admit: 2019-08-01 | Discharge: 2019-08-01 | Disposition: A | Discharge status: Home / Self Care | Payer: Medicare Other | Source: Ambulatory Visit | Attending: Radiation Oncology | Admitting: Radiation Oncology

## 2019-08-01 ENCOUNTER — Ambulatory Visit: Payer: Medicare Other | Admitting: Physician Assistant

## 2019-08-01 ENCOUNTER — Other Ambulatory Visit: Payer: Self-pay | Admitting: *Deleted

## 2019-08-01 VITALS — BP 132/55 | HR 66 | Temp 96.9°F | Resp 20 | Wt 178.9 lb

## 2019-08-01 DIAGNOSIS — C3412 Malignant neoplasm of upper lobe, left bronchus or lung: Secondary | ICD-10-CM

## 2019-08-01 DIAGNOSIS — R911 Solitary pulmonary nodule: Secondary | ICD-10-CM

## 2019-08-01 NOTE — Telephone Encounter (Signed)
Copied from [place] [id number]. Topic: General - Other >> Jul 12, 2019 11:01 AM Jodie Echevaria wrote: Reason for CRM: Patient daughter [name] called to request an appointment with Dr Caryn Section if that is ok with him and since Dr B is out. Per [name] patient is having pain in  her legs again. States that she would like something for this upcoming week. Not available on these dates 7/12/-13/and the 20 th. Please call Ph#   [phone]  >> Aug 01, 2019 11:51 AM Oneta Rack wrote: Patient was seen in the ED and would like to cancel today appointment, patient was advise to schedule a follow up appointment therefore patient is now scheduled with Marveen Reeks, PA for 8/6/202 . Daughter does not want patient charged a No Show fee for today.    --07/22/202112:01pm- I cancelled today's appointment since patient was seen in the ER for this problem. Shea Stakes

## 2019-08-01 NOTE — Progress Notes (Signed)
Radiation Oncology Follow up Note  Name: Samantha Henry   Date:   08/01/2019 MRN:  [id number] DOB: [birth year]    This 74 y.o. female presents to the clinic today for 54-month follow-up status post SBRT to her left upper lobe for stage I non-small cell lung cancer.  REFERRING PROVIDER: Birdie Sons, MD  HPI: Patient is a 74 year old female now out 10 months having completed SBRT to her left upper lobe for stage I non-small cell lung cancer seen today in routine follow-up she is doing well..  She specifically denies cough hemoptysis or chest tightness..  She is having problems with some apparent cellulitis or lower extremity infection secondary to her problems with diabetes.  She is using oxygen at home.  Recent CT scan of the chest which I have reviewed shows bandlike interval development of opacity and consolidation in the peripheral left upper lobe consistent with radiation changes.  The underlying cystic lesion is not clearly appreciated on this background.  COMPLICATIONS OF TREATMENT: none  FOLLOW UP COMPLIANCE: keeps appointments   PHYSICAL EXAM:  BP (!) 132/55 (BP Location: Left Arm, Patient Position: Sitting, Cuff Size: Normal)   Pulse 66   Temp (!) 96.9 F (36.1 C) (Tympanic)   Resp 20   Wt 178 lb 14.4 oz (81.1 kg)   SpO2 99%   BMI 28.88 kg/m  Patient is obvious lower extremity infection slight edema.  Well-developed well-nourished patient in NAD. HEENT reveals PERLA, EOMI, discs not visualized.  Oral cavity is clear. No oral mucosal lesions are identified. Neck is clear without evidence of cervical or supraclavicular adenopathy. Lungs are clear to A&P. Cardiac examination is essentially unremarkable with regular rate and rhythm without murmur rub or thrill. Abdomen is benign with no organomegaly or masses noted. Motor sensory and DTR levels are equal and symmetric in the upper and lower extremities. Cranial nerves II through XII are grossly intact. Proprioception is intact. No  peripheral adenopathy or edema is identified. No motor or sensory levels are noted. Crude visual fields are within normal range.  RADIOLOGY RESULTS: CT scans reviewed compatible with above-stated findings  PLAN: Present time from the pulmonary standpoint patient is stable.  I am pleased with her overall progress and her CT findings.  I have asked to see her back in 6 months with a CT scan of the chest at that time.  Patient knows to call with any concerns in the meantime she continues close follow-up care with family practice.  I would like to take this opportunity to thank you for allowing me to participate in the care of your patient.Noreene Filbert, MD

## 2019-08-07 ENCOUNTER — Ambulatory Visit: Payer: Self-pay | Admitting: Family Medicine

## 2019-08-09 NOTE — Progress Notes (Signed)
Established patient visit   Patient: Samantha Henry   DOB: Sep 25, 1945   74 y.o. Female  MRN: [id number] Visit Date: 08/12/2019  Today's healthcare provider: Mar Daring, PA-C   Chief Complaint  Patient presents with  . Abdominal Pain   Subjective    Abdominal Pain This is a new problem. The current episode started 1 to 4 weeks ago. The onset quality is gradual. The problem occurs daily. The problem has been unchanged. The pain is located in the epigastric region. Quality: like "indigestion" The abdominal pain does not radiate. Pertinent negatives include no constipation, diarrhea, fever, flatus, hematuria, nausea or vomiting. The pain is aggravated by eating. The pain is relieved by nothing. Treatments tried: TUMS. The treatment provided no relief.     Patient Active Problem List   Diagnosis Date Noted  . Plantar wart 05/08/2019  . Venous stasis 04/29/2019  . Chronic venous insufficiency 04/29/2019  . Non-small cell lung cancer (NSCLC) ([place]) 03/01/2019  . Memory loss 01/01/2019  . Cerebrovascular disease 12/25/2018  . Carotid atherosclerosis, bilateral 12/25/2018  . Right internal carotid artery aneurysm 12/11/2018  . Systolic murmur 63/01/6008  . Senile purpura ([place]) 11/23/2018  . Iron deficiency anemia 11/23/2018  . Intention tremor 11/23/2018  . Lung nodule seen on imaging study 11/23/2018  . Aortic atherosclerosis ([place]) 07/12/2018  . Coronary artery disease 07/12/2018  . Aortic valve calcification 07/12/2018  . DDD (degenerative disc disease), cervical 02/01/2018  . Chronic bilateral back pain 02/01/2018  . Morbid obesity ([place]) 06/26/2017  . History of smoking 30 or more pack years 05/12/2017  . Status post total knee replacement using cement, left 06/14/2016  . Polyneuropathy 02/26/2015  . Allergic rhinitis 07/29/2014  . COPD (chronic obstructive pulmonary disease) ([place]) 07/25/2014  . Type 2 diabetes mellitus with neurological complications ([place])  93/23/5573  . Diverticulosis of colon 07/25/2014  . Edema 07/25/2014  . GERD (gastroesophageal reflux disease) 07/25/2014  . Hypercholesteremia 07/25/2014  . Hypertension 07/25/2014  . Hypothyroid 07/25/2014  . Cramps of lower extremity 07/25/2014  . Microalbuminuria 07/25/2014  . Nail dystrophy 07/25/2014  . Osteoarthritis of leg 07/25/2014  . Restless legs syndrome 07/25/2014  . TIA (transient ischemic attack) 07/25/2014  . Von Willebrand's disease ([place]) 07/25/2014  . OSA (obstructive sleep apnea) 10/31/2013  . History of adenomatous polyp of colon 08/26/2005   Past Medical History:  Diagnosis Date  . Arthritis    osteo  . Cancer ([place]) 1969   CERVICAL CA  . COPD (chronic obstructive pulmonary disease) ([place])   . Diabetes mellitus without complication ([place])   . Diabetic peripheral neuropathy ([place])   . Environmental and seasonal allergies   . GERD (gastroesophageal reflux disease)   . History of cervical cancer   . History of colon polyps   . Hypercholesteremia   . Hypertension   . Hypothyroidism   . Nail dystrophy   . Neuropathy   . Proteinuria   . Restless legs syndrome   . Sleep apnea   . TIA (transient ischemic attack)   . Von Willebrand's disease [facility])        Medications: Outpatient Medications Prior to Visit  Medication Sig  . acetaminophen (TYLENOL) 650 MG CR tablet Take 500 mg by mouth every 8 (eight) hours as needed for pain.   Marland Kitchen albuterol (VENTOLIN HFA) 108 (90 Base) MCG/ACT inhaler Inhale 2 puffs into the lungs every 6 (six) hours as needed for wheezing or shortness of breath.  . Baclofen 5 MG TABS  Take 1 tablet by mouth three times daily as needed  . Blood Glucose Monitoring Suppl (ONE TOUCH ULTRA 2) w/Device KIT Use daily to check blood sugar for type 2 diabetes E11.9  . calcium carbonate (CALCIUM 600) 600 MG TABS tablet Take 500 mg by mouth 2 (two) times daily with a meal.   . calcium carbonate (TUMS EX) 750 MG chewable tablet Chew 2 tablets by mouth 2  (two) times daily as needed for heartburn.  . cholecalciferol (VITAMIN D) 400 units TABS tablet Take 400 Units by mouth daily.  . diclofenac Sodium (VOLTAREN) 1 % GEL Voltaren 1 % topical gel  Apply to ankle twice daily.  Marland Kitchen donepezil (ARICEPT) 5 MG tablet Take 1 tablet (5 mg total) by mouth at bedtime.  . fluticasone (FLONASE) 50 MCG/ACT nasal spray Place 2 sprays into both nostrils daily.  . furosemide (LASIX) 20 MG tablet Take 40 mg by mouth daily at 2 PM.   . levothyroxine (SYNTHROID) 125 MCG tablet TAKE 1 TABLET BY MOUTH ONCE DAILY BEFORE BREAKFAST  . losartan (COZAAR) 50 MG tablet Take 1 tablet by mouth once daily  . metFORMIN (GLUCOPHAGE-XR) 500 MG 24 hr tablet TAKE 2 TABLETS BY MOUTH IN THE MORNING AND 2 TABLETS IN THE EVENING  . montelukast (SINGULAIR) 10 MG tablet Take 10 mg by mouth at bedtime.  . MULTIPLE VITAMIN PO Take 1 tablet by mouth daily.  . Omega-3 Fatty Acids (FISH OIL BURP-LESS) 1000 MG CAPS Take 1 tablet by mouth 2 (two) times daily.   Glory Rosebush ULTRA test strip USE TEST STRIP(S) TO CHECK BLOOD SUGAR DAILY FOR TYPE 2 DIABETES  . OXYGEN Inhale 3 L into the lungs. Used at night and PRN during the day  . potassium chloride SA (KLOR-CON) 20 MEQ tablet Take 1 tablet (20 mEq total) by mouth daily.  . rosuvastatin (CRESTOR) 20 MG tablet Take 1 tablet by mouth once daily  . trolamine salicylate (ASPERCREME) 10 % cream Apply 1 application topically as needed for muscle pain.   . vitamin B-12 (CYANOCOBALAMIN) 50 MCG tablet Take 50 mcg by mouth daily.  . [DISCONTINUED] RABEprazole (ACIPHEX) 20 MG tablet Take 1 tablet (20 mg total) by mouth daily.  . [DISCONTINUED] TRELEGY ELLIPTA 100-62.5-25 MCG/INH AEPB Inhale 1 puff by mouth once daily  . albuterol (ACCUNEB) 0.63 MG/3ML nebulizer solution Inhale into the lungs. Take 3 mLs (0.63 mg total) by nebulization every 6 (six) hours as needed for Wheezing  . [DISCONTINUED] amoxicillin (AMOXIL) 875 MG tablet Take 1 tablet (875 mg total) by  mouth 2 (two) times daily.  . [DISCONTINUED] aspirin 81 MG tablet Take 1 tablet by mouth daily. (Patient not taking: Reported on 07/16/2019)  . [DISCONTINUED] SPIRIVA RESPIMAT 2.5 MCG/ACT AERS SMARTSIG:2 Spray(s) By Mouth Daily (Patient not taking: Reported on 07/16/2019)   No facility-administered medications prior to visit.    Review of Systems  Constitutional: Negative for fever.  Respiratory: Negative.   Cardiovascular: Negative.   Gastrointestinal: Positive for abdominal pain. Negative for constipation, diarrhea, flatus, nausea and vomiting.  Genitourinary: Negative for hematuria.    Last CBC Lab Results  Component Value Date   WBC 5.0 07/29/2019   HGB 12.3 07/29/2019   HCT 36.7 07/29/2019   MCV 89.3 07/29/2019   MCH 29.9 07/29/2019   RDW 13.8 07/29/2019   PLT 173 40/34/7425   Last metabolic panel Lab Results  Component Value Date   GLUCOSE 137 (H) 07/29/2019   NA 136 07/29/2019   K 3.9 07/29/2019  CL 104 07/29/2019   CO2 28 07/29/2019   BUN 10 07/29/2019   CREATININE 0.69 07/29/2019   GFRNONAA >60 07/29/2019   GFRAA >60 07/29/2019   CALCIUM 9.2 07/29/2019   PHOS 2.6 (L) 01/01/2019   PROT 7.1 07/29/2019   ALBUMIN 4.0 07/29/2019   LABGLOB 2.1 07/16/2019   AGRATIO 2.1 07/16/2019   BILITOT 0.5 07/29/2019   ALKPHOS 47 07/29/2019   AST 25 07/29/2019   ALT 17 07/29/2019   ANIONGAP 4 (L) 07/29/2019      Objective    BP (!) 100/59 (BP Location: Left Arm, Patient Position: Sitting, Cuff Size: Large)   Pulse 57   Temp 97.9 F (36.6 C) (Oral)   Resp 16   Wt 176 lb 3.2 oz (79.9 kg)   BMI 28.44 kg/m  BP Readings from Last 3 Encounters:  08/12/19 (!) 100/59  08/01/19 (!) 132/55  07/29/19 (!) 149/59   Wt Readings from Last 3 Encounters:  08/12/19 176 lb 3.2 oz (79.9 kg)  08/01/19 178 lb 14.4 oz (81.1 kg)  07/29/19 182 lb 11 oz (82.9 kg)      Physical Exam Vitals reviewed.  Constitutional:      General: She is not in acute distress.    Appearance: Normal  appearance. She is well-developed. She is obese. She is not ill-appearing or diaphoretic.  Cardiovascular:     Rate and Rhythm: Normal rate and regular rhythm.     Heart sounds: Normal heart sounds. No murmur heard.  No friction rub. No gallop.   Pulmonary:     Effort: Pulmonary effort is normal. No respiratory distress.     Breath sounds: Normal breath sounds. No wheezing or rales.  Abdominal:     General: Bowel sounds are normal. There is no distension.     Palpations: Abdomen is soft. There is no mass.     Tenderness: There is abdominal tenderness in the epigastric area. There is no guarding or rebound. Negative signs include Murphy's sign and McBurney's sign.  Skin:    General: Skin is warm and dry.  Neurological:     Mental Status: She is alert and oriented to person, place, and time.       No results found for any visits on 08/12/19.  Assessment & Plan     1. Other acute gastritis without hemorrhage Suspect possible gastritis vs PUD. Will start protonix as below. F/U in 2-4 weeks to see if symptoms improving. If not, will get CT abd/pelvis and consider referral to GI if not improving.  - pantoprazole (PROTONIX) 40 MG tablet; Take 1 tablet (40 mg total) by mouth daily.  Dispense: 30 tablet; Refill: 3  Return in about 4 weeks (around 09/09/2019) for Gastritis.      Reynolds Bowl, PA-C, have reviewed all documentation for this visit. The documentation on 09/01/19 for the exam, diagnosis, procedures, and orders are all accurate and complete.   Rubye Beach  [facility] [phone] (phone) [phone] (fax)  [place]

## 2019-08-12 ENCOUNTER — Other Ambulatory Visit: Payer: Self-pay

## 2019-08-12 ENCOUNTER — Encounter: Payer: Self-pay | Admitting: Physician Assistant

## 2019-08-12 ENCOUNTER — Ambulatory Visit (INDEPENDENT_AMBULATORY_CARE_PROVIDER_SITE_OTHER): Payer: Medicare Other | Admitting: Physician Assistant

## 2019-08-12 VITALS — BP 100/59 | HR 57 | Temp 97.9°F | Resp 16 | Wt 176.2 lb

## 2019-08-12 DIAGNOSIS — K2900 Acute gastritis without bleeding: Secondary | ICD-10-CM

## 2019-08-12 MED ORDER — PANTOPRAZOLE SODIUM 40 MG PO TBEC: 40.0000 mg | DELAYED_RELEASE_TABLET | Freq: Every day | ORAL | 3 refills | Status: DC

## 2019-08-12 NOTE — Patient Instructions (Addendum)
Stop Aciphex (rabeprazole) and start Protonx (Pantoprazole)  Gastritis, Adult  Gastritis is swelling (inflammation) of the stomach. Gastritis can develop quickly (acute). It can also develop slowly over time (chronic). It is important to get help for this condition. If you do not get help, your stomach can bleed, and you can get sores (ulcers) in your stomach. What are the causes? This condition may be caused by:  Germs that get to your stomach.  Drinking too much alcohol.  Medicines you are taking.  Too much acid in the stomach.  A disease of the intestines or stomach.  Stress.  An allergic reaction.  Crohn's disease.  Some cancer treatments (radiation). Sometimes the cause of this condition is not known. What are the signs or symptoms? Symptoms of this condition include:  Pain in your stomach.  A burning feeling in your stomach.  Feeling sick to your stomach (nauseous).  Throwing up (vomiting).  Feeling too full after you eat.  Weight loss.  Bad breath.  Throwing up blood.  Blood in your poop (stool). How is this diagnosed? This condition may be diagnosed with:  Your medical history and symptoms.  A physical exam.  Tests. These can include: ? Blood tests. ? Stool tests. ? A procedure to look inside your stomach (upper endoscopy). ? A test in which a sample of tissue is taken for testing (biopsy). How is this treated? Treatment for this condition depends on what caused it. You may be given:  Antibiotic medicine, if your condition was caused by germs.  H2 blockers and similar medicines, if your condition was caused by too much acid. Follow these instructions at home: Medicines  Take over-the-counter and prescription medicines only as told by your doctor.  If you were prescribed an antibiotic medicine, take it as told by your doctor. Do not stop taking it even if you start to feel better. Eating and drinking   Eat small meals often, instead of  large meals.  Avoid foods and drinks that make your symptoms worse.  Drink enough fluid to keep your pee (urine) pale yellow. Alcohol use  Do not drink alcohol if: ? Your doctor tells you not to drink. ? You are pregnant, may be pregnant, or are planning to become pregnant.  If you drink alcohol: ? Limit your use to:  0-1 drink a day for women.  0-2 drinks a day for men. ? Be aware of how much alcohol is in your drink. In the [place] one drink equals one 12 oz bottle of beer (355 mL), one 5 oz glass of wine (148 mL), or one 1 oz glass of hard liquor (44 mL). General instructions  Talk with your doctor about ways to manage stress. You can exercise or do deep breathing, meditation, or yoga.  Do not smoke or use products that have nicotine or tobacco. If you need help quitting, ask your doctor.  Keep all follow-up visits as told by your doctor. This is important. Contact a doctor if:  Your symptoms get worse.  Your symptoms go away and then come back. Get help right away if:  You throw up blood or something that looks like coffee grounds.  You have black or dark red poop.  You throw up any time you try to drink fluids.  Your stomach pain gets worse.  You have a fever.  You do not feel better after one week. Summary  Gastritis is swelling (inflammation) of the stomach.  You must get help for this condition.  If you do not get help, your stomach can bleed, and you can get sores (ulcers).  This condition is diagnosed with medical history, physical exam, or tests.  You can be treated with medicines for germs or medicines to block too much acid in your stomach. This information is not intended to replace advice given to you by your health care provider. Make sure you discuss any questions you have with your health care provider. Document Revised: 05/16/2017 Document Reviewed: 05/16/2017 Elsevier Patient Education  [place].   Pantoprazole tablets What is this  medicine? PANTOPRAZOLE (pan TOE pra zole) prevents the production of acid in the stomach. It is used to treat gastroesophageal reflux disease (GERD), inflammation of the esophagus, and Zollinger-Ellison syndrome. This medicine may be used for other purposes; ask your health care provider or pharmacist if you have questions. COMMON BRAND NAME(S): Protonix What should I tell my health care provider before I take this medicine? They need to know if you have any of these conditions:  liver disease  low levels of magnesium in the blood  lupus  an unusual or allergic reaction to omeprazole, lansoprazole, pantoprazole, rabeprazole, other medicines, foods, dyes, or preservatives  pregnant or trying to get pregnant  breast-feeding How should I use this medicine? Take this medicine by mouth. Swallow the tablets whole with a drink of water. Follow the directions on the prescription label. Do not crush, break, or chew. Take your medicine at regular intervals. Do not take your medicine more often than directed. Talk to your pediatrician regarding the use of this medicine in children. While this drug may be prescribed for children as young as 5 years for selected conditions, precautions do apply. Overdosage: If you think you have taken too much of this medicine contact a poison control center or emergency room at once. NOTE: This medicine is only for you. Do not share this medicine with others. What if I miss a dose? If you miss a dose, take it as soon as you can. If it is almost time for your next dose, take only that dose. Do not take double or extra doses. What may interact with this medicine? Do not take this medicine with any of the following medications:  atazanavir  nelfinavir This medicine may also interact with the following medications:  ampicillin  delavirdine  erlotinib  iron salts  medicines for fungal infections like ketoconazole, itraconazole and  voriconazole  methotrexate  mycophenolate mofetil  warfarin This list may not describe all possible interactions. Give your health care provider a list of all the medicines, herbs, non-prescription drugs, or dietary supplements you use. Also tell them if you smoke, drink alcohol, or use illegal drugs. Some items may interact with your medicine. What should I watch for while using this medicine? It can take several days before your stomach pain gets better. Check with your doctor or health care provider if your condition does not start to get better, or if it gets worse. This medicine may cause serious skin reactions. They can happen weeks to months after starting the medicine. Contact your health care provider right away if you notice fevers or flu-like symptoms with a rash. The rash may be red or purple and then turn into blisters or peeling of the skin. Or, you might notice a red rash with swelling of the face, lips or lymph nodes in your neck or under your arms. You may need blood work done while you are taking this medicine. This medicine may  cause a decrease in vitamin B12. You should make sure that you get enough vitamin B12 while you are taking this medicine. Discuss the foods you eat and the vitamins you take with your health care provider. What side effects may I notice from receiving this medicine? Side effects that you should report to your doctor or health care professional as soon as possible:   allergic reactions like skin rash, itching or hives, swelling of the face, lips, or tongue   bone, muscle or joint pain   breathing problems   chest pain or chest tightness   dark yellow or brown urine   dizziness   fast, irregular heartbeat   feeling faint or lightheaded   fever or sore throat   muscle spasm   palpitations   rash on cheeks or arms that gets worse in the sun   redness, blistering, peeling or loosening of the skin, including inside the  mouth   seizures  stomach polyps   tremors   unusual bleeding or bruising   unusually weak or tired   yellowing of the eyes or skin Side effects that usually do not require medical attention (report to your doctor or health care professional if they continue or are bothersome):   constipation   diarrhea   dry mouth   headache   nausea This list may not describe all possible side effects. Call your doctor for medical advice about side effects. You may report side effects to FDA at 1-800-FDA-1088. Where should I keep my medicine? Keep out of the reach of children. Store at room temperature between 15 and 30 degrees C (59 and 86 degrees F). Protect from light and moisture. Throw away any unused medicine after the expiration date. NOTE: This sheet is a summary. It may not cover all possible information. If you have questions about this medicine, talk to your doctor, pharmacist, or health care provider.  2020 Elsevier/Gold Standard (2018-04-06 13:18:32)

## 2019-08-16 ENCOUNTER — Telehealth: Payer: Self-pay

## 2019-08-16 ENCOUNTER — Ambulatory Visit: Payer: Medicare Other | Admitting: Physician Assistant

## 2019-08-16 DIAGNOSIS — R112 Nausea with vomiting, unspecified: Secondary | ICD-10-CM

## 2019-08-16 NOTE — Telephone Encounter (Signed)
Patient's daughter [name] given message below, she verbalized understanding.

## 2019-08-16 NOTE — Telephone Encounter (Signed)
Will order Ct Abd/pelvis

## 2019-08-16 NOTE — Telephone Encounter (Signed)
LMTCB-If patient's daughter [name] calls back ok for [facility] nurse to relay message that Ct Abd/pelvis has been ordered and the imaging center will call patient with appointment date and time.

## 2019-08-16 NOTE — Telephone Encounter (Signed)
Copied from [place] [id number]. Topic: General - Other >> Aug 16, 2019 11:24 AM Jodie Echevaria wrote: Reason for CRM: Patient daughter [name] called to speak to Dr Marlyn Corporal about getting the patient an ultrasound of her abdomen to find out what is causing her to have so much pain. Patient is taking medication recently given but still complain of pain in her stomach. Please call [name] at Ph# [phone]

## 2019-08-23 ENCOUNTER — Telehealth: Payer: Medicare Other

## 2019-08-23 NOTE — Chronic Care Management (AMB) (Deleted)
Chronic Care Management Pharmacy  Name: Samantha Henry  MRN: [id number] DOB: 03/15/1945  Chief Complaint/ HPI  Samantha Henry,  74 y.o. , female presents for their Follow-Up CCM visit with the clinical pharmacist via telephone due to COVID-19 Pandemic.  PCP : Virginia Crews, MD  Their chronic conditions include: {CHL AMB CHRONIC MEDICAL CONDITIONS:[phone]}  Office Visits:***  Consult Visit:***  Medications: Outpatient Encounter Medications as of 08/23/2019  Medication Sig  . acetaminophen (TYLENOL) 650 MG CR tablet Take 500 mg by mouth every 8 (eight) hours as needed for pain.   Marland Kitchen albuterol (ACCUNEB) 0.63 MG/3ML nebulizer solution Inhale into the lungs. Take 3 mLs (0.63 mg total) by nebulization every 6 (six) hours as needed for Wheezing  . albuterol (VENTOLIN HFA) 108 (90 Base) MCG/ACT inhaler Inhale 2 puffs into the lungs every 6 (six) hours as needed for wheezing or shortness of breath.  . Baclofen 5 MG TABS Take 1 tablet by mouth three times daily as needed  . Blood Glucose Monitoring Suppl (ONE TOUCH ULTRA 2) w/Device KIT Use daily to check blood sugar for type 2 diabetes E11.9  . calcium carbonate (CALCIUM 600) 600 MG TABS tablet Take 500 mg by mouth 2 (two) times daily with a meal.   . calcium carbonate (TUMS EX) 750 MG chewable tablet Chew 2 tablets by mouth 2 (two) times daily as needed for heartburn.  . cholecalciferol (VITAMIN D) 400 units TABS tablet Take 400 Units by mouth daily.  . diclofenac Sodium (VOLTAREN) 1 % GEL Voltaren 1 % topical gel  Apply to ankle twice daily.  Marland Kitchen donepezil (ARICEPT) 5 MG tablet Take 1 tablet (5 mg total) by mouth at bedtime.  . fluticasone (FLONASE) 50 MCG/ACT nasal spray Place 2 sprays into both nostrils daily.  . furosemide (LASIX) 20 MG tablet Take 40 mg by mouth daily at 2 PM.   . levothyroxine (SYNTHROID) 125 MCG tablet TAKE 1 TABLET BY MOUTH ONCE DAILY BEFORE BREAKFAST  . losartan (COZAAR) 50 MG tablet Take 1 tablet by  mouth once daily  . metFORMIN (GLUCOPHAGE-XR) 500 MG 24 hr tablet TAKE 2 TABLETS BY MOUTH IN THE MORNING AND 2 TABLETS IN THE EVENING  . montelukast (SINGULAIR) 10 MG tablet Take 10 mg by mouth at bedtime.  . MULTIPLE VITAMIN PO Take 1 tablet by mouth daily.  . Omega-3 Fatty Acids (FISH OIL BURP-LESS) 1000 MG CAPS Take 1 tablet by mouth 2 (two) times daily.   Glory Rosebush ULTRA test strip USE TEST STRIP(S) TO CHECK BLOOD SUGAR DAILY FOR TYPE 2 DIABETES  . OXYGEN Inhale 3 L into the lungs. Used at night and PRN during the day  . pantoprazole (PROTONIX) 40 MG tablet Take 1 tablet (40 mg total) by mouth daily.  . potassium chloride SA (KLOR-CON) 20 MEQ tablet Take 1 tablet (20 mEq total) by mouth daily.  . rosuvastatin (CRESTOR) 20 MG tablet Take 1 tablet by mouth once daily  . TRELEGY ELLIPTA 100-62.5-25 MCG/INH AEPB Inhale 1 puff by mouth once daily  . trolamine salicylate (ASPERCREME) 10 % cream Apply 1 application topically as needed for muscle pain.   . vitamin B-12 (CYANOCOBALAMIN) 50 MCG tablet Take 50 mcg by mouth daily.   No facility-administered encounter medications on file as of 08/23/2019.     Current Diagnosis/Assessment:  Goals Addressed   None     Medication Management   Pt uses *** pharmacy for all medications Uses pill box? {Yes or If no, why not?:20788}  Pt endorses ***% compliance  We discussed:  Not taking aspirin Trelegy still working Nasal sprays? How's memory?  Plan  {US Pharmacy LTKC:30172}    Follow up: *** month phone visit  ***

## 2019-08-27 ENCOUNTER — Other Ambulatory Visit: Payer: Self-pay | Admitting: Family Medicine

## 2019-08-27 DIAGNOSIS — J449 Chronic obstructive pulmonary disease, unspecified: Secondary | ICD-10-CM | POA: Diagnosis not present

## 2019-08-27 NOTE — Telephone Encounter (Signed)
Requested medication (s) are due for refill today: yes  Requested medication (s) are on the active medication list: yes  Last refill:  07/07/2019  Future visit scheduled: yes  Notes to clinic:  Medication not assigned to a protocol, review manually   Requested Prescriptions  Pending Prescriptions Disp Refills   [place] 100-62.5-25 MCG/INH AEPB [Pharmacy Med Name: Trelegy Ellipta 100-62.5-25 MCG/INH Inhalation Aerosol Powder Breath Activated] 60 each 0    Sig: INHALE 1 PUFF ONCE DAILY      Off-Protocol Failed - 08/27/2019 10:10 AM      Failed - Medication not assigned to a protocol, review manually.      Passed - Valid encounter within last 12 months    Recent Outpatient Visits           2 weeks ago Other acute gastritis without hemorrhage   [facility] [place], Clearnce Sorrel, [place]   1 month ago Bilateral lower extremity edema   [place], [place]   3 months ago Seasonal allergic rhinitis, unspecified trigger   [facility] [place], Dionne Bucy, [place]   4 months ago Venous stasis dermatitis of both lower extremities   [facility] [place], Dionne Bucy, [place]   7 months ago Iron deficiency anemia, unspecified iron deficiency anemia type   [facility] Birdie Sons, MD       Future Appointments             In 1 week [place], Clearnce Sorrel, PA-C [facility], [facility]

## 2019-08-28 ENCOUNTER — Ambulatory Visit
Admission: RE | Admit: 2019-08-28 | Discharge: 2019-08-28 | Disposition: A | Discharge status: Home / Self Care | Payer: Medicare Other | Source: Ambulatory Visit | Attending: Physician Assistant | Admitting: Physician Assistant

## 2019-08-28 ENCOUNTER — Other Ambulatory Visit: Payer: Self-pay

## 2019-08-28 DIAGNOSIS — K5730 Diverticulosis of large intestine without perforation or abscess without bleeding: Secondary | ICD-10-CM | POA: Insufficient documentation

## 2019-08-28 DIAGNOSIS — M47816 Spondylosis without myelopathy or radiculopathy, lumbar region: Secondary | ICD-10-CM | POA: Insufficient documentation

## 2019-08-28 DIAGNOSIS — N281 Cyst of kidney, acquired: Secondary | ICD-10-CM | POA: Insufficient documentation

## 2019-08-28 DIAGNOSIS — I700 Atherosclerosis of aorta: Secondary | ICD-10-CM | POA: Insufficient documentation

## 2019-08-28 DIAGNOSIS — R112 Nausea with vomiting, unspecified: Secondary | ICD-10-CM | POA: Diagnosis not present

## 2019-08-28 DIAGNOSIS — M47817 Spondylosis without myelopathy or radiculopathy, lumbosacral region: Secondary | ICD-10-CM | POA: Diagnosis not present

## 2019-08-28 MED ORDER — IOHEXOL 300 MG/ML  SOLN
100.0000 mL | Freq: Once | INTRAMUSCULAR | Status: AC | PRN
  Administered 2019-08-28: 100 mL via INTRAVENOUS

## 2019-08-29 ENCOUNTER — Telehealth: Payer: Self-pay

## 2019-08-29 DIAGNOSIS — R110 Nausea: Secondary | ICD-10-CM

## 2019-08-29 DIAGNOSIS — R1013 Epigastric pain: Secondary | ICD-10-CM

## 2019-08-29 NOTE — Telephone Encounter (Signed)
Patient returned call- results given to her per PCP- patient inquires what is next step in abdominal pain management/testing. Told patient I would send her inquiry to provider for response.

## 2019-08-29 NOTE — Addendum Note (Signed)
Addended by: Doristine Devoid on: 08/29/2019 02:35 PM   Modules accepted: Orders

## 2019-08-29 NOTE — Telephone Encounter (Signed)
-----   Message from Mar Daring, [place] sent at 08/29/2019 10:52 AM EDT ----- CT is essentially unremarkable for any source of diarrhea, abdominal pain and nausea. There is some diverticula noted in the sigmoid and descending colon, but none are infected or inflamed. There is incidental finding to have moderate to markedly severe aortic atherosclerosis and iliac atherosclerosis. You are on the best medical management of this. Do you have any fatigue or cramping in your thighs or buttocks when you walk? If so, I can refer to vascular surgery to work this up. There is also degenerative changes (arthritic changes) noted in the low spine.

## 2019-08-29 NOTE — Telephone Encounter (Signed)
Patient agreed to GI referral, she reports that she still in pain.

## 2019-08-29 NOTE — Telephone Encounter (Signed)
Phone just ringing/no answer-If patient calls back ok for [facility] nurse to give results.

## 2019-08-29 NOTE — Telephone Encounter (Signed)
If she is still having pain would refer to GI

## 2019-08-29 NOTE — Addendum Note (Signed)
Addended by: Mar Daring on: 08/29/2019 02:55 PM   Modules accepted: Orders

## 2019-09-03 ENCOUNTER — Telehealth: Payer: Self-pay | Admitting: Family Medicine

## 2019-09-03 NOTE — Telephone Encounter (Signed)
Patient's daughter Suanne Marker requesting call back from clinical staff. She has questions about dementia and concerns that she would like to address.

## 2019-09-03 NOTE — Telephone Encounter (Signed)
Suanne Marker called to voice her concerns about her mother. She reports that Samantha Henry is forgetting things like where or who she lives with. Suanne Marker is aware that she is not on the DPR, and so no medical information was given to her. Suanne Marker reports that she will have her sister wendy update at next office visit. Suanne Marker also reports that patient should not be diving anymore. Patient has appt 09/05/2019.

## 2019-09-04 DIAGNOSIS — Z9981 Dependence on supplemental oxygen: Secondary | ICD-10-CM | POA: Diagnosis not present

## 2019-09-04 DIAGNOSIS — R1010 Upper abdominal pain, unspecified: Secondary | ICD-10-CM | POA: Diagnosis not present

## 2019-09-04 DIAGNOSIS — R0600 Dyspnea, unspecified: Secondary | ICD-10-CM | POA: Diagnosis not present

## 2019-09-04 DIAGNOSIS — J439 Emphysema, unspecified: Secondary | ICD-10-CM | POA: Diagnosis not present

## 2019-09-05 ENCOUNTER — Other Ambulatory Visit: Payer: Self-pay

## 2019-09-05 ENCOUNTER — Ambulatory Visit (INDEPENDENT_AMBULATORY_CARE_PROVIDER_SITE_OTHER): Payer: Medicare Other | Admitting: Physician Assistant

## 2019-09-05 ENCOUNTER — Encounter: Payer: Self-pay | Admitting: Physician Assistant

## 2019-09-05 VITALS — BP 102/57 | HR 63 | Temp 97.9°F | Resp 16 | Wt 173.8 lb

## 2019-09-05 DIAGNOSIS — I671 Cerebral aneurysm, nonruptured: Secondary | ICD-10-CM | POA: Diagnosis not present

## 2019-09-05 DIAGNOSIS — R413 Other amnesia: Secondary | ICD-10-CM | POA: Diagnosis not present

## 2019-09-05 DIAGNOSIS — R109 Unspecified abdominal pain: Secondary | ICD-10-CM | POA: Diagnosis not present

## 2019-09-05 MED ORDER — DONEPEZIL HCL 10 MG PO TABS: 10.0000 mg | ORAL_TABLET | Freq: Every day | ORAL | 5 refills | Status: DC

## 2019-09-05 NOTE — Progress Notes (Signed)
Established patient visit   Patient: Samantha Henry   DOB: 08-May-1945   74 y.o. Female  MRN: [id number] Visit Date: 09/05/2019  Today's healthcare provider: Mar Daring, PA-C   No chief complaint on file.  Subjective    HPI  Follow up for gastritis  The patient was last seen for this 2-4 weeks weeks ago. Changes made at last visit include protonix and didn't help. CT was ordered and it was normal.  She reports excellent compliance with treatment. She feels that condition is Unchanged. She is not having side effects. Reports that she still has the abdominal pain.   Also having progression of memory loss. Is currently on donepezil 73m at bedtime. Reports tolerating well. Does have alzheimer's dementia in family.  Has known aneurysm in the right internal carotid artery. Was seen by Neurosurgery last December and had decided to wait on repair due to patient's co-morbidities. Daughter states Dr. FRaul Del pulmonologist, would like information from the neurosurgeon about the plan before he clears her for the procedure to repair.  -----------------------------------------------------------------------------------------  Patient Active Problem List   Diagnosis Date Noted  . Plantar wart 05/08/2019  . Venous stasis 04/29/2019  . Chronic venous insufficiency 04/29/2019  . Non-small cell lung cancer (NSCLC) ([place] 03/01/2019  . Memory loss 01/01/2019  . Cerebrovascular disease 12/25/2018  . Carotid atherosclerosis, bilateral 12/25/2018  . Right internal carotid artery aneurysm 12/11/2018  . Systolic murmur 167/34/1937 . Senile purpura ([facility] 11/23/2018  . Iron deficiency anemia 11/23/2018  . Intention tremor 11/23/2018  . Lung nodule seen on imaging study 11/23/2018  . Aortic atherosclerosis ([facility] 07/12/2018  . Coronary artery disease 07/12/2018  . Aortic valve calcification 07/12/2018  . DDD (degenerative disc disease), cervical 02/01/2018  . Chronic bilateral back  pain 02/01/2018  . Morbid obesity ([place] 06/26/2017  . History of smoking 30 or more pack years 05/12/2017  . Status post total knee replacement using cement, left 06/14/2016  . Polyneuropathy 02/26/2015  . Allergic rhinitis 07/29/2014  . COPD (chronic obstructive pulmonary disease) ([facility] 07/25/2014  . Type 2 diabetes mellitus with neurological complications ([place] 090/24/0973 . Diverticulosis of colon 07/25/2014  . Edema 07/25/2014  . GERD (gastroesophageal reflux disease) 07/25/2014  . Hypercholesteremia 07/25/2014  . Hypertension 07/25/2014  . Hypothyroid 07/25/2014  . Cramps of lower extremity 07/25/2014  . Microalbuminuria 07/25/2014  . Nail dystrophy 07/25/2014  . Osteoarthritis of leg 07/25/2014  . Restless legs syndrome 07/25/2014  . TIA (transient ischemic attack) 07/25/2014  . Von Willebrand's disease ([facility] 07/25/2014  . OSA (obstructive sleep apnea) 10/31/2013  . History of adenomatous polyp of colon 08/26/2005   Past Medical History:  Diagnosis Date  . Arthritis    osteo  . Cancer ([place] 1969   CERVICAL CA  . COPD (chronic obstructive pulmonary disease) ([facility]   . Diabetes mellitus without complication ([facility]   . Diabetic peripheral neuropathy ([facility]   . Environmental and seasonal allergies   . GERD (gastroesophageal reflux disease)   . History of cervical cancer   . History of colon polyps   . Hypercholesteremia   . Hypertension   . Hypothyroidism   . Nail dystrophy   . Neuropathy   . Proteinuria   . Restless legs syndrome   . Sleep apnea   . TIA (transient ischemic attack)   . Von Willebrand's disease ([facility]        Medications: Outpatient Medications Prior to Visit  Medication Sig  . acetaminophen (TYLENOL) 650 MG  CR tablet Take 500 mg by mouth every 8 (eight) hours as needed for pain.   Marland Kitchen albuterol (VENTOLIN HFA) 108 (90 Base) MCG/ACT inhaler Inhale 2 puffs into the lungs every 6 (six) hours as needed for wheezing or shortness of breath.  . Baclofen 5 MG  TABS Take 1 tablet by mouth three times daily as needed  . Blood Glucose Monitoring Suppl (ONE TOUCH ULTRA 2) w/Device KIT Use daily to check blood sugar for type 2 diabetes E11.9  . calcium carbonate (CALCIUM 600) 600 MG TABS tablet Take 500 mg by mouth 2 (two) times daily with a meal.   . calcium carbonate (TUMS EX) 750 MG chewable tablet Chew 2 tablets by mouth 2 (two) times daily as needed for heartburn.  . cholecalciferol (VITAMIN D) 400 units TABS tablet Take 400 Units by mouth daily.  . diclofenac Sodium (VOLTAREN) 1 % GEL Voltaren 1 % topical gel  Apply to ankle twice daily.  . fluticasone (FLONASE) 50 MCG/ACT nasal spray Place 2 sprays into both nostrils daily.  . furosemide (LASIX) 20 MG tablet Take 40 mg by mouth daily at 2 PM.   . levothyroxine (SYNTHROID) 125 MCG tablet TAKE 1 TABLET BY MOUTH ONCE DAILY BEFORE BREAKFAST  . losartan (COZAAR) 50 MG tablet Take 1 tablet by mouth once daily  . metFORMIN (GLUCOPHAGE-XR) 500 MG 24 hr tablet TAKE 2 TABLETS BY MOUTH IN THE MORNING AND 2 TABLETS IN THE EVENING  . montelukast (SINGULAIR) 10 MG tablet Take 10 mg by mouth at bedtime.  . MULTIPLE VITAMIN PO Take 1 tablet by mouth daily.  . Omega-3 Fatty Acids (FISH OIL BURP-LESS) 1000 MG CAPS Take 1 tablet by mouth 2 (two) times daily.   Glory Rosebush ULTRA test strip USE TEST STRIP(S) TO CHECK BLOOD SUGAR DAILY FOR TYPE 2 DIABETES  . OXYGEN Inhale 3 L into the lungs. Used at night and PRN during the day  . pantoprazole (PROTONIX) 40 MG tablet Take 1 tablet (40 mg total) by mouth daily.  . potassium chloride SA (KLOR-CON) 20 MEQ tablet Take 1 tablet (20 mEq total) by mouth daily.  . rosuvastatin (CRESTOR) 20 MG tablet Take 1 tablet by mouth once daily  . TRELEGY ELLIPTA 100-62.5-25 MCG/INH AEPB INHALE 1 PUFF ONCE DAILY  . trolamine salicylate (ASPERCREME) 10 % cream Apply 1 application topically as needed for muscle pain.   . vitamin B-12 (CYANOCOBALAMIN) 50 MCG tablet Take 50 mcg by mouth daily.    . [DISCONTINUED] donepezil (ARICEPT) 5 MG tablet Take 1 tablet (5 mg total) by mouth at bedtime.  Marland Kitchen albuterol (ACCUNEB) 0.63 MG/3ML nebulizer solution Inhale into the lungs. Take 3 mLs (0.63 mg total) by nebulization every 6 (six) hours as needed for Wheezing   No facility-administered medications prior to visit.    Review of Systems  Constitutional: Positive for appetite change and unexpected weight change (20 pounds per patient and daughter).  HENT: Negative.   Respiratory: Positive for shortness of breath (chronic). Negative for cough, chest tightness and wheezing.   Cardiovascular: Negative for chest pain, palpitations and leg swelling.  Gastrointestinal: Positive for abdominal pain and nausea. Negative for abdominal distention, diarrhea and vomiting.  Genitourinary: Negative.   Neurological:       Memory changes    Last CBC Lab Results  Component Value Date   WBC 5.0 07/29/2019   HGB 12.3 07/29/2019   HCT 36.7 07/29/2019   MCV 89.3 07/29/2019   MCH 29.9 07/29/2019   RDW 13.8 07/29/2019  PLT 173 11/94/1740   Last metabolic panel Lab Results  Component Value Date   GLUCOSE 137 (H) 07/29/2019   NA 136 07/29/2019   K 3.9 07/29/2019   CL 104 07/29/2019   CO2 28 07/29/2019   BUN 10 07/29/2019   CREATININE 0.69 07/29/2019   GFRNONAA >60 07/29/2019   GFRAA >60 07/29/2019   CALCIUM 9.2 07/29/2019   PHOS 2.6 (L) 01/01/2019   PROT 7.1 07/29/2019   ALBUMIN 4.0 07/29/2019   LABGLOB 2.1 07/16/2019   AGRATIO 2.1 07/16/2019   BILITOT 0.5 07/29/2019   ALKPHOS 47 07/29/2019   AST 25 07/29/2019   ALT 17 07/29/2019   ANIONGAP 4 (L) 07/29/2019      Objective    BP (!) 102/57 (BP Location: Left Arm, Patient Position: Sitting, Cuff Size: Large)   Pulse 63   Temp 97.9 F (36.6 C) (Oral)   Resp 16   Wt 173 lb 12.8 oz (78.8 kg)   BMI 28.05 kg/m  BP Readings from Last 3 Encounters:  09/05/19 (!) 102/57  08/12/19 (!) 100/59  08/01/19 (!) 132/55   Wt Readings from Last  3 Encounters:  09/05/19 173 lb 12.8 oz (78.8 kg)  08/12/19 176 lb 3.2 oz (79.9 kg)  08/01/19 178 lb 14.4 oz (81.1 kg)      Physical Exam Constitutional:      General: She is not in acute distress.    Appearance: Normal appearance. She is well-developed, well-groomed and overweight. She is not diaphoretic.  Cardiovascular:     Rate and Rhythm: Normal rate and regular rhythm.     Heart sounds: Normal heart sounds. No murmur heard.  No friction rub. No gallop.   Pulmonary:     Effort: Pulmonary effort is normal. No respiratory distress.     Breath sounds: Normal breath sounds. No wheezing or rales.  Abdominal:     General: Abdomen is flat. Bowel sounds are normal. There is no distension.     Palpations: Abdomen is soft. There is no mass.     Tenderness: There is generalized abdominal tenderness. There is no guarding or rebound.  Skin:    General: Skin is warm and dry.  Neurological:     Mental Status: She is alert.  Psychiatric:        Behavior: Behavior is cooperative.       No results found for any visits on 09/05/19.  Assessment & Plan     1. Stomach pain Generalized stomach pain with gradual weight loss. Initially was more tender through the epigastric region. Started Protonix. No improvements. CT scan was obtained and was unremarkable. Since CT normal and still having symptoms will refer to GI as below.  - Ambulatory referral to Gastroenterology  2. Right internal carotid artery aneurysm Patient wants to see Dr. Raphael Gibney before her f/u in December for further evaluation and consideration of repair/coiling of the right internal carotid aneurysm.  - Ambulatory referral to Neurosurgery  3. Memory loss Worsening. Suspect vascular dementia. Increase Aricept to 3m as below. Referral to Neurology for further evaluation and management.  - donepezil (ARICEPT) 10 MG tablet; Take 1 tablet (10 mg total) by mouth at bedtime.  Dispense: 30 tablet; Refill: 5 - Ambulatory referral to  Neurology  I spent approximately 60 minutes with the patient today. Over 50% of this time was spent with counseling and educating the patient.  No follow-ups on file.      IReynolds Bowl PA-C, have reviewed all documentation for this  visit. The documentation on 09/06/19 for the exam, diagnosis, procedures, and orders are all accurate and complete.   Rubye Beach  [facility] [phone] (phone) [phone] (fax)  [place]

## 2019-09-06 ENCOUNTER — Encounter: Payer: Self-pay | Admitting: Physician Assistant

## 2019-09-12 ENCOUNTER — Ambulatory Visit (INDEPENDENT_AMBULATORY_CARE_PROVIDER_SITE_OTHER): Payer: Medicare Other | Admitting: Gastroenterology

## 2019-09-12 ENCOUNTER — Ambulatory Visit
Admission: RE | Admit: 2019-09-12 | Discharge: 2019-09-12 | Disposition: A | Discharge status: Home / Self Care | Payer: Medicare Other | Source: Ambulatory Visit | Attending: Gastroenterology | Admitting: Gastroenterology

## 2019-09-12 ENCOUNTER — Other Ambulatory Visit: Payer: Self-pay

## 2019-09-12 VITALS — BP 115/66 | HR 67 | Temp 97.9°F | Ht 66.0 in | Wt 170.0 lb

## 2019-09-12 DIAGNOSIS — K581 Irritable bowel syndrome with constipation: Secondary | ICD-10-CM | POA: Diagnosis not present

## 2019-09-12 DIAGNOSIS — R197 Diarrhea, unspecified: Secondary | ICD-10-CM | POA: Diagnosis not present

## 2019-09-12 DIAGNOSIS — Z0389 Encounter for observation for other suspected diseases and conditions ruled out: Secondary | ICD-10-CM | POA: Diagnosis not present

## 2019-09-12 NOTE — Progress Notes (Signed)
Jonathon Bellows MD, MRCP(U.K) [address]  [place]  [place], [place] [address]  Main: [phone]  Fax: [phone]   Gastroenterology Consultation  Referring Provider:     Florian Buff* Primary Care Physician:  Virginia Crews, MD Primary Gastroenterologist:  Dr. Jonathon Bellows  Reason for Consultation:     Abdominal pain         HPI:   Samantha Henry is a 74 y.o. y/o female referred for consultation & management  by Dr. Brita Romp, Dionne Bucy, MD.   She has previously been established and seen by her [facility] clinic gastroenterology.  Last visit was in February 2021.  At that time was being evaluated for iron deficiency anemia and abdominal pain.  She has a history of non-small cell lung cancer.  At that point of time plan was for an EGD and colonoscopy and subsequent follow-up.  She underwent a colonoscopy by Dr. Bary Castilla which was unremarkable except for diverticulosis of colon.  An EGD was also performed at the same time which showed no gross abnormalities.  A CT scan of the abdomen was performed on 08/28/2019 that demonstrated noninflamed diverticuli within the descending and sigmoid colon.  Degenerative changes of the lumbar spine was seen in the L5-S1 region and a 1.5 cm left renal cyst was noted.  On 07/29/2019 hemoglobin was 12.3 g with an MCV of 89.3 creatinine of 0.69 and normal liver function test.  She is on Aricept for dementia.  She is presently accompanied by her daughter.  She says that for the past few months she has had generalized abdominal pain with no clear aggravating factors but relieved after good bowel movement.  At times she does not have a bowel movement for at least 2 days.  Following a bowel movement the pain is relieved.  Denies any NSAID use.  Pain is no worse by food intake.  Denies any rectal bleeding.  Over the last 2 to 3 weeks she says she has been having very soft stools 2-3 times a day.  Has woken her up at night.  It is not watery.   Symptoms of abdominal pain began even prior to initiation of Aricept.  She does not check her blood sugars so we are unaware of how well her sugars are controlled.  She is on thyroxine replacement and her last TSH was normal.   Past Medical History:  Diagnosis Date  . Arthritis    osteo  . Cancer ([place]) 1969   CERVICAL CA  . COPD (chronic obstructive pulmonary disease) ([place])   . Diabetes mellitus without complication ([place])   . Diabetic peripheral neuropathy ([place])   . Environmental and seasonal allergies   . GERD (gastroesophageal reflux disease)   . History of cervical cancer   . History of colon polyps   . Hypercholesteremia   . Hypertension   . Hypothyroidism   . Nail dystrophy   . Neuropathy   . Proteinuria   . Restless legs syndrome   . Sleep apnea   . TIA (transient ischemic attack)   . Von Willebrand's disease [facility])     Past Surgical History:  Procedure Laterality Date  . ABDOMINAL HYSTERECTOMY  1969   due to cervical cancer   . APPENDECTOMY    . BLADDER SURGERY  1997   bladder tacking  . BREAST BIOPSY Left 01/21/2015   FIBROADENOMATOUS CHANGE WITH ASSOCIATED MICROCALCIFICATIONS  . CARDIAC CATHETERIZATION    . CARPAL TUNNEL RELEASE  2010  .  colonoscopy and upper endoscopy'    . COLONOSCOPY WITH PROPOFOL N/A 02/25/2016   Procedure: COLONOSCOPY WITH PROPOFOL;  Surgeon: Lollie Sails, MD;  Location[facility] ENDOSCOPY;  Service: Endoscopy;  Laterality: N/A;  . COLONOSCOPY WITH PROPOFOL N/A 03/22/2019   Procedure: COLONOSCOPY WITH PROPOFOL;  Surgeon: Robert Bellow, MD;  Location: [facility] ENDOSCOPY;  Service: Endoscopy;  Laterality: N/A;  . ESOPHAGOGASTRODUODENOSCOPY (EGD) WITH PROPOFOL N/A 03/22/2019   Procedure: ESOPHAGOGASTRODUODENOSCOPY (EGD) WITH PROPOFOL;  Surgeon: Robert Bellow, MD;  Location: [facility] ENDOSCOPY;  Service: Endoscopy;  Laterality: N/A;  . JOINT REPLACEMENT Left 06/14/2016   KNEE  . MUSCLE BIOPSY    . OOPHORECTOMY    . TENDON RELEASE  2010    right foot, Dr. Vickki Muff   . TOTAL KNEE ARTHROPLASTY Left 06/14/2016   Procedure: TOTAL KNEE ARTHROPLASTY;  Surgeon: Corky Mull, MD;  Location: [facility] ORS;  Service: Orthopedics;  Laterality: Left;    Prior to Admission medications   Medication Sig Start Date End Date Taking? Authorizing Provider  acetaminophen (TYLENOL) 650 MG CR tablet Take 500 mg by mouth every 8 (eight) hours as needed for pain.     [provider]  albuterol (ACCUNEB) 0.63 MG/3ML nebulizer solution Inhale into the lungs. Take 3 mLs (0.63 mg total) by nebulization every 6 (six) hours as needed for Wheezing 07/30/18 07/30/19  [provider]  albuterol (VENTOLIN HFA) 108 (90 Base) MCG/ACT inhaler Inhale 2 puffs into the lungs every 6 (six) hours as needed for wheezing or shortness of breath. 05/08/19   Virginia Crews, MD  Baclofen 5 MG TABS Take 1 tablet by mouth three times daily as needed 07/01/19   Virginia Crews, MD  Blood Glucose Monitoring Suppl (ONE TOUCH ULTRA 2) w/Device KIT Use daily to check blood sugar for type 2 diabetes E11.9 01/11/18   Birdie Sons, MD  calcium carbonate (CALCIUM 600) 600 MG TABS tablet Take 500 mg by mouth 2 (two) times daily with a meal.     [provider]  calcium carbonate (TUMS EX) 750 MG chewable tablet Chew 2 tablets by mouth 2 (two) times daily as needed for heartburn.    [provider]  cholecalciferol (VITAMIN D) 400 units TABS tablet Take 400 Units by mouth daily.    [provider]  diclofenac Sodium (VOLTAREN) 1 % GEL Voltaren 1 % topical gel  Apply to ankle twice daily.    [provider]  donepezil (ARICEPT) 10 MG tablet Take 1 tablet (10 mg total) by mouth at bedtime. 09/05/19   Mar Daring, PA-C  fluticasone (FLONASE) 50 MCG/ACT nasal spray Place 2 sprays into both nostrils daily. 05/08/19   Virginia Crews, MD  furosemide (LASIX) 20 MG tablet Take 40 mg by mouth daily at 2 PM.  10/18/18   [provider]  levothyroxine (SYNTHROID) 125 MCG tablet TAKE 1 TABLET BY MOUTH ONCE DAILY BEFORE BREAKFAST 01/07/19   Birdie Sons, MD  losartan (COZAAR) 50 MG tablet Take 1 tablet by mouth once daily 06/27/18   Birdie Sons, MD  metFORMIN (GLUCOPHAGE-XR) 500 MG 24 hr tablet TAKE 2 TABLETS BY MOUTH IN THE MORNING AND 2 TABLETS IN THE EVENING 05/13/19   Birdie Sons, MD  montelukast (SINGULAIR) 10 MG tablet Take 10 mg by mouth at bedtime. 11/19/18   [provider]  MULTIPLE VITAMIN PO Take 1 tablet by mouth daily.    [provider]  Omega-3 Fatty Acids (FISH OIL  BURP-LESS) 1000 MG CAPS Take 1 tablet by mouth 2 (two) times daily.     [provider]  [facility] ULTRA test strip USE TEST STRIP(S) TO CHECK BLOOD SUGAR DAILY FOR TYPE 2 DIABETES 02/12/19   Birdie Sons, MD  OXYGEN Inhale 3 L into the lungs. Used at night and PRN during the day    [provider]  pantoprazole (PROTONIX) 40 MG tablet Take 1 tablet (40 mg total) by mouth daily. 08/12/19   Mar Daring, PA-C  potassium chloride SA (KLOR-CON) 20 MEQ tablet Take 1 tablet (20 mEq total) by mouth daily. 04/02/19   Birdie Sons, MD  rosuvastatin (CRESTOR) 20 MG tablet Take 1 tablet by mouth once daily 06/13/19   Birdie Sons, MD  TRELEGY ELLIPTA 100-62.5-25 MCG/INH AEPB INHALE 1 PUFF ONCE DAILY 08/27/19   Virginia Crews, MD  trolamine salicylate (ASPERCREME) 10 % cream Apply 1 application topically as needed for muscle pain.     [provider]  vitamin B-12 (CYANOCOBALAMIN) 50 MCG tablet Take 50 mcg by mouth daily.    [provider]    Family History  Problem Relation Age of Onset  . Alzheimer's disease Mother   . Cancer Father   . Diabetes Sister   . Colon cancer Sister   . Diabetes Brother   . Cancer Sister 52       melanoma  . Breast cancer Maternal Aunt   . Hodgkin's lymphoma Sister      Social History   Tobacco Use  . Smoking status: Former  Smoker    Packs/day: 1.50    Years: 48.00    Pack years: 72.00    Types: Cigarettes    Quit date: 01/11/2007    Years since quitting: 12.6  . Smokeless tobacco: Never Used  . Tobacco comment: smoked up to 2 ppd since 74 yo. quit in 2009  Vaping Use  . Vaping Use: Never used  Substance Use Topics  . Alcohol use: No    Alcohol/week: 0.0 standard drinks  . Drug use: No    Allergies as of 09/12/2019 - Review Complete 09/06/2019  Allergen Reaction Noted  . Aspirin  07/25/2014  . Gabapentin  02/01/2018  . Lyrica [pregabalin] Other (See Comments) 07/24/2015  . Tetracycline Rash 07/25/2014    Review of Systems:    All systems reviewed and negative except where noted in HPI.   Physical Exam:  There were no vitals taken for this visit. No LMP recorded. Patient has had a hysterectomy. Psych:  Alert and cooperative. Normal mood and affect. General:   Alert,  Well-developed, well-nourished, pleasant and cooperative in NAD Head:  Normocephalic and atraumatic. Eyes:  Sclera clear, no icterus.   Conjunctiva pink. Ears:  Normal auditory acuity. Abdomen:  Normal bowel sounds.  No bruits.  Soft, non-tender and non-distended without masses, hepatosplenomegaly or hernias noted.  No guarding or rebound tenderness.    Neurologic:  Alert and oriented x3;  grossly normal neurologically. Psych:  Alert and cooperative. Normal mood and affect.  Imaging Studies: CT Abdomen Pelvis W Contrast  Result Date: 08/28/2019 CLINICAL DATA:  Nausea, vomiting, abdominal pain and diarrhea. EXAM: CT ABDOMEN AND PELVIS WITH CONTRAST TECHNIQUE: Multidetector CT imaging of the abdomen and pelvis was performed using the standard protocol following bolus administration of intravenous contrast. CONTRAST:  186mL OMNIPAQUE IOHEXOL 300 MG/ML  SOLN COMPARISON:  None. FINDINGS: Lower chest: No acute abnormality. Hepatobiliary: No focal liver abnormality is seen. No gallstones, gallbladder  wall thickening, or biliary dilatation.  Pancreas: Unremarkable. No pancreatic ductal dilatation or surrounding inflammatory changes. Spleen: Normal in size without focal abnormality. Adrenals/Urinary Tract: Adrenal glands are unremarkable. Kidneys are normal in size, without renal calculi or hydronephrosis. A 1.5 cm simple cyst is seen within the anterolateral aspect of the mid to upper left kidney. Bladder is unremarkable. Stomach/Bowel: Stomach is within normal limits. The appendix is not clearly identified. No evidence of bowel dilatation. Noninflamed diverticula are seen within the descending and sigmoid colon. Vascular/Lymphatic: There is moderate to marked severity calcification of the abdominal aorta and bilateral common iliac arteries. No enlarged abdominal or pelvic lymph nodes. Reproductive: Status post hysterectomy. No adnexal masses. Other: No abdominal wall hernia or abnormality. No abdominopelvic ascites. Musculoskeletal: Multilevel degenerative changes seen within the lumbar spine, most prominent at the level of L5-S1. IMPRESSION: 1. Noninflamed diverticula within the descending and sigmoid colon. 2. 1.5 cm left renal cyst. 3. Multilevel degenerative changes within the lumbar spine, most prominent at the level of L5-S1. Aortic Atherosclerosis (ICD10-I70.0). Electronically Signed   By: Virgina Norfolk M.D.   On: 08/28/2019 22:25    Assessment and Plan:   DEANNA WIATER is a 74 y.o. y/o female has been referred for abdominal pain.  Previously been seen by [facility] clinic gastroenterology and had her EGD/colonoscopy performed by Dr. Bary Castilla.  They were normal.  Recent CT scan of the abdomen showed no intra-abdominal pathology but showed degenerative  changes of the spine and the L5-S1 region.  Labs did not reveal any anemia or abnormal liver function test.  Her abdominal pain has been ongoing for many months and she does definitely have features which is suggestive of irritable bowel syndrome with constipation.  Over the last few weeks  she has been having some diarrhea.  Unclear if diarrhea is true diarrhea or  diarrhea secondary to constipation due to overflow.  Plan 1.  Check stool studies rule out infection 2.  Abdominal x-ray to rule out constipation 3.  H. pylori breath test. 4.  I will give her a call early next week to discuss the results and depending on the results we will discuss the next steps.    Follow up in 1 week telephone visit with Dr.  Dr Jonathon Bellows MD,MRCP(U.K)

## 2019-09-13 LAB — H. PYLORI BREATH TEST: H pylori Breath Test: NEGATIVE

## 2019-09-15 ENCOUNTER — Other Ambulatory Visit: Payer: Self-pay | Admitting: Family Medicine

## 2019-09-15 NOTE — Telephone Encounter (Signed)
Requested Prescriptions  Pending Prescriptions Disp Refills   rosuvastatin (CRESTOR) 20 MG tablet [Pharmacy Med Name: Rosuvastatin Calcium 20 MG Oral Tablet] 90 tablet 1    Sig: Take 1 tablet by mouth once daily     Cardiovascular:  Antilipid - Statins Failed - 09/15/2019  5:43 PM      Failed - LDL in normal range and within 360 days    LDL Cholesterol (Calc)  Date Value Ref Range Status  12/23/2016 65 mg/dL (calc) Final    Comment:    Reference range: <100 . Desirable range <100 mg/dL for primary prevention;   <70 mg/dL for patients with CHD or diabetic patients  with > or = 2 CHD risk factors. Marland Kitchen LDL-C is now calculated using the Martin-Hopkins  calculation, which is a validated novel method providing  better accuracy than the Friedewald equation in the  estimation of LDL-C.  Cresenciano Genre et al. Annamaria Helling. 8756;433(29): 2061-2068  (http://education.[employer].com/faq/FAQ164)    LDL Chol Calc (NIH)  Date Value Ref Range Status  07/16/2019 89 0 - 99 mg/dL Final         Passed - Total Cholesterol in normal range and within 360 days    Cholesterol, Total  Date Value Ref Range Status  07/16/2019 172 100 - 199 mg/dL Final         Passed - HDL in normal range and within 360 days    HDL  Date Value Ref Range Status  07/16/2019 69 >39 mg/dL Final         Passed - Triglycerides in normal range and within 360 days    Triglycerides  Date Value Ref Range Status  07/16/2019 75 0 - 149 mg/dL Final         Passed - Patient is not pregnant      Passed - Valid encounter within last 12 months    Recent Outpatient Visits          1 week ago Stomach pain   [place], [place], PA-C   1 month ago Other acute gastritis without hemorrhage   [facility] [place], [place], [place]   2 months ago Bilateral lower extremity edema   [facility], [place], [place]   4 months ago Seasonal allergic rhinitis, unspecified trigger    [facility] [place], Dionne Bucy, MD   4 months ago Venous stasis dermatitis of both lower extremities   [facility] [place], Dionne Bucy, MD      Future Appointments            In 4 days Jonathon Bellows, MD [facility]            losartan (COZAAR) 50 MG tablet [Pharmacy Med Name: Losartan Potassium 50 MG Oral Tablet] 90 tablet 1    Sig: Take 1 tablet by mouth once daily     Cardiovascular:  Angiotensin Receptor Blockers Passed - 09/15/2019  5:43 PM      Passed - Cr in normal range and within 180 days    Creat  Date Value Ref Range Status  12/23/2016 0.58 (L) 0.60 - 0.93 mg/dL Final    Comment:    For patients >46 years of age, the reference limit for Creatinine is approximately 13% higher for people identified as African-American. .    Creatinine, Ser  Date Value Ref Range Status  07/29/2019 0.69 0.44 - 1.00 mg/dL Final   Creatinine, POC  Date Value Ref Range Status  01/21/2016  n/a mg/dL Final         Passed - K in normal range and within 180 days    Potassium  Date Value Ref Range Status  07/29/2019 3.9 3.5 - 5.1 mmol/L Final         Passed - Patient is not pregnant      Passed - Last BP in normal range    BP Readings from Last 1 Encounters:  09/12/19 115/66         Passed - Valid encounter within last 6 months    Recent Outpatient Visits          1 week ago Stomach pain   [place], [place], [place]   1 month ago Other acute gastritis without hemorrhage   [facility] [place], [place], [place]   2 months ago Bilateral lower extremity edema   [place], [place], [place]   4 months ago Seasonal allergic rhinitis, unspecified trigger   [facility] [place], Dionne Bucy, MD   4 months ago Venous stasis dermatitis of both lower extremities   [facility] [place], Dionne Bucy, MD      Future Appointments            In 4 days  Jonathon Bellows, MD [place]

## 2019-09-16 ENCOUNTER — Other Ambulatory Visit: Payer: Self-pay | Admitting: Family Medicine

## 2019-09-16 DIAGNOSIS — E876 Hypokalemia: Secondary | ICD-10-CM

## 2019-09-17 ENCOUNTER — Encounter: Payer: Self-pay | Admitting: Gastroenterology

## 2019-09-18 DIAGNOSIS — R197 Diarrhea, unspecified: Secondary | ICD-10-CM | POA: Diagnosis not present

## 2019-09-19 ENCOUNTER — Encounter (INDEPENDENT_AMBULATORY_CARE_PROVIDER_SITE_OTHER): Payer: Medicare Other | Admitting: Gastroenterology

## 2019-09-19 ENCOUNTER — Telehealth: Payer: Self-pay | Admitting: Gastroenterology

## 2019-09-19 NOTE — Telephone Encounter (Signed)
Patient wants to know what date should she reschedule her appointment to and stated that she has already taking the H. Pylori test. Patient wanted to know why no one called to let her know about rescheduling.I deeply apologized to the patient for not contacting her and expressed that the [place] did try to contact her.

## 2019-09-19 NOTE — Progress Notes (Signed)
Rescheduled

## 2019-09-23 ENCOUNTER — Telehealth: Payer: Self-pay

## 2019-09-23 ENCOUNTER — Other Ambulatory Visit: Payer: Self-pay

## 2019-09-23 ENCOUNTER — Ambulatory Visit: Payer: Medicare Other | Admitting: Gastroenterology

## 2019-09-23 LAB — GI PROFILE, STOOL, PCR

## 2019-09-23 LAB — C DIFFICILE, CYTOTOXIN B

## 2019-09-23 LAB — C DIFFICILE TOXINS A+B W/RFLX: C difficile Toxins A+B, EIA: NEGATIVE

## 2019-09-23 MED ORDER — AZITHROMYCIN 500 MG PO TABS: 500.0000 mg | ORAL_TABLET | Freq: Every day | ORAL | 0 refills | Status: AC

## 2019-09-23 NOTE — Telephone Encounter (Signed)
-----   Message from Jonathon Bellows, MD sent at 09/23/2019  8:19 AM EDT ----- Samantha Henry inform    Stool positive for E coli infection . If still having diarrhea then azithromycin 500 mg once daily for 3 days .   She can have office visit rescheduled after completing antibiotics as her symptoms may resolve with it .   C/c [place], Dionne Bucy, MD   Dr Jonathon Bellows MD,MRCP [facility]) Gastroenterology/Hepatology Pager: [phone]

## 2019-09-23 NOTE — Telephone Encounter (Signed)
Pt has been notified of results and Dr. Georgeann Oppenheim recommendations. Pt states she still has some diarrhea and prefers to take the antibiotic.

## 2019-09-27 DIAGNOSIS — J449 Chronic obstructive pulmonary disease, unspecified: Secondary | ICD-10-CM | POA: Diagnosis not present

## 2019-10-09 ENCOUNTER — Telehealth: Payer: Self-pay | Admitting: *Deleted

## 2019-10-09 NOTE — Chronic Care Management (AMB) (Signed)
  Care Management   Note  10/09/2019 Name: Samantha Henry MRN: [id number] DOB: Sep 23, 1945  Samantha Henry is a 74 y.o. year old female who is a primary care patient of Brita Romp, Dionne Bucy, MD and is actively engaged with the care management team. I reached out to [name] by phone today to assist with scheduling a follow up visit with the Pharmacist.  Follow up plan: Patient declines further follow up and engagement by the care management team. Appropriate care team members and provider have been notified via electronic communication.   [place] Management

## 2019-10-14 ENCOUNTER — Telehealth: Payer: Self-pay | Admitting: Family Medicine

## 2019-10-14 NOTE — Telephone Encounter (Signed)
Pt's family member called in to request a rx for a new ONE TOUCH ULTRA 2 test meter. Family member says that pt's meter isn't working properly.     Pharmacy:  [facility] [address] [place]), [place] - [place] Phone:  [phone]  Fax:  [phone]     Please assist.

## 2019-10-15 NOTE — Telephone Encounter (Signed)
Dr. Caryn Section, would you please sign Rx for glucometer? It kept defaulting to print when I tried to send. Thanks!

## 2019-10-16 MED ORDER — ONETOUCH ULTRA 2 W/DEVICE KIT: PACK | 0 refills | Status: DC

## 2019-10-27 DIAGNOSIS — J449 Chronic obstructive pulmonary disease, unspecified: Secondary | ICD-10-CM | POA: Diagnosis not present

## 2019-11-11 ENCOUNTER — Other Ambulatory Visit: Payer: Self-pay | Admitting: Family Medicine

## 2019-11-11 MED ORDER — TRELEGY ELLIPTA 100-62.5-25 MCG/INH IN AEPB: INHALATION_SPRAY | RESPIRATORY_TRACT | 3 refills | Status: DC

## 2019-11-11 NOTE — Telephone Encounter (Signed)
Medication Refill - Medication: TRELEGY ELLIPTA 100-62.5-25 MCG/INH AEPB (Pharmacy has tried multiple times to get medication sent back over.)   Has the patient contacted their pharmacy? yes (Agent: If no, request that the patient contact the pharmacy for the refill.) (Agent: If yes, when and what did the pharmacy advise?)Contact PCP  Preferred Pharmacy (with phone number or street name):  [facility]), [place] Phone:  [phone]  Fax:  [phone]       Agent: Please be advised that RX refills may take up to 3 business days. We ask that you follow-up with your pharmacy.

## 2019-11-11 NOTE — Telephone Encounter (Signed)
Requested medication (s) are due for refill today: yes  Requested medication (s) are on the active medication list: yes  Last refill:  08/27/19  60  3 refills  Future visit scheduled:no  Notes to clinic:  Spoke with pharmacy, they have 30 day supply for her.  They ask that if you would reorder for 90 day It will be cheaper for patient.  Medication is off protocol.    Requested Prescriptions  Pending Prescriptions Disp Refills   Fluticasone-Umeclidin-Vilant (TRELEGY ELLIPTA) 100-62.5-25 MCG/INH AEPB 60 each 3    Sig: INHALE 1 PUFF ONCE DAILY      Off-Protocol Failed - 11/11/2019  4:14 PM      Failed - Medication not assigned to a protocol, review manually.      Passed - Valid encounter within last 12 months    Recent Outpatient Visits           2 months ago Stomach pain   [place], [place]   3 months ago Other acute gastritis without hemorrhage   [place], [place]   3 months ago Bilateral lower extremity edema   [place], [place]   6 months ago Seasonal allergic rhinitis, unspecified trigger   [facility] [place], Dionne Bucy, MD   6 months ago Venous stasis dermatitis of both lower extremities   [facility], Dionne Bucy, MD

## 2019-11-27 DIAGNOSIS — J449 Chronic obstructive pulmonary disease, unspecified: Secondary | ICD-10-CM | POA: Diagnosis not present

## 2019-11-28 DIAGNOSIS — Z9981 Dependence on supplemental oxygen: Secondary | ICD-10-CM | POA: Diagnosis not present

## 2019-11-28 DIAGNOSIS — C3402 Malignant neoplasm of left main bronchus: Secondary | ICD-10-CM | POA: Diagnosis not present

## 2019-11-28 DIAGNOSIS — Z23 Encounter for immunization: Secondary | ICD-10-CM | POA: Diagnosis not present

## 2019-11-28 DIAGNOSIS — J449 Chronic obstructive pulmonary disease, unspecified: Secondary | ICD-10-CM | POA: Diagnosis not present

## 2019-11-28 DIAGNOSIS — R0600 Dyspnea, unspecified: Secondary | ICD-10-CM | POA: Diagnosis not present

## 2019-12-01 ENCOUNTER — Other Ambulatory Visit: Payer: Self-pay | Admitting: Physician Assistant

## 2019-12-01 DIAGNOSIS — K2900 Acute gastritis without bleeding: Secondary | ICD-10-CM

## 2019-12-01 NOTE — Telephone Encounter (Signed)
Requested Prescriptions  Pending Prescriptions Disp Refills  . pantoprazole (PROTONIX) 40 MG tablet [Pharmacy Med Name: Pantoprazole Sodium 40 MG Oral Tablet Delayed Release] 90 tablet 0    Sig: Take 1 tablet by mouth once daily     Gastroenterology: Proton Pump Inhibitors Passed - 12/01/2019  5:29 PM      Passed - Valid encounter within last 12 months    Recent Outpatient Visits          2 months ago Stomach pain   [place], [place]   3 months ago Other acute gastritis without hemorrhage   [place], [place]   4 months ago Bilateral lower extremity edema   [place], [place]   6 months ago Seasonal allergic rhinitis, unspecified trigger   [facility] [place], Dionne Bucy, MD   7 months ago Venous stasis dermatitis of both lower extremities   [facility], Dionne Bucy, MD

## 2019-12-11 ENCOUNTER — Other Ambulatory Visit: Payer: Self-pay | Admitting: Family Medicine

## 2019-12-11 ENCOUNTER — Telehealth: Payer: Self-pay | Admitting: Family Medicine

## 2019-12-11 NOTE — Telephone Encounter (Signed)
Copied from [place] [id number]. Topic: Medicare AWV >> Dec 11, 2019  3:06 PM Cher Nakai R wrote: Reason for CRM:   Left message for patient to call back and schedule Medicare Annual Wellness Visit (AWV) in office.   If not able to come in office, please offer to do virtually.   Last AWV 11/22/2018  Please schedule at anytime with [facility] Health Advisor.  If any questions, please contact me at [phone]

## 2019-12-26 DIAGNOSIS — Z9981 Dependence on supplemental oxygen: Secondary | ICD-10-CM | POA: Diagnosis not present

## 2019-12-26 DIAGNOSIS — I671 Cerebral aneurysm, nonruptured: Secondary | ICD-10-CM | POA: Diagnosis not present

## 2019-12-27 DIAGNOSIS — J449 Chronic obstructive pulmonary disease, unspecified: Secondary | ICD-10-CM | POA: Diagnosis not present

## 2020-01-05 ENCOUNTER — Other Ambulatory Visit: Payer: Self-pay | Admitting: Family Medicine

## 2020-01-05 NOTE — Telephone Encounter (Signed)
Requested medication (s) are due for refill today: no  Requested medication (s) are on the active medication list: yes  Last refill:  08/04/2019  Future visit scheduled: no  Notes to clinic:  This refill cannot be delegated   Requested Prescriptions  Pending Prescriptions Disp Refills   Baclofen 5 MG TABS [Pharmacy Med Name: Baclofen 5 MG Oral Tablet] 90 tablet 0    Sig: Take 1 tablet by mouth three times daily as needed      Not Delegated - Analgesics:  Muscle Relaxants Failed - 01/05/2020  5:04 PM      Failed - This refill cannot be delegated      Passed - Valid encounter within last 6 months    Recent Outpatient Visits           4 months ago Stomach pain   [place], [place], [place]   4 months ago Other acute gastritis without hemorrhage   [place], [place]   5 months ago Bilateral lower extremity edema   [place], PA-C   8 months ago Seasonal allergic rhinitis, unspecified trigger   [facility] [place], Dionne Bucy, MD   8 months ago Venous stasis dermatitis of both lower extremities   [facility], Dionne Bucy, MD

## 2020-01-14 ENCOUNTER — Other Ambulatory Visit: Payer: Self-pay | Admitting: *Deleted

## 2020-01-15 ENCOUNTER — Ambulatory Visit: Admission: RE | Admit: 2020-01-15 | Payer: Medicare Other | Source: Ambulatory Visit

## 2020-01-27 DIAGNOSIS — J449 Chronic obstructive pulmonary disease, unspecified: Secondary | ICD-10-CM | POA: Diagnosis not present

## 2020-01-30 ENCOUNTER — Ambulatory Visit: Payer: Medicare Other | Admitting: Radiation Oncology

## 2020-01-31 DIAGNOSIS — J449 Chronic obstructive pulmonary disease, unspecified: Secondary | ICD-10-CM | POA: Diagnosis not present

## 2020-01-31 DIAGNOSIS — I671 Cerebral aneurysm, nonruptured: Secondary | ICD-10-CM | POA: Diagnosis not present

## 2020-02-07 ENCOUNTER — Telehealth: Payer: Self-pay | Admitting: Family Medicine

## 2020-02-07 NOTE — Telephone Encounter (Signed)
Copied from [place] [id number]. Topic: Medicare AWV >> Feb 07, 2020  1:32 PM Cher Nakai R wrote: Reason for CRM:  Left message for patient to call back and schedule Medicare Annual Wellness Visit (AWV) in office.   If not able to come in office, please offer to do virtually or by telephone.   Last AWV 11/22/2018  Please schedule at anytime with [facility] Health Advisor.  If any questions, please contact me at [phone]

## 2020-02-19 NOTE — Progress Notes (Signed)
Subjective:   Samantha Henry is a 75 y.o. female who presents for Medicare Annual (Subsequent) preventive examination.  I connected with Garvin Fila today by telephone and verified that I am speaking with the correct person using two identifiers. Location patient: home Location provider: work Persons participating in the virtual visit: patient, provider.   I discussed the limitations, risks, security and privacy concerns of performing an evaluation and management service by telephone and the availability of in person appointments. I also discussed with the patient that there may be a patient responsible charge related to this service. The patient expressed understanding and verbally consented to this telephonic visit.    Interactive audio and video telecommunications were attempted between this provider and patient, however failed, due to patient having technical difficulties OR patient did not have access to video capability.  We continued and completed visit with audio only.   Review of Systems    N/A  Cardiac Risk Factors include: advanced age (>57men, >65 women);diabetes mellitus;hypertension;dyslipidemia;obesity (BMI >30kg/m2)     Objective:    There were no vitals filed for this visit. There is no height or weight on file to calculate BMI.  Advanced Directives 02/20/2020 08/01/2019 07/29/2019 03/22/2019 01/30/2019 11/22/2018 08/13/2018  Does Patient Have a Medical Advance Directive? Yes Yes No Yes No Yes No  Type of Paramedic of [place];Living will [place];Living will -  Does patient want to make changes to medical advance directive? - No - Patient declined - - - - -  Copy of [place] in Chart? No - copy requested Yes - validated most recent copy scanned in chart (See row information) - - - Yes - validated most recent copy scanned in chart (See row information) -  Would  patient like information on creating a medical advance directive? - - - - No - Patient declined - No - Patient declined    Current Medications (verified) Outpatient Encounter Medications as of 02/20/2020  Medication Sig  . acetaminophen (TYLENOL) 650 MG CR tablet Take 500 mg by mouth every 8 (eight) hours as needed for pain.   Marland Kitchen albuterol (VENTOLIN HFA) 108 (90 Base) MCG/ACT inhaler Inhale 2 puffs into the lungs every 6 (six) hours as needed for wheezing or shortness of breath.  . Blood Glucose Monitoring Suppl (ONE TOUCH ULTRA 2) w/Device KIT Use daily to check blood sugar for type 2 diabetes E11.9  . calcium carbonate (OS-CAL) 600 MG TABS tablet Take 500 mg by mouth 2 (two) times daily with a meal.   . cholecalciferol (VITAMIN D) 400 units TABS tablet Take 400 Units by mouth daily.  Marland Kitchen donepezil (ARICEPT) 10 MG tablet Take 1 tablet (10 mg total) by mouth at bedtime.  . Fluticasone-Umeclidin-Vilant (TRELEGY ELLIPTA) 100-62.5-25 MCG/INH AEPB INHALE 1 PUFF ONCE DAILY  . levothyroxine (SYNTHROID) 125 MCG tablet TAKE 1 TABLET BY MOUTH ONCE DAILY BEFORE BREAKFAST  . losartan (COZAAR) 50 MG tablet Take 1 tablet by mouth once daily  . montelukast (SINGULAIR) 10 MG tablet Take 10 mg by mouth at bedtime.  . MULTIPLE VITAMIN PO Take 1 tablet by mouth daily.  Glory Rosebush ULTRA test strip USE TEST STRIP(S) TO CHECK BLOOD SUGAR DAILY FOR TYPE 2 DIABETES  . OXYGEN Inhale 3 L into the lungs. Used at night and PRN during the day  . potassium chloride SA (KLOR-CON) 20 MEQ tablet Take 1 tablet by mouth once daily  .  RABEprazole (ACIPHEX) 20 MG tablet Take 20 mg by mouth daily.  . rosuvastatin (CRESTOR) 20 MG tablet Take 1 tablet by mouth once daily  . albuterol (ACCUNEB) 0.63 MG/3ML nebulizer solution Inhale into the lungs. Take 3 mLs (0.63 mg total) by nebulization every 6 (six) hours as needed for Wheezing  . Baclofen 5 MG TABS Take 1 tablet by mouth three times daily as needed (Patient not taking: Reported on  02/20/2020)  . calcium carbonate (TUMS EX) 750 MG chewable tablet Chew 2 tablets by mouth 2 (two) times daily as needed for heartburn. (Patient not taking: Reported on 02/20/2020)  . diclofenac Sodium (VOLTAREN) 1 % GEL Voltaren 1 % topical gel  Apply to ankle twice daily. (Patient not taking: Reported on 02/20/2020)  . fluticasone (FLONASE) 50 MCG/ACT nasal spray Place 2 sprays into both nostrils daily. (Patient not taking: Reported on 02/20/2020)  . furosemide (LASIX) 20 MG tablet Take 40 mg by mouth daily at 2 PM.  (Patient not taking: Reported on 02/20/2020)  . metFORMIN (GLUCOPHAGE-XR) 500 MG 24 hr tablet TAKE 2 TABLETS BY MOUTH IN THE MORNING AND 2 TABLETS IN THE EVENING (Patient not taking: Reported on 02/20/2020)  . Omega-3 Fatty Acids (FISH OIL BURP-LESS) 1000 MG CAPS Take 1 tablet by mouth 2 (two) times daily.  (Patient not taking: Reported on 02/20/2020)  . pantoprazole (PROTONIX) 40 MG tablet Take 1 tablet by mouth once daily (Patient not taking: Reported on 02/20/2020)  . trolamine salicylate (ASPERCREME) 10 % cream Apply 1 application topically as needed for muscle pain.  (Patient not taking: Reported on 02/20/2020)  . vitamin B-12 (CYANOCOBALAMIN) 50 MCG tablet Take 50 mcg by mouth daily. (Patient not taking: Reported on 02/20/2020)   No facility-administered encounter medications on file as of 02/20/2020.    Allergies (verified) Aspirin, Gabapentin, Lyrica [pregabalin], and Tetracycline   History: Past Medical History:  Diagnosis Date  . Arthritis    osteo  . Cancer (HCC) 1969   CERVICAL CA  . COPD (chronic obstructive pulmonary disease) (HCC)   . Diabetes mellitus without complication (HCC)   . Diabetic peripheral neuropathy (HCC)   . Environmental and seasonal allergies   . GERD (gastroesophageal reflux disease)   . History of cervical cancer   . History of colon polyps   . Hypercholesteremia   . Hypertension   . Hypothyroidism   . Nail dystrophy   . Neuropathy   .  Proteinuria   . Restless legs syndrome   . Sleep apnea   . TIA (transient ischemic attack)   . Von Willebrand's disease [facility]    Past Surgical History:  Procedure Laterality Date  . ABDOMINAL HYSTERECTOMY  1969   due to cervical cancer   . APPENDECTOMY    . BLADDER SURGERY  1997   bladder tacking  . BREAST BIOPSY Left 01/21/2015   FIBROADENOMATOUS CHANGE WITH ASSOCIATED MICROCALCIFICATIONS  . CARDIAC CATHETERIZATION    . CARPAL TUNNEL RELEASE  2010  . colonoscopy and upper endoscopy'    . COLONOSCOPY WITH PROPOFOL N/A 02/25/2016   Procedure: COLONOSCOPY WITH PROPOFOL;  Surgeon: Christena Deem, MD;  Location: [facility] ENDOSCOPY;  Service: Endoscopy;  Laterality: N/A;  . COLONOSCOPY WITH PROPOFOL N/A 03/22/2019   Procedure: COLONOSCOPY WITH PROPOFOL;  Surgeon: Earline Mayotte, MD;  Location: [facility] ENDOSCOPY;  Service: Endoscopy;  Laterality: N/A;  . ESOPHAGOGASTRODUODENOSCOPY (EGD) WITH PROPOFOL N/A 03/22/2019   Procedure: ESOPHAGOGASTRODUODENOSCOPY (EGD) WITH PROPOFOL;  Surgeon: Earline Mayotte, MD;  Location: [facility] ENDOSCOPY;  Service:  Endoscopy;  Laterality: N/A;  . JOINT REPLACEMENT Left 06/14/2016   KNEE  . MUSCLE BIOPSY    . OOPHORECTOMY    . TENDON RELEASE  2010   right foot, Dr. Vickki Muff   . TOTAL KNEE ARTHROPLASTY Left 06/14/2016   Procedure: TOTAL KNEE ARTHROPLASTY;  Surgeon: Corky Mull, MD;  Location: [facility] ORS;  Service: Orthopedics;  Laterality: Left;   Family History  Problem Relation Age of Onset  . Alzheimer's disease Mother   . Cancer Father   . Diabetes Sister   . Colon cancer Sister   . Diabetes Brother   . Cancer Sister 51       melanoma  . Breast cancer Maternal Aunt   . Hodgkin's lymphoma Sister    Social History   Socioeconomic History  . Marital status: Divorced    Spouse name: Not on file  . Number of children: 3  . Years of education: Not on file  . Highest education level: 8th grade  Occupational History  . Occupation: retired  Tobacco Use   . Smoking status: Former Smoker    Packs/day: 1.50    Years: 48.00    Pack years: 72.00    Types: Cigarettes    Quit date: 01/11/2007    Years since quitting: 13.1  . Smokeless tobacco: Never Used  . Tobacco comment: smoked up to 2 ppd since 75 yo. quit in 2009  Vaping Use  . Vaping Use: Never used  Substance and Sexual Activity  . Alcohol use: No    Alcohol/week: 0.0 standard drinks  . Drug use: No  . Sexual activity: Not on file  Other Topics Concern  . Not on file  Social History Narrative  . Not on file   Social Determinants of Health   Financial Resource Strain: Low Risk   . Difficulty of Paying Living Expenses: Not hard at all  Food Insecurity: No Food Insecurity  . Worried About Charity fundraiser in the Last Year: Never true  . Ran Out of Food in the Last Year: Never true  Transportation Needs: No Transportation Needs  . Lack of Transportation (Medical): No  . Lack of Transportation (Non-Medical): No  Physical Activity: Inactive  . Days of Exercise per Week: 0 days  . Minutes of Exercise per Session: 0 min  Stress: No Stress Concern Present  . Feeling of Stress : Not at all  Social Connections: Socially Isolated  . Frequency of Communication with Friends and Family: More than three times a week  . Frequency of Social Gatherings with Friends and Family: More than three times a week  . Attends Religious Services: Never  . Active Member of Clubs or Organizations: No  . Attends Archivist Meetings: Never  . Marital Status: Divorced    Tobacco Counseling Counseling given: Not Answered Comment: smoked up to 2 ppd since 75 yo. quit in 2009   Clinical Intake:  Pre-visit preparation completed: Yes  Pain : No/denies pain     Nutritional Risks: None Diabetes: Yes  How often do you need to have someone help you when you read instructions, pamphlets, or other written materials from your doctor or pharmacy?: 1 - Never  Diabetic? Yes  Nutrition  Risk Assessment:  Has the patient had any N/V/D within the last 2 months?  No  Does the patient have any non-healing wounds?  No  Has the patient had any unintentional weight loss or weight gain?  No   Diabetes:  Is the  patient diabetic?  Yes  If diabetic, was a CBG obtained today?  No  Did the patient bring in their glucometer from home?  No  How often do you monitor your CBG's? Once a day.   Financial Strains and Diabetes Management:  Are you having any financial strains with the device, your supplies or your medication? No .  Does the patient want to be seen by Chronic Care Management for management of their diabetes?  No  Would the patient like to be referred to a Nutritionist or for Diabetic Management?  No   Diabetic Exams:  Diabetic Eye Exam: Overdue for diabetic eye exam. Pt has been advised about the importance in completing this exam. Patient advised to call and schedule an eye exam. Diabetic Foot Exam: Overdue, Pt has been advised about the importance in completing this exam.    Interpreter Needed?: No  Information entered by :: [facility], LPN   Activities of Daily Living In your present state of health, do you have any difficulty performing the following activities: 02/20/2020  Hearing? N  Vision? N  Difficulty concentrating or making decisions? Y  Comment Currently on Aricept.  Walking or climbing stairs? Y  Comment Avoids steps.  Dressing or bathing? N  Doing errands, shopping? Y  Comment Not driving currently.  Preparing Food and eating ? N  Using the Toilet? N  In the past six months, have you accidently leaked urine? N  Do you have problems with loss of bowel control? N  Managing your Medications? Y  Comment Daughter manages medications.  Managing your Finances? Y  Comment Daughter manages finances.  Housekeeping or managing your Housekeeping? N  Some recent data might be hidden    Patient Care Team: Mar Daring, PA-C as PCP - General  (Family Medicine) Erby Pian, MD as Consulting Physician (Pulmonary Disease) Anell Barr, OD (Optometry) Luther Hearing Purvis Sheffield, MD as Referring Physician (Neurosurgery)  Indicate any recent Medical Services you may have received from other than [facility] providers in the past year (date may be approximate).     Assessment:   This is a routine wellness examination for Aren.  Hearing/Vision screen No exam data present  Dietary issues and exercise activities discussed: Current Exercise Habits: The patient does not participate in regular exercise at present, Exercise limited by: None identified  Goals    . Exercise 3x per week (30 min per time)     Recommend to start walking 3 days a week for at least 30 minutes at a time.     . Increase water intake     Starting 12/31/15, I will continue to drink 6-8 glasses of water a day.    . Increase water intake     Recommend cutting back on the amount of coffee and substituting with water to drink at least 4-6 glasses a day.   11/16/17- Continue trying to cut back on coffee and increase water intake to 6-8 glasses a day.     . Patient Stated     CARE PLAN ENTRY  Current Barriers:  . Chronic Disease Management support, education, and care coordination needs related to Hyperlipidemia and COPD   Hyperlipidemia . Pharmacist Clinical Goal(s): o Over the next 90 days, patient will work with PharmD and providers to maintain LDL goal < 70 . Current regimen:  o Crestor $RemoveB'20mg'qpYkGabd$  daily . Interventions: o None . Patient self care activities - Over the next 90 days, patient will: o Continue current lifestyle modifications to  support statin treatment  COPD . Pharmacist Clinical Goal(s) o Over the next 30 days, patient will work with PharmD and providers to end Afrin physical dependence o Achieve breathing control using daily maintenance inhalers such as Trelegy . Current regimen:  o Trelegy daily, albuterol nebulizer twice daily, rescue inhaler at  least daily . Interventions: o Stop all Afrin (oxymetazoline) related nasal sprays o Try to reduce rescue inhaler use if possible, you may be experiencing rebound reactive airway disease from overuse of albuterol . Patient self care activities - Over the next 30 days, patient will: o Replace Afrin with [place] and Flonase (fluticasone nasal spray)  Medication management . Pharmacist Clinical Goal(s): o Over the next 90 days, patient will work with PharmD and providers to achieve optimal medication adherence . Current pharmacy: [facility] . Interventions o Comprehensive medication review performed. o Continue current medication management strategy . Patient self care activities - Over the next 90 days, patient will: o Focus on medication adherence by using nasal sprays as directed o Take medications as prescribed o Report any questions or concerns to PharmD and/or provider(s)  Initial goal documentation       Depression Screen PHQ 2/9 Scores 02/20/2020 11/22/2018 11/16/2017 11/15/2016 12/31/2015 12/26/2014  PHQ - 2 Score 0 2 5 0 0 0  PHQ- 9 Score - 2 8 - - -    Fall Risk Fall Risk  02/20/2020 11/22/2018 08/17/2018 11/16/2017 11/15/2016  Falls in the past year? 0 0 0 0 Yes  Number falls in past yr: 0 0 - - 1  Injury with Fall? 0 0 - - No  Follow up - - - - Falls prevention discussed    FALL RISK PREVENTION PERTAINING TO THE HOME:  Any stairs in or around the home? No  If so, are there any without handrails? No  Home free of loose throw rugs in walkways, pet beds, electrical cords, etc? Yes  Adequate lighting in your home to reduce risk of falls? Yes   ASSISTIVE DEVICES UTILIZED TO PREVENT FALLS:  Life alert? No  Use of a cane, walker or w/c? No  Grab bars in the bathroom? No  Shower chair or bench in shower? No  Elevated toilet seat or a handicapped toilet? No    Cognitive Function:     6CIT Screen 11/22/2018 11/16/2017 12/31/2015  What Year? 0 points 0 points 0 points  What  month? 0 points 0 points 0 points  What time? 3 points 0 points 0 points  Count back from 20 0 points 0 points 0 points  Months in reverse 0 points 4 points 2 points  Repeat phrase 6 points 4 points 4 points  Total Score _0 Immunizations Immunization History  Administered Date(s) Administered  . Influenza, High Dose Seasonal PF 10/19/2015, 09/15/2016, 09/18/2017, 10/06/2018  . Influenza,inj,Quad PF,6+ Mos 11/28/2019  . Influenza-Unspecified 02/02/2015  . PFIZER(Purple Top)SARS-COV-2 Vaccination 02/27/2019, 03/21/2019, 11/29/2019  . Pneumococcal Conjugate-13 04/12/2013, 09/18/2017  . Pneumococcal Polysaccharide-23 11/24/2011  . Tdap 06/06/2008  . Zoster 09/11/2010  . Zoster Recombinat (Shingrix) 12/13/2017, 07/10/2018    TDAP status: Due, Education has been provided regarding the importance of this vaccine. Advised may receive this vaccine at local pharmacy or Health Dept. Aware to provide a copy of the vaccination record if obtained from local pharmacy or Health Dept. Verbalized acceptance and understanding.  Flu Vaccine status: Up to date  Pneumococcal vaccine status: Up to date  Covid-19 vaccine status: Completed vaccines  Qualifies for  Shingles Vaccine? Yes   Zostavax completed Yes   Shingrix Completed?: Yes  Screening Tests Health Maintenance  Topic Date Due  . FOOT EXAM  Never done  . OPHTHALMOLOGY EXAM  03/31/2018  . TETANUS/TDAP  06/07/2018  . HEMOGLOBIN A1C  01/16/2020  . DEXA SCAN  02/24/2020  . COVID-19 Vaccine (4 - Booster for Pfizer series) 05/28/2020  . MAMMOGRAM  01/21/2021  . COLONOSCOPY (Pts 45-41yr Insurance coverage will need to be confirmed)  03/21/2024  . INFLUENZA VACCINE  Completed  . Hepatitis C Screening  Completed  . PNA vac Low Risk Adult  Completed    Health Maintenance  Health Maintenance Due  Topic Date Due  . FOOT EXAM  Never done  . OPHTHALMOLOGY EXAM  03/31/2018  . TETANUS/TDAP  06/07/2018  . HEMOGLOBIN A1C  01/16/2020     Colorectal cancer screening: Type of screening: Colonoscopy. Completed 03/22/19. Repeat every 5 years  Mammogram status: Completed 01/22/19. Repeat every year  Bone Density status: Currently due, [place].  Lung Cancer Screening: (Low Dose CT Chest recommended if Age 627-80years, 30 pack-year currently smoking OR have quit w/in 15years.) does qualify however order is on file and [place]   Additional Screening:  Hepatitis C Screening: Up to date  Vision Screening: Recommended annual ophthalmology exams for early detection of glaucoma and other disorders of the eye. Is the patient up to date with their annual eye exam?  Yes  Who is the provider or what is the name of the office in which the patient attends annual eye exams? Dr WEllin MayhewIf pt is not established with a provider, would they like to be referred to a provider to establish care? No .   Dental Screening: Recommended annual dental exams for proper oral hygiene  Community Resource Referral / Chronic Care Management: CRR required this visit?  No   CCM required this visit?  No      Plan:     I have personally reviewed and noted the following in the patient's chart:   . Medical and social history . Use of alcohol, tobacco or illicit drugs  . Current medications and supplements . Functional ability and status . Nutritional status . Physical activity . Advanced directives . List of other physicians . Hospitalizations, surgeries, and ER visits in previous 12 months . Vitals . Screenings to include cognitive, depression, and falls . Referrals and appointments  In addition, I have reviewed and discussed with patient certain preventive protocols, quality metrics, and best practice recommendations. A written personalized care plan for preventive services as well as general preventive health recommendations were provided to  patient.     Megon Kalina MCanal [place]  [id number]/15/[id number]  Nurse Notes: Pt needs a diabetic foot exam and A1c check at next in office apt. Daughter plans to set pt up for an eye exam. [place] She will let [place] if [place] does.

## 2020-02-20 ENCOUNTER — Ambulatory Visit (INDEPENDENT_AMBULATORY_CARE_PROVIDER_SITE_OTHER): Payer: Medicare Other

## 2020-02-20 ENCOUNTER — Other Ambulatory Visit: Payer: Self-pay

## 2020-02-20 DIAGNOSIS — Z0000 Encounter for general adult medical examination without abnormal findings: Secondary | ICD-10-CM

## 2020-02-20 NOTE — Patient Instructions (Signed)
[name] , Thank you for taking time to come for your Medicare Wellness Visit. I appreciate your ongoing commitment to your health goals. Please review the following plan we discussed and let me know if I can assist you in the future.   Screening recommendations/referrals: Colonoscopy: Up to date, due 03/2024 Mammogram: Currently due, declined order. Daughter to schedule with physician in [place].  Bone Density: Currently due, declined order. Daughter to schedule with physician in [place].  Recommended yearly ophthalmology/optometry visit for glaucoma screening and checkup Recommended yearly dental visit for hygiene and checkup  Vaccinations: Influenza vaccine: Done 11/27/20 Pneumococcal vaccine: Completed series Tdap vaccine: Currently due, declined receiving. Shingles vaccine: Completed series    Advanced directives: Currently on file.   Conditions/risks identified: Recommend to increase water intake to 6-8 8 oz glasses a day and start exercising 3 days a week for 30 minutes at a time.   Next appointment: None, declined scheduling a follow up with PCP or an AWV for 2023 at this time.    Preventive Care 16 Years and Older, Female Preventive care refers to lifestyle choices and visits with your health care provider that can promote health and wellness. What does preventive care include?  A yearly physical exam. This is also called an annual well check.  Dental exams once or twice a year.  Routine eye exams. Ask your health care provider how often you should have your eyes checked.  Personal lifestyle choices, including:  Daily care of your teeth and gums.  Regular physical activity.  Eating a healthy diet.  Avoiding tobacco and drug use.  Limiting alcohol use.  Practicing safe sex.  Taking low-dose aspirin every day.  Taking vitamin and mineral supplements as recommended by your health care provider. What happens during an annual well check? The services and screenings  done by your health care provider during your annual well check will depend on your age, overall health, lifestyle risk factors, and family history of disease. Counseling  Your health care provider may ask you questions about your:  Alcohol use.  Tobacco use.  Drug use.  Emotional well-being.  Home and relationship well-being.  Sexual activity.  Eating habits.  History of falls.  Memory and ability to understand (cognition).  Work and work Statistician.  Reproductive health. Screening  You may have the following tests or measurements:  Height, weight, and BMI.  Blood pressure.  Lipid and cholesterol levels. These may be checked every 5 years, or more frequently if you are over 64 years old.  Skin check.  Lung cancer screening. You may have this screening every year starting at age 60 if you have a 30-pack-year history of smoking and currently smoke or have quit within the past 15 years.  Fecal occult blood test (FOBT) of the stool. You may have this test every year starting at age 29.  Flexible sigmoidoscopy or colonoscopy. You may have a sigmoidoscopy every 5 years or a colonoscopy every 10 years starting at age 11.  Hepatitis C blood test.  Hepatitis B blood test.  Sexually transmitted disease (STD) testing.  Diabetes screening. This is done by checking your blood sugar (glucose) after you have not eaten for a while (fasting). You may have this done every 1-3 years.  Bone density scan. This is done to screen for osteoporosis. You may have this done starting at age 79.  Mammogram. This may be done every 1-2 years. Talk to your health care provider about how often you should have regular  mammograms. Talk with your health care provider about your test results, treatment options, and if necessary, the need for more tests. Vaccines  Your health care provider may recommend certain vaccines, such as:  Influenza vaccine. This is recommended every year.  Tetanus,  diphtheria, and acellular pertussis (Tdap, Td) vaccine. You may need a Td booster every 10 years.  Zoster vaccine. You may need this after age 27.  Pneumococcal 13-valent conjugate (PCV13) vaccine. One dose is recommended after age 30.  Pneumococcal polysaccharide (PPSV23) vaccine. One dose is recommended after age 103. Talk to your health care provider about which screenings and vaccines you need and how often you need them. This information is not intended to replace advice given to you by your health care provider. Make sure you discuss any questions you have with your health care provider. Document Released: 01/23/2015 Document Revised: 09/16/2015 Document Reviewed: 10/28/2014 Elsevier Interactive Patient Education  2017 [place] Prevention in the Home Falls can cause injuries. They can happen to people of all ages. There are many things you can do to make your home safe and to help prevent falls. What can I do on the outside of my home?  Regularly fix the edges of walkways and driveways and fix any cracks.  Remove anything that might make you trip as you walk through a door, such as a raised step or threshold.  Trim any bushes or trees on the path to your home.  Use bright outdoor lighting.  Clear any walking paths of anything that might make someone trip, such as rocks or tools.  Regularly check to see if handrails are loose or broken. Make sure that both sides of any steps have handrails.  Any raised decks and porches should have guardrails on the edges.  Have any leaves, snow, or ice cleared regularly.  Use sand or salt on walking paths during winter.  Clean up any spills in your garage right away. This includes oil or grease spills. What can I do in the bathroom?  Use night lights.  Install grab bars by the toilet and in the tub and shower. Do not use towel bars as grab bars.  Use non-skid mats or decals in the tub or shower.  If you need to sit down in  the shower, use a plastic, non-slip stool.  Keep the floor dry. Clean up any water that spills on the floor as soon as it happens.  Remove soap buildup in the tub or shower regularly.  Attach bath mats securely with double-sided non-slip rug tape.  Do not have throw rugs and other things on the floor that can make you trip. What can I do in the bedroom?  Use night lights.  Make sure that you have a light by your bed that is easy to reach.  Do not use any sheets or blankets that are too big for your bed. They should not hang down onto the floor.  Have a firm chair that has side arms. You can use this for support while you get dressed.  Do not have throw rugs and other things on the floor that can make you trip. What can I do in the kitchen?  Clean up any spills right away.  Avoid walking on wet floors.  Keep items that you use a lot in easy-to-reach places.  If you need to reach something above you, use a strong step stool that has a grab bar.  Keep electrical cords out of the way.  Do not use floor polish or wax that makes floors slippery. If you must use wax, use non-skid floor wax.  Do not have throw rugs and other things on the floor that can make you trip. What can I do with my stairs?  Do not leave any items on the stairs.  Make sure that there are handrails on both sides of the stairs and use them. Fix handrails that are broken or loose. Make sure that handrails are as long as the stairways.  Check any carpeting to make sure that it is firmly attached to the stairs. Fix any carpet that is loose or worn.  Avoid having throw rugs at the top or bottom of the stairs. If you do have throw rugs, attach them to the floor with carpet tape.  Make sure that you have a light switch at the top of the stairs and the bottom of the stairs. If you do not have them, ask someone to add them for you. What else can I do to help prevent falls?  Wear shoes that:  Do not have high  heels.  Have rubber bottoms.  Are comfortable and fit you well.  Are closed at the toe. Do not wear sandals.  If you use a stepladder:  Make sure that it is fully opened. Do not climb a closed stepladder.  Make sure that both sides of the stepladder are locked into place.  Ask someone to hold it for you, if possible.  Clearly mark and make sure that you can see:  Any grab bars or handrails.  First and last steps.  Where the edge of each step is.  Use tools that help you move around (mobility aids) if they are needed. These include:  Canes.  Walkers.  Scooters.  Crutches.  Turn on the lights when you go into a dark area. Replace any light bulbs as soon as they burn out.  Set up your furniture so you have a clear path. Avoid moving your furniture around.  If any of your floors are uneven, fix them.  If there are any pets around you, be aware of where they are.  Review your medicines with your doctor. Some medicines can make you feel dizzy. This can increase your chance of falling. Ask your doctor what other things that you can do to help prevent falls. This information is not intended to replace advice given to you by your health care provider. Make sure you discuss any questions you have with your health care provider. Document Released: 10/23/2008 Document Revised: 06/04/2015 Document Reviewed: 01/31/2014 Elsevier Interactive Patient Education  2017 [employer].

## 2020-02-27 DIAGNOSIS — J449 Chronic obstructive pulmonary disease, unspecified: Secondary | ICD-10-CM | POA: Diagnosis not present

## 2020-03-05 ENCOUNTER — Telehealth: Payer: Self-pay | Admitting: Physician Assistant

## 2020-03-05 DIAGNOSIS — R413 Other amnesia: Secondary | ICD-10-CM

## 2020-03-05 NOTE — Telephone Encounter (Signed)
Medication Refill - Medication: donepezil (ARICEPT) 10 MG tablet Also needs more test strips for her One Touch Ultra 2 device   She is running low, and will be out by the next   Has the patient contacted their pharmacy? Yes.   (Agent: If no, request that the patient contact the pharmacy for the refill.) (Agent: If yes, when and what did the pharmacy advise?)  Preferred Pharmacy (with phone number or street name):  [facility] [address] [place]  [phone] [phone] [place] [address]  Phone: [phone] Fax: [phone]     Agent: Please be advised that RX refills may take up to 3 business days. We ask that you follow-up with your pharmacy.

## 2020-03-06 MED ORDER — ONETOUCH ULTRA 2 W/DEVICE KIT: PACK | 0 refills | Status: DC

## 2020-03-06 MED ORDER — DONEPEZIL HCL 10 MG PO TABS: 10.0000 mg | ORAL_TABLET | Freq: Every day | ORAL | 5 refills | Status: DC

## 2020-03-13 NOTE — Telephone Encounter (Signed)
Please review. Ok to refill? Jenni pt.

## 2020-03-13 NOTE — Telephone Encounter (Signed)
Yes OK to fill to their preferred pharmacy and send in strips or other testing supplies.

## 2020-03-13 NOTE — Telephone Encounter (Signed)
Pts daughter called back stating the Pt is very low on her medication and the [facility] in [place] still does not have the medications requested below. Please advise.

## 2020-03-17 ENCOUNTER — Telehealth: Payer: Self-pay

## 2020-03-17 DIAGNOSIS — R413 Other amnesia: Secondary | ICD-10-CM

## 2020-03-17 NOTE — Telephone Encounter (Signed)
Spoke to patient's daughter [name]) and she states that [facility] transferred patients medications to [facility] in [place] due to patients visiting other daughter Suanne Marker) and was out of medication.

## 2020-03-17 NOTE — Telephone Encounter (Signed)
Copied from [place] [id number]. Topic: General - Other >> Mar 17, 2020  1:10 PM Pawlus, Monica A wrote: Pt called in very upset that she still does not have her perscription to pick up at the [facility] [address] [place] [phone]. She wants a call back ASAP.

## 2020-03-17 NOTE — Telephone Encounter (Signed)
Pt is still trying to follow up on this request. Please advise.

## 2020-03-18 MED ORDER — DONEPEZIL HCL 10 MG PO TABS: 10.0000 mg | ORAL_TABLET | Freq: Every day | ORAL | 5 refills | Status: DC

## 2020-03-18 NOTE — Telephone Encounter (Signed)
RX sent to [facility] in [place].  Thanks,   -Mickel Baas

## 2020-03-26 DIAGNOSIS — J449 Chronic obstructive pulmonary disease, unspecified: Secondary | ICD-10-CM | POA: Diagnosis not present

## 2020-03-30 ENCOUNTER — Other Ambulatory Visit: Payer: Self-pay | Admitting: Family Medicine

## 2020-03-30 ENCOUNTER — Other Ambulatory Visit: Payer: Self-pay | Admitting: Physician Assistant

## 2020-03-30 MED ORDER — TRELEGY ELLIPTA 100-62.5-25 MCG/INH IN AEPB: INHALATION_SPRAY | RESPIRATORY_TRACT | 3 refills | Status: DC

## 2020-03-30 MED ORDER — ROSUVASTATIN CALCIUM 20 MG PO TABS: 20.0000 mg | ORAL_TABLET | Freq: Every day | ORAL | 1 refills | Status: DC

## 2020-03-30 MED ORDER — ONETOUCH ULTRA VI STRP: ORAL_STRIP | 1 refills | Status: DC

## 2020-03-30 MED ORDER — ALBUTEROL SULFATE HFA 108 (90 BASE) MCG/ACT IN AERS: 2.0000 | INHALATION_SPRAY | Freq: Four times a day (QID) | RESPIRATORY_TRACT | 0 refills | Status: DC | PRN

## 2020-03-30 NOTE — Telephone Encounter (Signed)
Pharmacy requesting dx code for Medicare part B as well as refills.

## 2020-03-30 NOTE — Telephone Encounter (Signed)
Copied from [place] [phone]. Topic: Quick Communication - Rx Refill/Question >> Mar 30, 2020 11:55 AM Tessa Lerner A wrote: Medication: ONETOUCH ULTRA test strip  Fluticasone-Umeclidin-Vilant (TRELEGY ELLIPTA) 100-62.5-25 MCG/INH AEPB  albuterol (VENTOLIN HFA) 108 (90 Base) MCG/ACT inhaler   Has the patient contacted their pharmacy? Yes. Patient's daughter has contact pharmacy previously and been directed to contact PCP  Preferred Pharmacy (with phone number or street name): [facility] [address] [place]  Phone:  [phone]  Agent: Please be advised that RX refills may take up to 3 business days. We ask that you follow-up with your pharmacy.

## 2020-03-30 NOTE — Telephone Encounter (Signed)
Medication: rosuvastatin (CRESTOR) 20 MG tablet [[id number]]   Has the patient contacted their pharmacy? YES  (Agent: If no, request that the patient contact the pharmacy for the refill.) (Agent: If yes, when and what did the pharmacy advise?)  Preferred Pharmacy (with phone number or street name): [facility] [address] [place] [phone] [place] [address] Phone: [phone] Fax: [phone] Hours: Not open 24 hours    Agent: Please be advised that RX refills may take up to 3 business days. We ask that you follow-up with your pharmacy.

## 2020-03-30 NOTE — Telephone Encounter (Signed)
Requested medication (s) are due for refill today: yes  Requested medication (s) are on the active medication list: yes  Last refill:  11/11/2019  Future visit scheduled: no  Notes to clinic:  Medication not assigned to a protocol, review manually   Requested Prescriptions  Pending Prescriptions Disp Refills   Fluticasone-Umeclidin-Vilant (TRELEGY ELLIPTA) 100-62.5-25 MCG/INH AEPB 60 each 3    Sig: INHALE 1 PUFF ONCE DAILY      Off-Protocol Failed - 03/30/2020 12:03 PM      Failed - Medication not assigned to a protocol, review manually.      Passed - Valid encounter within last 12 months    Recent Outpatient Visits           6 months ago Stomach pain   [facility] M, [place]   7 months ago Other acute gastritis without hemorrhage   [facility] [place], [place]   8 months ago Bilateral lower extremity edema   [place], PA-C   10 months ago Seasonal allergic rhinitis, unspecified trigger   [facility], Dionne Bucy, MD   11 months ago Venous stasis dermatitis of both lower extremities   [facility] [place], Dionne Bucy, MD                 Signed Prescriptions Disp Refills   glucose blood (ONETOUCH ULTRA) test strip 100 each 1    Sig: USE TEST STRIP(S) TO CHECK BLOOD SUGAR DAILY FOR TYPE 2 DIABETES      Endocrinology: Diabetes - Testing Supplies Passed - 03/30/2020 12:03 PM      Passed - Valid encounter within last 12 months    Recent Outpatient Visits           6 months ago Stomach pain   [facility], Anderson Malta M, [place]   7 months ago Other acute gastritis without hemorrhage   [facility], Anderson Malta M, [place]   8 months ago Bilateral lower extremity edema   [place], PA-C   10 months ago Seasonal allergic rhinitis, unspecified trigger   [facility], Dionne Bucy, MD   11 months ago Venous stasis dermatitis of both lower extremities   [facility], Dionne Bucy, MD                  albuterol (VENTOLIN HFA) 108 (90 Base) MCG/ACT inhaler 18 g 0    Sig: Inhale 2 puffs into the lungs every 6 (six) hours as needed for wheezing or shortness of breath.      Pulmonology:  Beta Agonists Failed - 03/30/2020 12:03 PM      Failed - One inhaler should last at least one month. If the patient is requesting refills earlier, contact the patient to check for uncontrolled symptoms.      Passed - Valid encounter within last 12 months    Recent Outpatient Visits           6 months ago Stomach pain   [place], [place]   7 months ago Other acute gastritis without hemorrhage   [place], [place]   8 months ago Bilateral lower extremity edema   [place], PA-C   10 months ago Seasonal allergic rhinitis, unspecified trigger   [facility] [place], Dionne Bucy, MD   11 months ago Venous  stasis dermatitis of both lower extremities   [facility] [place], Dionne Bucy, MD

## 2020-04-14 ENCOUNTER — Other Ambulatory Visit: Payer: Self-pay | Admitting: Family Medicine

## 2020-04-14 NOTE — Telephone Encounter (Signed)
Requested medication (s) are due for refill today: no  Requested medication (s) are on the active medication list: yes  Last refill:  01/11/2020  Future visit scheduled:no  Notes to clinic:  overdue for follow up and labs   Requested Prescriptions  Pending Prescriptions Disp Refills   losartan (COZAAR) 50 MG tablet [Pharmacy Med Name: Losartan Potassium 50 MG Oral Tablet] 90 tablet 0    Sig: Take 1 tablet by mouth once daily      Cardiovascular:  Angiotensin Receptor Blockers Failed - 04/14/2020 10:33 AM      Failed - Cr in normal range and within 180 days    Creat  Date Value Ref Range Status  12/23/2016 0.58 (L) 0.60 - 0.93 mg/dL Final    Comment:    For patients >41 years of age, the reference limit for Creatinine is approximately 13% higher for people identified as African-American. .    Creatinine, Ser  Date Value Ref Range Status  07/29/2019 0.69 0.44 - 1.00 mg/dL Final   Creatinine, POC  Date Value Ref Range Status  01/21/2016 n/a mg/dL Final          Failed - K in normal range and within 180 days    Potassium  Date Value Ref Range Status  07/29/2019 3.9 3.5 - 5.1 mmol/L Final          Failed - Valid encounter within last 6 months    Recent Outpatient Visits           7 months ago Stomach pain   [facility] Fenton Malling M, [place]   8 months ago Other acute gastritis without hemorrhage   [facility] F[place] Malling M, [place]   9 months ago Bilateral lower extremity edema   [place], PA-C   11 months ago Seasonal allergic rhinitis, unspecified trigger   [facility] [place], Dionne Bucy, MD   11 months ago Venous stasis dermatitis of both lower extremities   [facility] [place], Dionne Bucy, MD                Passed - Patient is not pregnant      Passed - Last BP in normal range    BP Readings from Last 1 Encounters:  09/12/19 115/66

## 2020-04-14 NOTE — Telephone Encounter (Signed)
Please review. Thanks!  

## 2020-05-28 ENCOUNTER — Encounter: Payer: Self-pay | Admitting: Gastroenterology

## 2020-05-28 ENCOUNTER — Other Ambulatory Visit: Payer: Self-pay

## 2020-05-28 ENCOUNTER — Ambulatory Visit: Payer: Medicare Other | Admitting: Gastroenterology

## 2020-05-28 VITALS — BP 144/76 | HR 49 | Ht 66.0 in | Wt 173.4 lb

## 2020-05-28 DIAGNOSIS — K581 Irritable bowel syndrome with constipation: Secondary | ICD-10-CM | POA: Diagnosis not present

## 2020-05-28 NOTE — Patient Instructions (Signed)
Patient was given samples of Linzess for a week in office today.

## 2020-05-28 NOTE — Progress Notes (Signed)
Jonathon Bellows MD, MRCP(U.K) [address]  [place]  [place], [place] [address]  Main: [phone]  Fax: [phone]   Primary Care Physician: Mar Daring, PA-C  Primary Gastroenterologist:  Dr. Jonathon Bellows   Follow-up for IBS constipation  HPI: Samantha Henry is a 75 y.o. female    Summary of history :  Initially referred and seen on 09/30/2019 for abdominal pain.She has previously been established and seen by her [facility] clinic gastroenterology.  Last visit was in February 2021.  At that time was being evaluated for iron deficiency anemia and abdominal pain.  She has a history of non-small cell lung cancer.  At that point of time plan was for an EGD and colonoscopy and subsequent follow-up.  She underwent a colonoscopy by Dr. Bary Castilla which was unremarkable except for diverticulosis of colon.  An EGD was also performed at the same time which showed no gross abnormalities.  A CT scan of the abdomen was performed on 08/28/2019 that demonstrated noninflamed diverticuli within the descending and sigmoid colon.  Degenerative changes of the lumbar spine was seen in the L5-S1 region and a 1.5 cm left renal cyst was noted.  On 07/29/2019 hemoglobin was 12.3 g with an MCV of 89.3 creatinine of 0.69 and normal liver function test.  She is on Aricept for dementia.   At her initial visit in 9/20/202  had generalized abdominal pain with no clear aggravating factors but relieved after good bowel movement.  At times she does not have a bowel movement for at least 2 days.  Following a bowel movement the pain is relieved.  Denied any NSAID use.  Pain is no worse by food intake.  Denied any rectal bleeding.    She is on thyroxine replacement and her last TSH was normal.  Interval history   09/12/2019-05/28/2020 09/13/2019: X-ray abdomen no significant burden.  Stool studies showed E. coli infection was having diarrhea, treated with azithromycin.  H. pylori breath test negative.  After the  course of azithromycin her diarrhea completely resolved and she has returned to having constipation with not having a good bowel movement for few days.  When she does have a bowel movement it is very hard.  She takes half a capful of MiraLAX every day.  She has abdominal discomfort preceding the bowel movement.  She has been taken off all narcotics.  Current Outpatient Medications  Medication Sig Dispense Refill  . donepezil (ARICEPT) 10 MG tablet Take 1 tablet (10 mg total) by mouth at bedtime. 30 tablet 5  . fluticasone (FLONASE) 50 MCG/ACT nasal spray Place 2 sprays into both nostrils daily. 16 g 6  . Fluticasone-Umeclidin-Vilant (TRELEGY ELLIPTA) 100-62.5-25 MCG/INH AEPB INHALE 1 PUFF ONCE DAILY 60 each 3  . levothyroxine (SYNTHROID) 125 MCG tablet TAKE 1 TABLET BY MOUTH ONCE DAILY BEFORE BREAKFAST 90 tablet 4  . montelukast (SINGULAIR) 10 MG tablet Take 10 mg by mouth at bedtime.    . pantoprazole (PROTONIX) 40 MG tablet Take 1 tablet by mouth once daily 90 tablet 0  . potassium chloride SA (KLOR-CON) 20 MEQ tablet Take 1 tablet by mouth once daily 90 tablet 4  . acetaminophen (TYLENOL) 650 MG CR tablet Take 500 mg by mouth every 8 (eight) hours as needed for pain.  (Patient not taking: Reported on 05/28/2020)    . albuterol (ACCUNEB) 0.63 MG/3ML nebulizer solution Inhale into the lungs. Take 3 mLs (0.63 mg total) by nebulization every 6 (six) hours as needed for Wheezing    .  albuterol (VENTOLIN HFA) 108 (90 Base) MCG/ACT inhaler Inhale 2 puffs into the lungs every 6 (six) hours as needed for wheezing or shortness of breath. (Patient not taking: Reported on 05/28/2020) 18 g 0  . Baclofen 5 MG TABS Take 1 tablet by mouth three times daily as needed (Patient not taking: No sig reported) 90 tablet 0  . Blood Glucose Monitoring Suppl (ONE TOUCH ULTRA 2) w/Device KIT Use daily to check blood sugar for type 2 diabetes E11.9 (Patient not taking: Reported on 05/28/2020) 1 kit 0  . calcium carbonate  (OS-CAL) 600 MG TABS tablet Take 500 mg by mouth 2 (two) times daily with a meal.  (Patient not taking: Reported on 05/28/2020)    . calcium carbonate (TUMS EX) 750 MG chewable tablet Chew 2 tablets by mouth 2 (two) times daily as needed for heartburn. (Patient not taking: No sig reported)    . cholecalciferol (VITAMIN D) 400 units TABS tablet Take 400 Units by mouth daily. (Patient not taking: Reported on 05/28/2020)    . diclofenac Sodium (VOLTAREN) 1 % GEL Voltaren 1 % topical gel  Apply to ankle twice daily. (Patient not taking: No sig reported)    . furosemide (LASIX) 20 MG tablet Take 40 mg by mouth daily at 2 PM.  (Patient not taking: No sig reported)    . glucose blood (ONETOUCH ULTRA) test strip USE AS DIRECTED TO CHECK GLUCOSE FOR DIABETES (Patient not taking: Reported on 05/28/2020) 100 each 1  . losartan (COZAAR) 50 MG tablet Take 1 tablet by mouth once daily (Patient not taking: Reported on 05/28/2020) 90 tablet 0  . metFORMIN (GLUCOPHAGE-XR) 500 MG 24 hr tablet TAKE 2 TABLETS BY MOUTH IN THE MORNING AND 2 TABLETS IN THE EVENING (Patient not taking: Reported on 05/28/2020) 360 tablet 0  . MULTIPLE VITAMIN PO Take 1 tablet by mouth daily. (Patient not taking: Reported on 05/28/2020)    . Omega-3 Fatty Acids (FISH OIL BURP-LESS) 1000 MG CAPS Take 1 tablet by mouth 2 (two) times daily.  (Patient not taking: No sig reported)    . OXYGEN Inhale 3 L into the lungs. Used at night and PRN during the day (Patient not taking: Reported on 05/28/2020)    . RABEprazole (ACIPHEX) 20 MG tablet Take 20 mg by mouth daily. (Patient not taking: Reported on 05/28/2020)    . rosuvastatin (CRESTOR) 20 MG tablet Take 1 tablet (20 mg total) by mouth daily. (Patient not taking: Reported on 05/28/2020) 90 tablet 1  . trolamine salicylate (ASPERCREME) 10 % cream Apply 1 application topically as needed for muscle pain.  (Patient not taking: No sig reported)    . vitamin B-12 (CYANOCOBALAMIN) 50 MCG tablet Take 50 mcg by  mouth daily. (Patient not taking: No sig reported)     No current facility-administered medications for this visit.    Allergies as of 05/28/2020 - Review Complete 05/28/2020  Allergen Reaction Noted  . Aspirin  07/25/2014  . Gabapentin  02/01/2018  . Lyrica [pregabalin] Other (See Comments) 07/24/2015  . Tetracycline Rash 07/25/2014    ROS:  General: Negative for anorexia, weight loss, fever, chills, fatigue, weakness. ENT: Negative for hoarseness, difficulty swallowing , nasal congestion. CV: Negative for chest pain, angina, palpitations, dyspnea on exertion, peripheral edema.  Respiratory: Negative for dyspnea at rest, dyspnea on exertion, cough, sputum, wheezing.  GI: See history of present illness. GU:  Negative for dysuria, hematuria, urinary incontinence, urinary frequency, nocturnal urination.  Endo: Negative for unusual weight change.  Physical Examination:   BP (!) 144/76 (BP Location: Left Arm, Patient Position: Sitting, Cuff Size: Normal)   Pulse (!) 49   Ht 5' 6"  (1.676 m)   Wt 173 lb 6.4 oz (78.7 kg)   BMI 27.99 kg/m   General: Well-nourished, well-developed in no acute distress.  Eyes: No icterus. Conjunctivae pink. Mouth: Oropharyngeal mucosa moist and pink , no lesions erythema or exudate. Neuro: Alert and oriented x 3.  Grossly intact. Skin: Warm and dry, no jaundice.   Psych: Alert and cooperative, normal mood and affect.   Imaging Studies: No results found.  Assessment and Plan:   LOUCILLE TAKACH is a 75 y.o. y/o female here to follow up for IBS-C.  On MiraLAX half a capful a day  Plan 1.    Increase MiraLAX to 1 cap twice a day and if no better commence on Linzess 290 mcg.  I have provided her 1 week of samples.  If this does not work to call me back.  If this does not work she has been advised to call us to get a 90-day prescription.  Dr Jonathon Bellows  MD,MRCP [facility]) Follow up as needed

## 2020-06-13 ENCOUNTER — Other Ambulatory Visit: Payer: Self-pay | Admitting: Physician Assistant

## 2020-06-13 ENCOUNTER — Other Ambulatory Visit: Payer: Self-pay | Admitting: Family Medicine

## 2020-06-13 DIAGNOSIS — K2900 Acute gastritis without bleeding: Secondary | ICD-10-CM

## 2020-06-13 NOTE — Telephone Encounter (Signed)
Requested medication (s) are due for refill today: yes  Requested medication (s) are on the active medication list: yes  Last refill:  01/07/19 #90 4 RF  Future visit scheduled: no  Notes to clinic:  Did not see Euthyrox on active med list. Levothyroxine is present  and has no end date - pt due 7/22 for lab work.   Requested Prescriptions  Pending Prescriptions Disp Refills   EUTHYROX 125 MCG tablet [Pharmacy Med Name: Euthyrox 125 MCG Oral Tablet] 90 tablet 0    Sig: TAKE 1 TABLET BY MOUTH ONCE DAILY BEFORE BREAKFAST      Endocrinology:  Hypothyroid Agents Failed - 06/13/2020 11:40 AM      Failed - TSH needs to be rechecked within 3 months after an abnormal result. Refill until TSH is due.      Passed - TSH in normal range and within 360 days    TSH  Date Value Ref Range Status  07/16/2019 2.740 0.450 - 4.500 uIU/mL Final          Passed - Valid encounter within last 12 months    Recent Outpatient Visits           9 months ago Stomach pain   [place], [place]   10 months ago Other acute gastritis without hemorrhage   [place], [place]   11 months ago Bilateral lower extremity edema   [place], PA-C   1 year ago Seasonal allergic rhinitis, unspecified trigger   [facility], Dionne Bucy, MD   1 year ago Venous stasis dermatitis of both lower extremities   [facility], Dionne Bucy, MD

## 2020-06-22 ENCOUNTER — Encounter: Payer: Self-pay | Admitting: Gastroenterology

## 2020-06-22 ENCOUNTER — Other Ambulatory Visit: Payer: Self-pay

## 2020-06-22 ENCOUNTER — Ambulatory Visit: Payer: Medicare Other | Admitting: Gastroenterology

## 2020-06-22 VITALS — BP 123/58 | HR 62 | Temp 98.3°F | Ht 66.0 in | Wt 175.0 lb

## 2020-06-22 DIAGNOSIS — M25562 Pain in left knee: Secondary | ICD-10-CM | POA: Insufficient documentation

## 2020-06-22 DIAGNOSIS — S8002XA Contusion of left knee, initial encounter: Secondary | ICD-10-CM | POA: Insufficient documentation

## 2020-06-22 DIAGNOSIS — M179 Osteoarthritis of knee, unspecified: Secondary | ICD-10-CM | POA: Insufficient documentation

## 2020-06-22 DIAGNOSIS — M7989 Other specified soft tissue disorders: Secondary | ICD-10-CM | POA: Insufficient documentation

## 2020-06-22 DIAGNOSIS — R197 Diarrhea, unspecified: Secondary | ICD-10-CM | POA: Diagnosis not present

## 2020-06-22 DIAGNOSIS — S8000XA Contusion of unspecified knee, initial encounter: Secondary | ICD-10-CM | POA: Insufficient documentation

## 2020-06-22 NOTE — Progress Notes (Signed)
Jonathon Bellows MD, MRCP(U.K) [address]  [place]  [place], [place] [address]  Main: [phone]  Fax: [phone]   Primary Care Physician: Mar Daring, PA-C  Primary Gastroenterologist:  Dr. Jonathon Bellows   Follow-up for IBS constipation  HPI: Samantha Henry is a 75 y.o. female  Summary of history :   Initially referred and seen on 09/30/2019 for abdominal pain.She has previously been established and seen by [facility] clinic gastroenterology.  Last visit was in February 2021.  At that time was being evaluated for iron deficiency anemia and abdominal pain.  She has a history of non-small cell lung cancer.  At that point of time plan was for an EGD and colonoscopy and subsequent follow-up.  She underwent a colonoscopy by Dr. Bary Castilla which was unremarkable except for diverticulosis of colon.  An EGD was also performed at the same time which showed no gross abnormalities.  A CT scan of the abdomen was performed on 08/28/2019 that demonstrated noninflamed diverticuli within the descending and sigmoid colon.  Degenerative changes of the lumbar spine was seen in the L5-S1 region and a 1.5 cm left renal cyst was noted.  On 07/29/2019 hemoglobin was 12.3 g with an MCV of 89.3 creatinine of 0.69 and normal liver function test.  She is on Aricept for dementia.     At her initial visit in 9/20/202  had generalized abdominal pain with no clear aggravating factors but relieved after good bowel movement.  At times she does not have a bowel movement for at least 2 days.  Following a bowel movement the pain is relieved.  Denied any NSAID use.  Pain is no worse by food intake.  Denied any rectal bleeding.   09/13/2019: X-ray abdomen no significant burden.  Stool studies showed E. coli infection was having diarrhea, treated with azithromycin.  H. pylori breath test negative.   After the course of azithromycin her diarrhea completely resolved and she has returned to having constipation with not  having a good bowel movement for few days.  She is on thyroxine replacement and her last TSH was normal.   Interval history 05/28/2020-06/22/2020 She was taking MiraLAX 1 cap daily.  Was still having issues with abdominal pain and started taking the Linzess 290 mcg daily.  Samples of which were provided to her.  She started this 5 days back.  And over the last few days has had severe cramping and multiple episodes of diarrhea.    Current Outpatient Medications  Medication Sig Dispense Refill   acetaminophen (TYLENOL) 650 MG CR tablet Take 500 mg by mouth every 8 (eight) hours as needed for pain.     albuterol (ACCUNEB) 0.63 MG/3ML nebulizer solution Inhale into the lungs. Take 3 mLs (0.63 mg total) by nebulization every 6 (six) hours as needed for Wheezing     albuterol (VENTOLIN HFA) 108 (90 Base) MCG/ACT inhaler Inhale 2 puffs into the lungs every 6 (six) hours as needed for wheezing or shortness of breath. 18 g 0   Baclofen 5 MG TABS Take 1 tablet by mouth three times daily as needed 90 tablet 0   Blood Glucose Monitoring Suppl (ONE TOUCH ULTRA 2) w/Device KIT Use daily to check blood sugar for type 2 diabetes E11.9 1 kit 0   calcium carbonate (OS-CAL) 600 MG TABS tablet Take 500 mg by mouth 2 (two) times daily with a meal.     calcium carbonate (TUMS EX) 750 MG chewable tablet Chew 2 tablets  by mouth 2 (two) times daily as needed for heartburn.     cholecalciferol (VITAMIN D) 400 units TABS tablet Take 400 Units by mouth daily.     diclofenac Sodium (VOLTAREN) 1 % GEL Voltaren 1 % topical gel  Apply to ankle twice daily.     donepezil (ARICEPT) 10 MG tablet Take 1 tablet (10 mg total) by mouth at bedtime. 30 tablet 5   EUTHYROX 125 MCG tablet TAKE 1 TABLET BY MOUTH ONCE DAILY BEFORE BREAKFAST 90 tablet 0   fluticasone (FLONASE) 50 MCG/ACT nasal spray Place 2 sprays into both nostrils daily. 16 g 6   Fluticasone-Umeclidin-Vilant (TRELEGY ELLIPTA) 100-62.5-25 MCG/INH AEPB INHALE 1 PUFF ONCE  DAILY 60 each 3   furosemide (LASIX) 20 MG tablet Take 40 mg by mouth daily at 2 PM.     glucose blood (ONETOUCH ULTRA) test strip USE AS DIRECTED TO CHECK GLUCOSE FOR DIABETES 100 each 1   losartan (COZAAR) 50 MG tablet Take 1 tablet by mouth once daily 90 tablet 0   metFORMIN (GLUCOPHAGE-XR) 500 MG 24 hr tablet TAKE 2 TABLETS BY MOUTH IN THE MORNING AND 2 TABLETS IN THE EVENING 360 tablet 0   montelukast (SINGULAIR) 10 MG tablet Take 10 mg by mouth at bedtime.     MULTIPLE VITAMIN PO Take 1 tablet by mouth daily.     Omega-3 Fatty Acids (FISH OIL BURP-LESS) 1000 MG CAPS Take 1 tablet by mouth 2 (two) times daily.     OXYGEN Inhale 3 L into the lungs. Used at night and PRN during the day     pantoprazole (PROTONIX) 40 MG tablet Take 1 tablet by mouth once daily 90 tablet 0   polyethylene glycol (MIRALAX / GLYCOLAX) 17 g packet Take 17 g by mouth 2 (two) times daily.     potassium chloride SA (KLOR-CON) 20 MEQ tablet Take 1 tablet by mouth once daily 90 tablet 4   RABEprazole (ACIPHEX) 20 MG tablet Take 20 mg by mouth daily.     rosuvastatin (CRESTOR) 20 MG tablet Take 1 tablet (20 mg total) by mouth daily. 90 tablet 1   trolamine salicylate (ASPERCREME) 10 % cream Apply 1 application topically as needed for muscle pain.     vitamin B-12 (CYANOCOBALAMIN) 50 MCG tablet Take 50 mcg by mouth daily.     No current facility-administered medications for this visit.    Allergies as of 06/22/2020 - Review Complete 06/22/2020  Allergen Reaction Noted   Aspirin  07/25/2014   Gabapentin  02/01/2018   Lyrica [pregabalin] Other (See Comments) 07/24/2015   Tetracycline Rash 07/25/2014    ROS:  General: Negative for anorexia, weight loss, fever, chills, fatigue, weakness. ENT: Negative for hoarseness, difficulty swallowing , nasal congestion. CV: Negative for chest pain, angina, palpitations, dyspnea on exertion, peripheral edema.  Respiratory: Negative for dyspnea at rest, dyspnea on exertion,  cough, sputum, wheezing.  GI: See history of present illness. GU:  Negative for dysuria, hematuria, urinary incontinence, urinary frequency, nocturnal urination.  Endo: Negative for unusual weight change.    Physical Examination:   BP (!) 123/58   Pulse 62   Temp 98.3 F (36.8 C) (Oral)   Ht _0  (1.676 m)   Wt 175 lb (79.4 kg)   BMI 28.25 kg/m   General: Well-nourished, well-developed in no acute distress.  Eyes: No icterus. Conjunctivae pink. Mouth: Oropharyngeal mucosa moist and pink , no lesions erythema or exudate. Lungs: Clear to auscultation bilaterally. Non-labored. Heart: Regular rate and  rhythm, no murmurs rubs or gallops.  Abdomen: Bowel sounds are normal, nontender, nondistended, no hepatosplenomegaly or masses, no abdominal bruits or hernia , no rebound or guarding.   Extremities: No lower extremity edema. No clubbing or deformities. Neuro: Alert and oriented x 3.  Grossly intact. Skin: Warm and dry, no jaundice.   Psych: Alert and cooperative, normal mood and affect.   Imaging Studies: No results found.  Assessment and Plan:   Samantha Henry is a 75 y.o. y/o female here to follow up for IBS-C.  Started Linzess 290 mcg in addition to MiraLAX 1 cap twice daily.  Started having cramping and diarrhea.  It is very likely that the symptoms are due to the effects of Linzess.  I have suggested her to stop taking the Linzess and I will give her a call day after tomorrow check how she is doing.  Depending on how she is doing may need to order further tests.    Dr Jonathon Bellows  MD,MRCP [facility]) Follow up in the office tomorrow at 8:15 AM telephone visit

## 2020-06-23 NOTE — Telephone Encounter (Signed)
Requested Prescriptions  Pending Prescriptions Disp Refills  . pantoprazole (PROTONIX) 40 MG tablet [Pharmacy Med Name: Pantoprazole Sodium 40 MG Oral Tablet Delayed Release] 90 tablet 0    Sig: Take 1 tablet by mouth once daily     Gastroenterology: Proton Pump Inhibitors Passed - 06/23/2020  9:14 AM      Passed - Valid encounter within last 12 months    Recent Outpatient Visits          9 months ago Stomach pain   [place], [place]   10 months ago Other acute gastritis without hemorrhage   [place], [place]   11 months ago Bilateral lower extremity edema   [place], PA-C   1 year ago Seasonal allergic rhinitis, unspecified trigger   [facility], Dionne Bucy, MD   1 year ago Venous stasis dermatitis of both lower extremities   [facility], Dionne Bucy, MD      Future Appointments            Tomorrow Jonathon Bellows, MD [place]

## 2020-06-23 NOTE — Telephone Encounter (Signed)
Patient called in about her medication refill, pantoprazole (PROTONIX) 40 MG tablet , that was requested last week and patient hasn't received and the pharmacy is looking for update on this. Please call back

## 2020-06-24 ENCOUNTER — Telehealth (INDEPENDENT_AMBULATORY_CARE_PROVIDER_SITE_OTHER): Payer: Medicare Other | Admitting: Gastroenterology

## 2020-06-24 ENCOUNTER — Encounter: Payer: Self-pay | Admitting: Gastroenterology

## 2020-06-24 DIAGNOSIS — R197 Diarrhea, unspecified: Secondary | ICD-10-CM | POA: Diagnosis not present

## 2020-06-24 DIAGNOSIS — R109 Unspecified abdominal pain: Secondary | ICD-10-CM

## 2020-06-24 NOTE — Addendum Note (Signed)
Addended by: Wayna Chalet on: 06/24/2020 09:03 AM   Modules accepted: Orders

## 2020-06-24 NOTE — Progress Notes (Signed)
Jonathon Bellows , MD [address]  [place]  [place], [place] [address]  Main: [phone]  Fax: [phone]   Primary Care Physician: Mar Daring, PA-C  Virtual Visit via Telephone Note  I connected with patient on 06/24/20 at  8:15 AM EDT by telephone and verified that I am speaking with the correct person using two identifiers.   I discussed the limitations, risks, security and privacy concerns of performing an evaluation and management service by telephone and the availability of in person appointments. I also discussed with the patient that there may be a patient responsible charge related to this service. The patient expressed understanding and agreed to proceed.  Location of Patient: Home Location of Provider: Home Persons involved: Patient and provider only   History of Present Illness:   F/u for diarrhea and abdominal cramping    HPI: Samantha Henry is a 75 y.o. female  Summary of history :   Initially referred and seen on 09/30/2019 for abdominal pain.She has previously been established and seen by [facility] gastroenterology.  Last visit was in February 2021.  At that time was being evaluated for iron deficiency anemia and abdominal pain.  She has a history of non-small cell lung cancer.  At that point of time plan was for an EGD and colonoscopy and subsequent follow-up.  She underwent a colonoscopy by Dr. Bary Castilla which was unremarkable except for diverticulosis of colon.  An EGD was also performed at the same time which showed no gross abnormalities.  A CT scan of the abdomen was performed on 08/28/2019 that demonstrated noninflamed diverticuli within the descending and sigmoid colon.  Degenerative changes of the lumbar spine was seen in the L5-S1 region and a 1.5 cm left renal cyst was noted.  On 07/29/2019 hemoglobin was 12.3 g with an MCV of 89.3 creatinine of 0.69 and normal liver function test.  She is on Aricept for dementia.     At her initial  visit in 9/20/202  had generalized abdominal pain with no clear aggravating factors but relieved after good bowel movement.  At times she does not have a bowel movement for at least 2 days.  Following a bowel movement the pain is relieved.  Denied any NSAID use.  Pain is no worse by food intake.  Denied any rectal bleeding.    09/13/2019: X-ray abdomen no significant burden.  Stool studies showed E. coli infection was having diarrhea, treated with azithromycin.  H. pylori breath test negative.   After the course of azithromycin her diarrhea completely resolved and she has returned to having constipation with not having a good bowel movement for few days. She is on thyroxine replacement and her last TSH was normal.  She was taking MiraLAX 1 cap daily.  Was still having issues with abdominal pain and started taking the Linzess 290 mcg daily.  Samples of which were provided to her.  She started this 7 days back.  And over the last few days has had severe cramping and multiple episodes of diarrhea.     Interval history 06/22/2020-06/24/2020   I called her today to find out how she was doing.  I spoke to the daughter who stated that the diarrhea had resolved after stopping the Linzess but continues to have abdominal discomfort.  Describes it as a knot in her abdomen.  Not similar to what she normally has.  No other symptoms.  She is not having diarrhea but having small quantity of stool  each time she has a bowel movement.   Current Outpatient Medications  Medication Sig Dispense Refill   acetaminophen (TYLENOL) 650 MG CR tablet Take 500 mg by mouth every 8 (eight) hours as needed for pain.     albuterol (ACCUNEB) 0.63 MG/3ML nebulizer solution Inhale into the lungs. Take 3 mLs (0.63 mg total) by nebulization every 6 (six) hours as needed for Wheezing     albuterol (VENTOLIN HFA) 108 (90 Base) MCG/ACT inhaler Inhale 2 puffs into the lungs every 6 (six) hours as needed for wheezing or shortness of breath.  18 g 0   Baclofen 5 MG TABS Take 1 tablet by mouth three times daily as needed 90 tablet 0   Blood Glucose Monitoring Suppl (ONE TOUCH ULTRA 2) w/Device KIT Use daily to check blood sugar for type 2 diabetes E11.9 1 kit 0   calcium carbonate (OS-CAL) 600 MG TABS tablet Take 500 mg by mouth 2 (two) times daily with a meal.     calcium carbonate (TUMS EX) 750 MG chewable tablet Chew 2 tablets by mouth 2 (two) times daily as needed for heartburn.     cholecalciferol (VITAMIN D) 400 units TABS tablet Take 400 Units by mouth daily.     diclofenac Sodium (VOLTAREN) 1 % GEL Voltaren 1 % topical gel  Apply to ankle twice daily.     donepezil (ARICEPT) 10 MG tablet Take 1 tablet (10 mg total) by mouth at bedtime. 30 tablet 5   EUTHYROX 125 MCG tablet TAKE 1 TABLET BY MOUTH ONCE DAILY BEFORE BREAKFAST 90 tablet 0   fluticasone (FLONASE) 50 MCG/ACT nasal spray Place 2 sprays into both nostrils daily. 16 g 6   Fluticasone-Umeclidin-Vilant (TRELEGY ELLIPTA) 100-62.5-25 MCG/INH AEPB INHALE 1 PUFF ONCE DAILY 60 each 3   furosemide (LASIX) 20 MG tablet Take 40 mg by mouth daily at 2 PM.     glucose blood (ONETOUCH ULTRA) test strip USE AS DIRECTED TO CHECK GLUCOSE FOR DIABETES 100 each 1   losartan (COZAAR) 50 MG tablet Take 1 tablet by mouth once daily 90 tablet 0   metFORMIN (GLUCOPHAGE-XR) 500 MG 24 hr tablet TAKE 2 TABLETS BY MOUTH IN THE MORNING AND 2 TABLETS IN THE EVENING 360 tablet 0   montelukast (SINGULAIR) 10 MG tablet Take 10 mg by mouth at bedtime.     MULTIPLE VITAMIN PO Take 1 tablet by mouth daily.     Omega-3 Fatty Acids (FISH OIL BURP-LESS) 1000 MG CAPS Take 1 tablet by mouth 2 (two) times daily.     OXYGEN Inhale 3 L into the lungs. Used at night and PRN during the day     pantoprazole (PROTONIX) 40 MG tablet Take 1 tablet by mouth once daily 90 tablet 0   polyethylene glycol (MIRALAX / GLYCOLAX) 17 g packet Take 17 g by mouth 2 (two) times daily.     potassium chloride SA (KLOR-CON) 20 MEQ  tablet Take 1 tablet by mouth once daily 90 tablet 4   RABEprazole (ACIPHEX) 20 MG tablet Take 20 mg by mouth daily.     rosuvastatin (CRESTOR) 20 MG tablet Take 1 tablet (20 mg total) by mouth daily. 90 tablet 1   trolamine salicylate (ASPERCREME) 10 % cream Apply 1 application topically as needed for muscle pain.     vitamin B-12 (CYANOCOBALAMIN) 50 MCG tablet Take 50 mcg by mouth daily.     No current facility-administered medications for this visit.    Allergies as of 06/24/2020 - Review  Complete 06/22/2020  Allergen Reaction Noted   Aspirin  07/25/2014   Gabapentin  02/01/2018   Lyrica [pregabalin] Other (See Comments) 07/24/2015   Tetracycline Rash 07/25/2014    Review of Systems:    All systems reviewed and negative except where noted in HPI.   Observations/Objective:  Labs: CMP     Component Value Date/Time   NA 136 07/29/2019 1130   NA 136 07/16/2019 1522   K 3.9 07/29/2019 1130   CL 104 07/29/2019 1130   CO2 28 07/29/2019 1130   GLUCOSE 137 (H) 07/29/2019 1130   BUN 10 07/29/2019 1130   BUN 10 07/16/2019 1522   CREATININE 0.69 07/29/2019 1130   CREATININE 0.58 (L) 12/23/2016 1141   CALCIUM 9.2 07/29/2019 1130   PROT 7.1 07/29/2019 1130   PROT 6.6 07/16/2019 1522   ALBUMIN 4.0 07/29/2019 1130   ALBUMIN 4.5 07/16/2019 1522   AST 25 07/29/2019 1130   ALT 17 07/29/2019 1130   ALKPHOS 47 07/29/2019 1130   BILITOT 0.5 07/29/2019 1130   BILITOT 0.2 07/16/2019 1522   GFRNONAA >60 07/29/2019 1130   GFRNONAA 93 12/23/2016 1141   GFRAA >60 07/29/2019 1130   GFRAA 108 12/23/2016 1141   Lab Results  Component Value Date   WBC 5.0 07/29/2019   HGB 12.3 07/29/2019   HCT 36.7 07/29/2019   MCV 89.3 07/29/2019   PLT 173 07/29/2019    Imaging Studies: No results found.  Assessment and Plan:   Samantha Henry is a 75 y.o. y/o female  here to follow up for IBS-C.  Started Linzess 290 mcg in addition to MiraLAX 1 cap twice daily.  Started having cramping and  diarrhea.  It is very likely that the symptoms are due to the effects of Linzess.  After cessation of her Linzess she continues to have abdominal pain but no diarrhea.  I would like to find out if this abdominal pain is something new such as diverticulitis and hence we will proceed with a CAT scan of the abdomen and pelvis.  We will also obtain a CBC and CMP to ensure she is not dehydrated.  I will give her a call back once I get the results.  The daughter was happy with the plan and will call us if something changes.    I discussed the assessment and treatment plan with the patient. The patient was provided an opportunity to ask questions and all were answered. The patient agreed with the plan and demonstrated an understanding of the instructions.   The patient was advised to call back or seek an in-person evaluation if the symptoms worsen or if the condition fails to improve as anticipated.  I provided 12 minutes of non-face-to-face time during this encounter.  Dr Jonathon Bellows MD,MRCP [facility]) Gastroenterology/Hepatology Pager: [phone]   Speech recognition software was used to dictate this note.

## 2020-06-30 LAB — COMPREHENSIVE METABOLIC PANEL
ALT: 14 IU/L (ref 0–32)
AST: 17 IU/L (ref 0–40)
Albumin/Globulin Ratio: 1.9 (ref 1.2–2.2)
Albumin: 4.3 g/dL (ref 3.7–4.7)
Alkaline Phosphatase: 100 IU/L (ref 44–121)
BUN/Creatinine Ratio: 19 (ref 12–28)
BUN: 13 mg/dL (ref 8–27)
Bilirubin Total: 0.3 mg/dL (ref 0.0–1.2)
CO2: 25 mmol/L (ref 20–29)
Calcium: 9.2 mg/dL (ref 8.7–10.3)
Chloride: 101 mmol/L (ref 96–106)
Creatinine, Ser: 0.67 mg/dL (ref 0.57–1.00)
Globulin, Total: 2.3 g/dL (ref 1.5–4.5)
Glucose: 89 mg/dL (ref 65–99)
Potassium: 4.3 mmol/L (ref 3.5–5.2)
Sodium: 138 mmol/L (ref 134–144)
Total Protein: 6.6 g/dL (ref 6.0–8.5)
eGFR: 92 mL/min/{1.73_m2} (ref >59)

## 2020-06-30 LAB — CBC
Hematocrit: 36.7 % (ref 34.0–46.6)
Hemoglobin: 12.6 g/dL (ref 11.1–15.9)
MCH: 31.1 pg (ref 26.6–33.0)
MCHC: 34.3 g/dL (ref 31.5–35.7)
MCV: 91 fL (ref 79–97)
Platelets: 123 10*3/uL — ABNORMAL LOW (ref 150–450)
RBC: 4.05 x10E6/uL (ref 3.77–5.28)
RDW: 12.5 % (ref 11.7–15.4)
WBC: 5.2 10*3/uL (ref 3.4–10.8)

## 2020-07-03 ENCOUNTER — Ambulatory Visit
Admission: RE | Admit: 2020-07-03 | Discharge: 2020-07-03 | Disposition: A | Discharge status: Home / Self Care | Payer: Medicare (Managed Care) | Source: Ambulatory Visit | Attending: Gastroenterology | Admitting: Gastroenterology

## 2020-07-03 ENCOUNTER — Other Ambulatory Visit: Payer: Self-pay

## 2020-07-03 DIAGNOSIS — R197 Diarrhea, unspecified: Secondary | ICD-10-CM | POA: Diagnosis present

## 2020-07-03 DIAGNOSIS — R109 Unspecified abdominal pain: Secondary | ICD-10-CM | POA: Diagnosis not present

## 2020-07-08 ENCOUNTER — Telehealth: Payer: Self-pay

## 2020-07-08 NOTE — Telephone Encounter (Signed)
-----   Message from Jonathon Bellows, MD sent at 07/06/2020 12:47 PM EDT ----- Inform   1 . CT scan is normal  2. Labs also normal  3. Check how she is doing

## 2020-07-08 NOTE — Telephone Encounter (Signed)
Called patient to let her know that Dr. Vicente Males wants her to try taking IBgard and if it does not helps, to let us know and then he will start her on Bentyl 10 mg PRN TID for pain. Patient stated that she would pass by tomorrow to pick up the samples and if she needed the prescription for Bentyl, then she would call us to let us know. Patient had no further questions.

## 2020-07-08 NOTE — Telephone Encounter (Signed)
Called patient's daughter-Wendy to let her know the below information. [name] atated that she was happy for her mother's results but she knew and stated that her mother was still not feeling well. Patient continues to have abdominal pain and nausea, therefore, daughter wants to know what she is able to do for her to feel better. Please advise .

## 2020-07-27 ENCOUNTER — Other Ambulatory Visit: Payer: Self-pay | Admitting: Family Medicine

## 2020-07-28 NOTE — Telephone Encounter (Signed)
Requested medication (s) are due for refill today: yes  Requested medication (s) are on the active medication list:  yes  Last refill:  05/24/2020  Future visit scheduled: no  Notes to clinic:  Medication not assigned to a protocol, review manually  Requested Prescriptions  Pending Prescriptions Disp Refills   [place] 100-62.5-25 MCG/INH AEPB [Pharmacy Med Name: Trelegy Ellipta 100-62.5-25 MCG/INH Inhalation Aerosol Powder Breath Activated] 180 each 0    Sig: INHALE 1 PUFF ONCE DAILY      Off-Protocol Failed - 07/27/2020  7:54 PM      Failed - Medication not assigned to a protocol, review manually.      Passed - Valid encounter within last 12 months    Recent Outpatient Visits           10 months ago Stomach pain   [place], [place]   11 months ago Other acute gastritis without hemorrhage   [place], [place]   1 year ago Bilateral lower extremity edema   [place], [facility]   1 year ago Seasonal allergic rhinitis, unspecified trigger   [facility], Dionne Bucy, [place]   1 year ago Venous stasis dermatitis of both lower extremities   [facility] Dionne Bucy, MD

## 2020-08-03 ENCOUNTER — Telehealth: Payer: Self-pay | Admitting: Gastroenterology

## 2020-08-03 NOTE — Telephone Encounter (Signed)
Patient wants to discuss getting IBS samples with CMA.

## 2020-08-03 NOTE — Telephone Encounter (Signed)
Called patient back and she stated that the medication IBGard have been helping her. Therefore, she wanted to know if she could get a prescription. However, I told her that she could get this medication over-the-counter since her insurance does not cover. Patient agreed and had no further questions.

## 2020-08-05 ENCOUNTER — Telehealth: Payer: Self-pay | Admitting: *Deleted

## 2020-08-05 NOTE — Chronic Care Management (AMB) (Signed)
Please note that the patient has been referred for care management services but has not had a face to face visit with the primary care provider in the last 12 month period. This patient has been referred to the appropriate practice personnel for assistance with scheduling a PCP appointment.      

## 2020-08-17 ENCOUNTER — Other Ambulatory Visit: Payer: Self-pay | Admitting: Family Medicine

## 2020-08-17 ENCOUNTER — Telehealth: Payer: Self-pay

## 2020-08-17 DIAGNOSIS — E7800 Pure hypercholesterolemia, unspecified: Secondary | ICD-10-CM

## 2020-08-17 DIAGNOSIS — E1149 Type 2 diabetes mellitus with other diabetic neurological complication: Secondary | ICD-10-CM

## 2020-08-17 DIAGNOSIS — E876 Hypokalemia: Secondary | ICD-10-CM

## 2020-08-17 DIAGNOSIS — E039 Hypothyroidism, unspecified: Secondary | ICD-10-CM

## 2020-08-17 DIAGNOSIS — J449 Chronic obstructive pulmonary disease, unspecified: Secondary | ICD-10-CM

## 2020-08-17 NOTE — Telephone Encounter (Signed)
Placed future orders in chart to be released when she comes to the office for fasting blood draw.

## 2020-08-17 NOTE — Telephone Encounter (Signed)
Copied from [id number]. Topic: Appointment Scheduling - Scheduling Inquiry for Clinic >> Aug 17, 2020 11:42 AM Erick Blinks wrote: Reason for CRM: Pt's daughter [name] called and is requesting lab work to check her medications prior to her upcomming appt. Please advise Best contact: [phone]  Requesting fasting lab work

## 2020-08-18 NOTE — Telephone Encounter (Signed)
Advised 

## 2020-08-20 ENCOUNTER — Other Ambulatory Visit: Payer: Self-pay

## 2020-08-20 DIAGNOSIS — E876 Hypokalemia: Secondary | ICD-10-CM

## 2020-08-20 DIAGNOSIS — E1149 Type 2 diabetes mellitus with other diabetic neurological complication: Secondary | ICD-10-CM

## 2020-08-20 DIAGNOSIS — J449 Chronic obstructive pulmonary disease, unspecified: Secondary | ICD-10-CM

## 2020-08-20 DIAGNOSIS — E7800 Pure hypercholesterolemia, unspecified: Secondary | ICD-10-CM

## 2020-08-20 DIAGNOSIS — E039 Hypothyroidism, unspecified: Secondary | ICD-10-CM

## 2020-08-21 LAB — COMPREHENSIVE METABOLIC PANEL
ALT: 10 IU/L (ref 0–32)
AST: 18 IU/L (ref 0–40)
Albumin/Globulin Ratio: 1.6 (ref 1.2–2.2)
Albumin: 4.1 g/dL (ref 3.7–4.7)
Alkaline Phosphatase: 93 IU/L (ref 44–121)
BUN/Creatinine Ratio: 22 (ref 12–28)
BUN: 15 mg/dL (ref 8–27)
Bilirubin Total: 0.4 mg/dL (ref 0.0–1.2)
CO2: 24 mmol/L (ref 20–29)
Calcium: 9.3 mg/dL (ref 8.7–10.3)
Chloride: 102 mmol/L (ref 96–106)
Creatinine, Ser: 0.67 mg/dL (ref 0.57–1.00)
Globulin, Total: 2.6 g/dL (ref 1.5–4.5)
Glucose: 87 mg/dL (ref 65–99)
Potassium: 4.4 mmol/L (ref 3.5–5.2)
Sodium: 139 mmol/L (ref 134–144)
Total Protein: 6.7 g/dL (ref 6.0–8.5)
eGFR: 92 mL/min/{1.73_m2} (ref >59)

## 2020-08-21 LAB — CBC WITH DIFFERENTIAL/PLATELET
Basophils Absolute: 0 10*3/uL (ref 0.0–0.2)
Basos: 1 %
EOS (ABSOLUTE): 0.1 10*3/uL (ref 0.0–0.4)
Eos: 2 %
Hematocrit: 38.9 % (ref 34.0–46.6)
Hemoglobin: 12.9 g/dL (ref 11.1–15.9)
Immature Grans (Abs): 0 10*3/uL (ref 0.0–0.1)
Immature Granulocytes: 0 %
Lymphocytes Absolute: 1.9 10*3/uL (ref 0.7–3.1)
Lymphs: 43 %
MCH: 30.9 pg (ref 26.6–33.0)
MCHC: 33.2 g/dL (ref 31.5–35.7)
MCV: 93 fL (ref 79–97)
Monocytes Absolute: 0.5 10*3/uL (ref 0.1–0.9)
Monocytes: 11 %
Neutrophils Absolute: 1.9 10*3/uL (ref 1.4–7.0)
Neutrophils: 43 %
Platelets: 133 10*3/uL — ABNORMAL LOW (ref 150–450)
RBC: 4.18 x10E6/uL (ref 3.77–5.28)
RDW: 13.1 % (ref 11.7–15.4)
WBC: 4.3 10*3/uL (ref 3.4–10.8)

## 2020-08-21 LAB — LIPID PANEL
Chol/HDL Ratio: 4.2 ratio (ref 0.0–4.4)
Cholesterol, Total: 305 mg/dL — ABNORMAL HIGH (ref 100–199)
HDL: 73 mg/dL (ref >39)
LDL Chol Calc (NIH): 225 mg/dL — ABNORMAL HIGH (ref 0–99)
Triglycerides: 54 mg/dL (ref 0–149)
VLDL Cholesterol Cal: 7 mg/dL (ref 5–40)

## 2020-08-21 LAB — HEMOGLOBIN A1C
Est. average glucose Bld gHb Est-mCnc: 117 mg/dL
Hgb A1c MFr Bld: 5.7 % — ABNORMAL HIGH (ref 4.8–5.6)

## 2020-08-21 LAB — TSH: TSH: 0.508 u[IU]/mL (ref 0.450–4.500)

## 2020-08-21 LAB — T4: T4, Total: 10.6 ug/dL (ref 4.5–12.0)

## 2020-09-01 ENCOUNTER — Encounter: Payer: Self-pay | Admitting: Family Medicine

## 2020-09-01 ENCOUNTER — Other Ambulatory Visit: Payer: Self-pay

## 2020-09-01 ENCOUNTER — Ambulatory Visit (INDEPENDENT_AMBULATORY_CARE_PROVIDER_SITE_OTHER): Payer: Medicare Other | Admitting: Family Medicine

## 2020-09-01 VITALS — BP 113/49 | HR 57 | Temp 98.6°F | Wt 180.0 lb

## 2020-09-01 DIAGNOSIS — K581 Irritable bowel syndrome with constipation: Secondary | ICD-10-CM

## 2020-09-01 DIAGNOSIS — J449 Chronic obstructive pulmonary disease, unspecified: Secondary | ICD-10-CM | POA: Diagnosis not present

## 2020-09-01 DIAGNOSIS — M545 Low back pain, unspecified: Secondary | ICD-10-CM | POA: Diagnosis not present

## 2020-09-01 DIAGNOSIS — Z8719 Personal history of other diseases of the digestive system: Secondary | ICD-10-CM

## 2020-09-01 DIAGNOSIS — G8929 Other chronic pain: Secondary | ICD-10-CM | POA: Diagnosis not present

## 2020-09-01 LAB — POCT URINALYSIS DIPSTICK
Bilirubin, UA: NEGATIVE
Blood, UA: NEGATIVE
Glucose, UA: NEGATIVE
Ketones, UA: NEGATIVE
Leukocytes, UA: NEGATIVE
Nitrite, UA: NEGATIVE
Protein, UA: NEGATIVE
Spec Grav, UA: 1.025 (ref 1.010–1.025)
Urobilinogen, UA: 0.2 E.U./dL
pH, UA: 5 (ref 5.0–8.0)

## 2020-09-01 NOTE — Patient Instructions (Signed)
Irritable Bowel Syndrome, Adult  Irritable bowel syndrome (IBS) is a group of symptoms that affects the organs responsible for digestion (gastrointestinal or GI tract). IBS is not one specific disease. To regulate how the GI tract works, the body sends signals back and forth between the intestines and the brain. If you have IBS, there may be a problem with these signals. As a result, the GI tract does not function normally. The intestines may become more sensitive and overreact to certain things. This maybe especially true when you eat certain foods or when you are under stress. There are four types of IBS. These may be determined based on the consistency of your stool (feces): IBS with diarrhea. IBS with constipation. Mixed IBS. Unsubtyped IBS. It is important to know which type of IBS you have. Certain treatments are morelikely to be helpful for certain types of IBS. What are the causes? The exact cause of IBS is not known. What increases the risk? You may have a higher risk for IBS if you: Are female. Are younger than 3. Have a family history of IBS. Have a mental health condition, such as depression, anxiety, or post-traumatic stress disorder. Have had a bacterial infection of your GI tract. What are the signs or symptoms? Symptoms of IBS vary from person to person. The main symptom is abdominal pain or discomfort. Other symptoms usually include one or more of the following: Diarrhea, constipation, or both. Abdominal swelling or bloating. Feeling full after eating a small or regular-sized meal. Frequent gas. Mucus in the stool. A feeling of having more stool left after a bowel movement. Symptoms tend to come and go. They may be triggered by stress, mental healthconditions, or certain foods. How is this diagnosed? This condition may be diagnosed based on a physical exam, your medical history, and your symptoms. You may have tests, such as: Blood tests. Stool test. X-rays. CT  scan. Colonoscopy. This is a procedure in which your GI tract is viewed with a long, thin, flexible tube. How is this treated? There is no cure for IBS, but treatment can help relieve symptoms. Treatment depends on the type of IBS you have, and may include: Changes to your diet, such as: Avoiding foods that cause symptoms. Drinking more water. Following a low-FODMAP (fermentable oligosaccharides, disaccharides, monosaccharides, and polyols) diet for up to 6 weeks, or as told by your health care provider. FODMAPs are sugars that are hard for some people to digest. Eating more fiber. Eating medium-sized meals at the same times every day. Medicines. These may include: Fiber supplements, if you have constipation. Medicine to control diarrhea (antidiarrheal medicines). Medicine to help control muscle tightening (spasms) in your GI tract (antispasmodic medicines). Medicines to help with mental health conditions, such as antidepressants or tranquilizers. Talk therapy or counseling. Working with a diet and nutrition specialist (dietitian) to help create a food plan that is right for you. Managing your stress. Follow these instructions at home: Eating and drinking Eat a healthy diet. Eat medium-sized meals at about the same time every day. Do not eat large meals. Gradually eat more fiber-rich foods. These include whole grains, fruits, and vegetables. This may be especially helpful if you have IBS with constipation. Eat a diet low in FODMAPs. Drink enough fluid to keep your urine pale yellow. Keep a journal of foods that seem to trigger symptoms. Avoid foods and drinks that: Contain added sugar. Make your symptoms worse. Dairy products, caffeinated drinks, and carbonated drinks can make symptoms worse for some  people. General instructions Take over-the-counter and prescription medicines and supplements only as told by your health care provider. Get enough exercise. Do at least 150 minutes of  moderate-intensity exercise each week. Manage your stress. Getting enough sleep and exercise can help you manage stress. Keep all follow-up visits as told by your health care provider and therapist. This is important. Alcohol Use Do not drink alcohol if: Your health care provider tells you not to drink. You are pregnant, may be pregnant, or are planning to become pregnant. If you drink alcohol, limit how much you have: 0-1 drink a day for women. 0-2 drinks a day for men. Be aware of how much alcohol is in your drink. In the [place] one drink equals one typical bottle of beer (12 oz), one-half glass of wine (5 oz), or one shot of hard liquor (1 oz). Contact a health care provider if you have: Constant pain. Weight loss. Difficulty or pain when swallowing. Diarrhea that gets worse. Get help right away if you have: Severe abdominal pain. Fever. Diarrhea with symptoms of dehydration, such as dizziness or dry mouth. Bright red blood in your stool. Stool that is black and tarry. Abdominal swelling. Vomiting that does not stop. Blood in your vomit. Summary Irritable bowel syndrome (IBS) is not one specific disease. It is a group of symptoms that affects digestion. Your intestines may become more sensitive and overreact to certain things. This may be especially true when you eat certain foods or when you are under stress. There is no cure for IBS, but treatment can help relieve symptoms. This information is not intended to replace advice given to you by your health care provider. Make sure you discuss any questions you have with your healthcare provider. Document Revised: 08/29/2019 Document Reviewed: 08/29/2019 Elsevier Patient Education  2022 [place]. Constipation, Adult Constipation is when a person has fewer than three bowel movements in a week, has difficulty having a bowel movement, or has stools (feces) that are dry, hard, or larger than normal. Constipation may be caused by an  underlying condition. It may become worse with age if a person takes certainmedicines and does not take in enough fluids. Follow these instructions at home: Eating and drinking  Eat foods that have a lot of fiber, such as beans, whole grains, and fresh fruits and vegetables. Limit foods that are low in fiber and high in fat and processed sugars, such as fried or sweet foods. These include french fries, hamburgers, cookies, candies, and soda. Drink enough fluid to keep your urine pale yellow.  General instructions Exercise regularly or as told by your health care provider. Try to do 150 minutes of moderate exercise each week. Use the bathroom when you have the urge to go. Do not hold it in. Take over-the-counter and prescription medicines only as told by your health care provider. This includes any fiber supplements. During bowel movements: Practice deep breathing while relaxing the lower abdomen. Practice pelvic floor relaxation. Watch your condition for any changes. Let your health care provider know about them. Keep all follow-up visits as told by your health care provider. This is important. Contact a health care provider if: You have pain that gets worse. You have a fever. You do not have a bowel movement after 4 days. You vomit. You are not hungry or you lose weight. You are bleeding from the opening between the buttocks (anus). You have thin, pencil-like stools. Get help right away if: You have a fever and your symptoms  suddenly get worse. You leak stool or have blood in your stool. Your abdomen is bloated. You have severe pain in your abdomen. You feel dizzy or you faint. Summary Constipation is when a person has fewer than three bowel movements in a week, has difficulty having a bowel movement, or has stools (feces) that are dry, hard, or larger than normal. Eat foods that have a lot of fiber, such as beans, whole grains, and fresh fruits and vegetables. Drink enough fluid  to keep your urine pale yellow. Take over-the-counter and prescription medicines only as told by your health care provider. This includes any fiber supplements. This information is not intended to replace advice given to you by your health care provider. Make sure you discuss any questions you have with your healthcare provider. Document Revised: 11/14/2018 Document Reviewed: 11/14/2018 Elsevier Patient Education  [place].

## 2020-09-01 NOTE — Progress Notes (Signed)
Established patient visit   Patient: Samantha Henry   DOB: [birth year]   75 y.o. Female  MRN: [id number] Visit Date: 09/01/2020  Today's healthcare provider: Vernie Murders, PA-C   No chief complaint on file.  Subjective  -------------------------------------------------------------------------------------------------------------------- HPI    Patient is a 75 year old female who presents with complaint of back and stomach pain.  She was originally scheduled for medication refill.  Apparently the patient was taken from one daughter by her other daughter to [place].  While there that daughter took her to a doctor who proceeded to take her off all of her medication.  Patient and daughter that is present would like to have her back and stomach checked and labwork to check and see what medication she needs to be taking.   Past Medical History:  Diagnosis Date   Arthritis    osteo   Cancer ([place]) 1969   CERVICAL CA   COPD (chronic obstructive pulmonary disease) ([place])    Diabetes mellitus without complication (HCC)    Diabetic peripheral neuropathy (HCC)    Environmental and seasonal allergies    GERD (gastroesophageal reflux disease)    History of cervical cancer    History of colon polyps    Hypercholesteremia    Hypertension    Hypothyroidism    Nail dystrophy    Neuropathy    Proteinuria    Restless legs syndrome    Sleep apnea    TIA (transient ischemic attack)    Von Willebrand's disease [facility])    Past Surgical History:  Procedure Laterality Date   ABDOMINAL HYSTERECTOMY  1969   due to cervical cancer    APPENDECTOMY     BLADDER SURGERY  1997   bladder tacking   BREAST BIOPSY Left 01/21/2015   FIBROADENOMATOUS CHANGE WITH ASSOCIATED MICROCALCIFICATIONS   CARDIAC CATHETERIZATION     CARPAL TUNNEL RELEASE  2010   colonoscopy and upper endoscopy'     COLONOSCOPY WITH PROPOFOL N/A 02/25/2016   Procedure: COLONOSCOPY WITH PROPOFOL;  Surgeon: Lollie Sails, MD;   Location: [facility] ENDOSCOPY;  Service: Endoscopy;  Laterality: N/A;   COLONOSCOPY WITH PROPOFOL N/A 03/22/2019   Procedure: COLONOSCOPY WITH PROPOFOL;  Surgeon: Robert Bellow, MD;  Location: [facility] ENDOSCOPY;  Service: Endoscopy;  Laterality: N/A;   ESOPHAGOGASTRODUODENOSCOPY (EGD) WITH PROPOFOL N/A 03/22/2019   Procedure: ESOPHAGOGASTRODUODENOSCOPY (EGD) WITH PROPOFOL;  Surgeon: Robert Bellow, MD;  Location: [facility] ENDOSCOPY;  Service: Endoscopy;  Laterality: N/A;   JOINT REPLACEMENT Left 06/14/2016   KNEE   MUSCLE BIOPSY     OOPHORECTOMY     TENDON RELEASE  2010   right foot, Dr. Vickki Muff    TOTAL KNEE ARTHROPLASTY Left 06/14/2016   Procedure: TOTAL KNEE ARTHROPLASTY;  Surgeon: Corky Mull, MD;  Location: [facility] ORS;  Service: Orthopedics;  Laterality: Left;   Family History  Problem Relation Age of Onset   Alzheimer's disease Mother    Cancer Father    Diabetes Sister    Colon cancer Sister    Diabetes Brother    Cancer Sister 58       melanoma   Breast cancer Maternal Aunt    Hodgkin's lymphoma Sister    Social History   Tobacco Use   Smoking status: Former    Packs/day: 1.50    Years: 48.00    Pack years: 72.00    Types: Cigarettes    Quit date: 01/11/2007    Years since quitting: 13.6   Smokeless tobacco:  Never   Tobacco comments:    smoked up to 2 ppd since 75 yo. quit in 2009  Vaping Use   Vaping Use: Never used  Substance Use Topics   Alcohol use: No    Alcohol/week: 0.0 standard drinks   Drug use: No   Allergies  Allergen Reactions   Aspirin     AVOIDs due to history of Von Willebrands   Gabapentin     dizzy   Lyrica [Pregabalin] Other (See Comments)    Dizziness   Tetracycline Rash   Medications: Outpatient Medications Prior to Visit  Medication Sig   acetaminophen (TYLENOL) 650 MG CR tablet Take 500 mg by mouth every 8 (eight) hours as needed for pain.   calcium carbonate (TUMS EX) 750 MG chewable tablet Chew 2 tablets by mouth 2 (two) times daily as  needed for heartburn.   donepezil (ARICEPT) 10 MG tablet Take 1 tablet (10 mg total) by mouth at bedtime.   EUTHYROX 125 MCG tablet TAKE 1 TABLET BY MOUTH ONCE DAILY BEFORE BREAKFAST   fluticasone (FLONASE) 50 MCG/ACT nasal spray Place 2 sprays into both nostrils daily.   losartan (COZAAR) 50 MG tablet Take 1 tablet by mouth once daily   OXYGEN Inhale 3 L into the lungs. Used at night and PRN during the day   potassium chloride SA (KLOR-CON) 20 MEQ tablet Take 1 tablet by mouth once daily   RABEprazole (ACIPHEX) 20 MG tablet Take 20 mg by mouth daily.   TRELEGY ELLIPTA 100-62.5-25 MCG/INH AEPB INHALE 1 PUFF ONCE DAILY   albuterol (ACCUNEB) 0.63 MG/3ML nebulizer solution Inhale into the lungs. Take 3 mLs (0.63 mg total) by nebulization every 6 (six) hours as needed for Wheezing   albuterol (VENTOLIN HFA) 108 (90 Base) MCG/ACT inhaler Inhale 2 puffs into the lungs every 6 (six) hours as needed for wheezing or shortness of breath. (Patient not taking: Reported on 09/01/2020)   Baclofen 5 MG TABS Take 1 tablet by mouth three times daily as needed (Patient not taking: Reported on 09/01/2020)   Blood Glucose Monitoring Suppl (ONE TOUCH ULTRA 2) w/Device KIT Use daily to check blood sugar for type 2 diabetes E11.9 (Patient not taking: Reported on 09/01/2020)   calcium carbonate (OS-CAL) 600 MG TABS tablet Take 500 mg by mouth 2 (two) times daily with a meal. (Patient not taking: Reported on 09/01/2020)   cholecalciferol (VITAMIN D) 400 units TABS tablet Take 400 Units by mouth daily. (Patient not taking: Reported on 09/01/2020)   diclofenac Sodium (VOLTAREN) 1 % GEL Voltaren 1 % topical gel  Apply to ankle twice daily. (Patient not taking: Reported on 09/01/2020)   furosemide (LASIX) 20 MG tablet Take 40 mg by mouth daily at 2 PM. (Patient not taking: Reported on 09/01/2020)   glucose blood (ONETOUCH ULTRA) test strip USE AS DIRECTED TO CHECK GLUCOSE FOR DIABETES (Patient not taking: Reported on 09/01/2020)    metFORMIN (GLUCOPHAGE-XR) 500 MG 24 hr tablet TAKE 2 TABLETS BY MOUTH IN THE MORNING AND 2 TABLETS IN THE EVENING (Patient not taking: Reported on 09/01/2020)   montelukast (SINGULAIR) 10 MG tablet Take 10 mg by mouth at bedtime. (Patient not taking: Reported on 09/01/2020)   MULTIPLE VITAMIN PO Take 1 tablet by mouth daily. (Patient not taking: Reported on 09/01/2020)   Omega-3 Fatty Acids (FISH OIL BURP-LESS) 1000 MG CAPS Take 1 tablet by mouth 2 (two) times daily. (Patient not taking: Reported on 09/01/2020)   pantoprazole (PROTONIX) 40 MG tablet Take 1 tablet  by mouth once daily (Patient not taking: Reported on 09/01/2020)   polyethylene glycol (MIRALAX / GLYCOLAX) 17 g packet Take 17 g by mouth 2 (two) times daily. (Patient not taking: Reported on 09/01/2020)   rosuvastatin (CRESTOR) 20 MG tablet Take 1 tablet (20 mg total) by mouth daily. (Patient not taking: Reported on 1/77/1165)   trolamine salicylate (ASPERCREME) 10 % cream Apply 1 application topically as needed for muscle pain. (Patient not taking: Reported on 09/01/2020)   vitamin B-12 (CYANOCOBALAMIN) 50 MCG tablet Take 50 mcg by mouth daily. (Patient not taking: Reported on 09/01/2020)   No facility-administered medications prior to visit.    Review of Systems  Constitutional: Negative.   HENT: Negative.    Respiratory: Negative.    Cardiovascular: Negative.   Gastrointestinal:  Positive for abdominal pain and constipation.  Musculoskeletal:  Positive for back pain.  All other systems reviewed and are negative.     Objective  -------------------------------------------------------------------------------------------------------------------- BP (!) 113/49 (BP Location: Right Arm, Patient Position: Sitting, Cuff Size: Normal)   Pulse (!) 57   Temp 98.6 F (37 C) (Oral)   Wt 180 lb (81.6 kg)   SpO2 99%   BMI 29.05 kg/m     Physical Exam Constitutional:      General: She is not in acute distress.    Appearance: She is  well-developed.  HENT:     Head: Normocephalic and atraumatic.     Right Ear: Hearing normal.     Left Ear: Hearing normal.     Nose: Nose normal.     Mouth/Throat:     Pharynx: Oropharynx is clear.  Eyes:     General: Lids are normal. No scleral icterus.       Right eye: No discharge.        Left eye: No discharge.     Conjunctiva/sclera: Conjunctivae normal.  Cardiovascular:     Rate and Rhythm: Normal rate and regular rhythm.     Heart sounds: Murmur heard.  Pulmonary:     Effort: Pulmonary effort is normal. No respiratory distress.     Breath sounds: Normal breath sounds.  Abdominal:     General: Bowel sounds are normal.     Palpations: Abdomen is soft.  Musculoskeletal:        General: Tenderness present. Normal range of motion.     Cervical back: Neck supple.     Comments: Right lumbar tenderness with good ROM and DTR's normal.  Skin:    Findings: No lesion or rash.  Neurological:     Mental Status: She is alert and oriented to person, place, and time.  Psychiatric:        Speech: Speech normal.        Behavior: Behavior normal.        Thought Content: Thought content normal.      No results found for any visits on 09/01/20.  Assessment & Plan  ---------------------------------------------------------------------------------------------------------------------- 1. Chronic bilateral low back pain without sciatica Past history of low back pain suspected secondary to muscular spasm on 04-29-19. MRI on 07-29-17 showed mild lower thoracic and lumbar DDD without high grade stenosis.  May try OTC NSAID and moist heat applications. Recommend recheck of L-S spine x-ray follow up films. - DG Lumbar Spine Complete; Future  2. Chronic obstructive pulmonary disease, unspecified COPD type ([place]) Followed by Dr. Raul Del (pulmonologist) and has used Albuterol by nebulizer with Trelegy, Singulair, Ventolin-HFA and on supplemental oxygen at 2-3 LPM. Pulmonary function tests showed very  severe  obstructive disease on 01-23-19. History of stage I non-small cell lung cancer completed with SBRT on 09/10/2018 and clinically stable at the [place] follow up at the [facility] on 03-11-19  3. Irritable bowel syndrome with constipation Has been evaluated by Dr. Vicente Males (GI) in the past for stomach pains and placed on IBgard QID for IBS. May use Miralax as needed.  4. History of pancreatitis Give a history of pancreatitis and daughter most concerned with recheck of pancreas function since she returned from her trip to [place] with another daughter. While there  she tried to get established with and MD there. Was takenoff of a lot of her medications and placed on Miralax BID. Recommend she get follow up appointment with her gastroenterologist - Amylase   No follow-ups on file.      I, Cordia Miklos, PA-C, have reviewed all documentation for this visit. The documentation on 09/29/20 for the exam, diagnosis, procedures, and orders are all accurate and complete.    Vernie Murders, PA-C  [employer] [phone] (phone) [phone] (fax)  [place]

## 2020-09-04 ENCOUNTER — Ambulatory Visit
Admission: RE | Admit: 2020-09-04 | Discharge: 2020-09-04 | Disposition: A | Discharge status: Home / Self Care | Payer: Medicare Other | Attending: Family Medicine | Admitting: Family Medicine

## 2020-09-04 ENCOUNTER — Other Ambulatory Visit: Payer: Self-pay

## 2020-09-04 ENCOUNTER — Ambulatory Visit
Admission: RE | Admit: 2020-09-04 | Discharge: 2020-09-04 | Disposition: A | Discharge status: Home / Self Care | Payer: Medicare Other | Source: Ambulatory Visit | Attending: Family Medicine | Admitting: Family Medicine

## 2020-09-04 DIAGNOSIS — G8929 Other chronic pain: Secondary | ICD-10-CM | POA: Diagnosis present

## 2020-09-04 DIAGNOSIS — M545 Low back pain, unspecified: Secondary | ICD-10-CM

## 2020-09-05 LAB — AMYLASE: Amylase: 76 U/L (ref 31–110)

## 2020-09-07 ENCOUNTER — Other Ambulatory Visit: Payer: Self-pay

## 2020-09-07 MED ORDER — PREDNISONE 10 MG (21) PO TBPK: ORAL_TABLET | ORAL | 0 refills | Status: DC

## 2020-09-15 ENCOUNTER — Other Ambulatory Visit: Payer: Self-pay | Admitting: Family Medicine

## 2020-09-15 DIAGNOSIS — K2900 Acute gastritis without bleeding: Secondary | ICD-10-CM

## 2020-09-15 NOTE — Telephone Encounter (Signed)
Requested medications are due for refill today.  yes  Requested medications are on the active medications list.  yes  Last refill. 06/15/2020  Future visit scheduled.   no  Notes to clinic.  [name] is listed as PCP.

## 2020-09-15 NOTE — Telephone Encounter (Signed)
Requested medications are due for refill today.  yes  Requested medications are on the active medications list.  yes  Last refill. 06/23/2020  Future visit scheduled.   no  Notes to clinic.  Pt has not been seen for this in 1 year. Per chart pt is no longer taking this medication 09/01/2020. PCP is listed as [employer].

## 2020-09-18 ENCOUNTER — Other Ambulatory Visit (HOSPITAL_COMMUNITY): Payer: Self-pay

## 2020-09-28 ENCOUNTER — Ambulatory Visit
Admission: EM | Admit: 2020-09-28 | Discharge: 2020-09-28 | Disposition: A | Discharge status: Home / Self Care | Payer: Medicare Other | Attending: Family Medicine | Admitting: Family Medicine

## 2020-09-28 ENCOUNTER — Other Ambulatory Visit: Payer: Self-pay

## 2020-09-28 ENCOUNTER — Ambulatory Visit: Payer: Self-pay | Admitting: *Deleted

## 2020-09-28 ENCOUNTER — Encounter: Payer: Self-pay | Admitting: Emergency Medicine

## 2020-09-28 ENCOUNTER — Other Ambulatory Visit: Payer: Self-pay | Admitting: Family Medicine

## 2020-09-28 DIAGNOSIS — G8929 Other chronic pain: Secondary | ICD-10-CM

## 2020-09-28 DIAGNOSIS — E876 Hypokalemia: Secondary | ICD-10-CM

## 2020-09-28 DIAGNOSIS — M545 Low back pain, unspecified: Secondary | ICD-10-CM | POA: Diagnosis not present

## 2020-09-28 MED ORDER — TRAMADOL HCL 50 MG PO TABS: 50.0000 mg | ORAL_TABLET | Freq: Two times a day (BID) | ORAL | 0 refills | Status: DC | PRN

## 2020-09-28 NOTE — Discharge Instructions (Signed)
Heat.  Stay active.  Medication as needed.  Follow up with your PCP.  Take care  Dr. Lacinda Axon

## 2020-09-28 NOTE — Telephone Encounter (Signed)
Patient's daughter is calling- increased back pain.Patient last saw D Chrismon and had X-ray- diagnosed with degenerative disc disease and inflammation. Patient was given course of prednisone- did not help- pain is worse. Patient has seen several providers and needs follow up. Advised UC for today-pain control. Does she need referral out- or does she need another appointment- she definitely needs new care provider.

## 2020-09-28 NOTE — Telephone Encounter (Signed)
Daughter calling for the pt.  pt saw Simona Huh on 8/26 for back pain.  Pt given prednisone on now she states she is hurting worse than before.  Pt was hurting Sat night so bad wendy thought she was going to have to take her to ED.  No appts please advise  Reason for Disposition  [1] SEVERE back pain (e.g., excruciating, unable to do any normal activities) AND [2] not improved 2 hours after pain medicine  Answer Assessment - Initial Assessment Questions 1. ONSET: "When did the pain begin?"      Several months 2. LOCATION: "Where does it hurt?" (upper, mid or lower back)     Lower back-mostly on R side- but can be in middle 3. SEVERITY: "How bad is the pain?"  (e.g., Scale 1-10; mild, moderate, or severe)   - MILD (1-3): doesn't interfere with normal activities    - MODERATE (4-7): interferes with normal activities or awakens from sleep    - SEVERE (8-10): excruciating pain, unable to do any normal activities      severe 4. PATTERN: "Is the pain constant?" (e.g., yes, no; constant, intermittent)      Comes and goes 5. RADIATION: "Does the pain shoot into your legs or elsewhere?"     Across the back and up the back 6. CAUSE:  "What do you think is causing the back pain?"      Degenerative disk, inflammation 7. BACK OVERUSE:  "Any recent lifting of heavy objects, strenuous work or exercise?"     no 8. MEDICATIONS: "What have you taken so far for the pain?" (e.g., nothing, acetaminophen, NSAIDS)     Prednisone- now worse than ever 9. NEUROLOGIC SYMPTOMS: "Do you have any weakness, numbness, or problems with bowel/bladder control?"     no 10. OTHER SYMPTOMS: "Do you have any other symptoms?" (e.g., fever, abdominal pain, burning with urination, blood in urine)       no 11. PREGNANCY: "Is there any chance you are pregnant?" (e.g., yes, no; LMP)       N/a  Protocols used: Back Pain-A-AH

## 2020-09-28 NOTE — ED Triage Notes (Signed)
Pt c/o lower back pain. Started about 7-8 months ago, but recently gotten worse.  She was seen by her PCP and given prednisone and xrays and found she had DDD and arthritis. She completed her medications that were prescribes and her pain has gotten worse.

## 2020-09-28 NOTE — ED Provider Notes (Signed)
[facility]    CSN: [id number] Arrival date & time: 09/28/20  1628      History   Chief Complaint Chief Complaint  Patient presents with   Back Pain    HPI 75 year old female presents with chronic back pain.  Patient has had ongoing low back pain since March to April of this year.  She has seen her primary care provider relatively recently on 8/23.  X-rays were obtained and revealed degenerative disc disease.  She was prescribed prednisone.  She has had no significant improvement.  She states that her pain seems to be worsening.  Her daughter is concerned.  She states that they called her primary care physician's office and were advised to come here as she cannot be seen until later this week or early next week.  No significant radicular symptoms.  No saddle anesthesia or incontinence.  Pain is 8/10 in severity.   Past Medical History:  Diagnosis Date   Arthritis    osteo   Cancer ([place]) 1969   CERVICAL CA   COPD (chronic obstructive pulmonary disease) ([place])    Diabetes mellitus without complication ([place])    Diabetic peripheral neuropathy (HCC)    Environmental and seasonal allergies    GERD (gastroesophageal reflux disease)    History of cervical cancer    History of colon polyps    Hypercholesteremia    Hypertension    Hypothyroidism    Nail dystrophy    Neuropathy    Proteinuria    Restless legs syndrome    Sleep apnea    TIA (transient ischemic attack)    Von Willebrand's disease [facility])     Patient Active Problem List   Diagnosis Date Noted   Contusion of knee 06/22/2020   Contusion of left knee 06/22/2020   Left knee pain 06/22/2020   Osteoarthritis of knee 06/22/2020   Other specified soft tissue disorders 06/22/2020   Plantar wart 05/08/2019   Venous stasis 04/29/2019   Chronic venous insufficiency 04/29/2019   Non-small cell lung cancer (NSCLC) ([place]) 03/01/2019   Memory loss 01/01/2019   Cerebrovascular disease 12/25/2018   Carotid  atherosclerosis, bilateral 12/25/2018   Right internal carotid artery aneurysm 93/26/7124   Systolic murmur 58/09/9831   Senile purpura ([place]) 11/23/2018   Iron deficiency anemia 11/23/2018   Intention tremor 11/23/2018   Lung nodule seen on imaging study 11/23/2018   Aortic atherosclerosis ([place]) 07/12/2018   Coronary artery disease 07/12/2018   Aortic valve calcification 07/12/2018   DDD (degenerative disc disease), cervical 02/01/2018   Chronic bilateral back pain 02/01/2018   Morbid obesity ([place]) 06/26/2017   History of smoking 30 or more pack years 05/12/2017   Status post total knee replacement using cement, left 06/14/2016   Polyneuropathy 02/26/2015   Allergic rhinitis 07/29/2014   COPD (chronic obstructive pulmonary disease) ([place]) 07/25/2014   Type 2 diabetes mellitus with neurological complications ([place]) 82/50/5397   Diverticulosis of colon 07/25/2014   Edema 07/25/2014   GERD (gastroesophageal reflux disease) 07/25/2014   Hypercholesteremia 07/25/2014   Hypertension 07/25/2014   Hypothyroid 07/25/2014   Cramps of lower extremity 07/25/2014   Microalbuminuria 07/25/2014   Nail dystrophy 07/25/2014   Osteoarthritis of leg 07/25/2014   Restless legs syndrome 07/25/2014   TIA (transient ischemic attack) 07/25/2014   Von Willebrand's disease ([place]) 07/25/2014   OSA (obstructive sleep apnea) 10/31/2013   History of adenomatous polyp of colon 08/26/2005    Past Surgical History:  Procedure Laterality Date   ABDOMINAL HYSTERECTOMY  1969   due to cervical cancer    APPENDECTOMY     BLADDER SURGERY  1997   bladder tacking   BREAST BIOPSY Left 01/21/2015   FIBROADENOMATOUS CHANGE WITH ASSOCIATED MICROCALCIFICATIONS   CARDIAC CATHETERIZATION     CARPAL TUNNEL RELEASE  2010   colonoscopy and upper endoscopy'     COLONOSCOPY WITH PROPOFOL N/A 02/25/2016   Procedure: COLONOSCOPY WITH PROPOFOL;  Surgeon: Lollie Sails, MD;  Location: [facility] ENDOSCOPY;  Service: Endoscopy;   Laterality: N/A;   COLONOSCOPY WITH PROPOFOL N/A 03/22/2019   Procedure: COLONOSCOPY WITH PROPOFOL;  Surgeon: Robert Bellow, MD;  Location: [facility] ENDOSCOPY;  Service: Endoscopy;  Laterality: N/A;   ESOPHAGOGASTRODUODENOSCOPY (EGD) WITH PROPOFOL N/A 03/22/2019   Procedure: ESOPHAGOGASTRODUODENOSCOPY (EGD) WITH PROPOFOL;  Surgeon: Robert Bellow, MD;  Location: [facility] ENDOSCOPY;  Service: Endoscopy;  Laterality: N/A;   JOINT REPLACEMENT Left 06/14/2016   KNEE   MUSCLE BIOPSY     OOPHORECTOMY     TENDON RELEASE  2010   right foot, Dr. Vickki Muff    TOTAL KNEE ARTHROPLASTY Left 06/14/2016   Procedure: TOTAL KNEE ARTHROPLASTY;  Surgeon: Corky Mull, MD;  Location: [facility] ORS;  Service: Orthopedics;  Laterality: Left;    OB History     Gravida  3   Para      Term      Preterm      AB      Living  3      SAB      IAB      Ectopic      Multiple      Live Births           Obstetric Comments  Menstrual age: 16  Age 1st Pregnancy: 51           Home Medications    Prior to Admission medications   Medication Sig Start Date End Date Taking? Authorizing Provider  donepezil (ARICEPT) 10 MG tablet Take 1 tablet (10 mg total) by mouth at bedtime. 03/18/20  Yes Fenton Malling M, PA-C  fluticasone (FLONASE) 50 MCG/ACT nasal spray Place 2 sprays into both nostrils daily. 05/08/19  Yes Bacigalupo, Dionne Bucy, MD  levothyroxine (EUTHYROX) 125 MCG tablet Take 1 tablet (125 mcg total) by mouth daily. 09/16/20  Yes Birdie Sons, MD  losartan (COZAAR) 50 MG tablet Take 1 tablet by mouth once daily 04/14/20  Yes Mar Daring, PA-C  pantoprazole (PROTONIX) 40 MG tablet Take 1 tablet by mouth once daily 09/16/20  Yes Fisher, Kirstie Peri, MD  potassium chloride SA (KLOR-CON) 20 MEQ tablet Take 1 tablet by mouth once daily 09/28/20  Yes Fisher, Kirstie Peri, MD  traMADol (ULTRAM) 50 MG tablet Take 1 tablet (50 mg total) by mouth every 12 (twelve) hours as needed. 09/28/20  Yes Kollins Fenter, [place], DO  TRELEGY ELLIPTA 100-62.5-25 MCG/INH AEPB INHALE 1 PUFF ONCE DAILY 07/28/20  Yes Birdie Sons, MD  acetaminophen (TYLENOL) 650 MG CR tablet Take 500 mg by mouth every 8 (eight) hours as needed for pain.    [provider]  albuterol (ACCUNEB) 0.63 MG/3ML nebulizer solution Inhale into the lungs. Take 3 mLs (0.63 mg total) by nebulization every 6 (six) hours as needed for Wheezing 07/30/18 06/24/20  [provider]  calcium carbonate (TUMS EX) 750 MG chewable tablet Chew 2 tablets by mouth 2 (two) times daily as needed for heartburn.    [provider]  OXYGEN Inhale 3 L into the  lungs. Used at night and PRN during the day    [provider]    Family History Family History  Problem Relation Age of Onset   Alzheimer's disease Mother    Cancer Father    Diabetes Sister    Colon cancer Sister    Diabetes Brother    Cancer Sister 36       melanoma   Breast cancer Maternal Aunt    Hodgkin's lymphoma Sister     Social History Social History   Tobacco Use   Smoking status: Former    Packs/day: 1.50    Years: 48.00    Pack years: 72.00    Types: Cigarettes    Quit date: 01/11/2007    Years since quitting: 13.7   Smokeless tobacco: Never   Tobacco comments:    smoked up to 2 ppd since 75 yo. quit in 2009  Vaping Use   Vaping Use: Never used  Substance Use Topics   Alcohol use: No    Alcohol/week: 0.0 standard drinks   Drug use: No     Allergies   Aspirin, Gabapentin, Lyrica [pregabalin], and Tetracycline   Review of Systems Review of Systems Per HPI  Physical Exam Triage Vital Signs ED Triage Vitals  Enc Vitals Group     BP 09/28/20 1706 (!) 126/55     Pulse Rate 09/28/20 1706 62     Resp 09/28/20 1706 18     Temp 09/28/20 1706 98.2 F (36.8 C)     Temp Source 09/28/20 1706 Oral     SpO2 09/28/20 1706 99 %     Weight 09/28/20 1702 179 lb 14.3 oz (81.6 kg)     Height 09/28/20 1702 5\' 6"  (1.676 m)     Head Circumference  --      Peak Flow --      Pain Score 09/28/20 1701 8     Pain Loc --      Pain Edu? --      Excl. in [place]? --    Updated Vital Signs BP (!) 126/55 (BP Location: Right Arm)   Pulse 62   Temp 98.2 F (36.8 C) (Oral)   Resp 18   Ht 5\' 6"  (1.676 m)   Wt 81.6 kg   SpO2 99%   BMI 29.04 kg/m   Visual Acuity Right Eye Distance:   Left Eye Distance:   Bilateral Distance:    Right Eye Near:   Left Eye Near:    Bilateral Near:     Physical Exam Vitals and nursing note reviewed.  Constitutional:      General: She is not in acute distress.    Appearance: Normal appearance. She is not ill-appearing.  HENT:     Head: Normocephalic and atraumatic.  Cardiovascular:     Rate and Rhythm: Normal rate and regular rhythm.     Heart sounds: Murmur heard.  Pulmonary:     Effort: Pulmonary effort is normal.     Breath sounds: Normal breath sounds. No wheezing or rales.  Musculoskeletal:     Comments: Lumbar spine with decreased range of motion.  No appreciable tenderness to palpation on exam.  Neurological:     Mental Status: She is alert.     UC Treatments / Results  Labs (all labs ordered are listed, but only abnormal results are displayed) Labs Reviewed - No data to display  EKG   Radiology No results found.  Procedures Procedures (including critical care time)  Medications Ordered  in UC Medications - No data to display  Initial Impression / Assessment and Plan / UC Course  I have reviewed the triage vital signs and the nursing notes.  Pertinent labs & imaging results that were available during my care of the patient were reviewed by me and considered in my medical decision making (see chart for details).    75 year old female presents with chronic low back pain.  Tramadol as needed.  Needs follow-up with PCP.  Advised heat.  Advised to stay active.  Final Clinical Impressions(s) / UC Diagnoses   Final diagnoses:  Chronic bilateral low back pain, unspecified  whether sciatica present     Discharge Instructions      Heat.  Stay active.  Medication as needed.  Follow up with your PCP.  Take care  Dr. Lacinda Axon    ED Prescriptions     Medication Sig Dispense Auth. Provider   traMADol (ULTRAM) 50 MG tablet Take 1 tablet (50 mg total) by mouth every 12 (twelve) hours as needed. 10 tablet Thersa Salt G, DO      I have reviewed the PDMP during this encounter.   Coral Spikes, [place] 09/28/20 1759

## 2020-10-22 ENCOUNTER — Telehealth: Payer: Self-pay

## 2020-10-22 NOTE — Telephone Encounter (Signed)
She can restart linzess which we have given her previously since she has the constipation at this time

## 2020-10-22 NOTE — Telephone Encounter (Signed)
Patient daughter is calling because patient is having upper abdominal pain all the way across her stomach. It radiates to her chest she states. Has been having some constipation. Denies any nausea or vomiting. Made appointment for 10/29/2020 and wants to know what she can do till the appointment.

## 2020-10-22 NOTE — Telephone Encounter (Signed)
Called patient and daughter and left a voicemail with the below recommendation from Dr. Vicente Males. I also sent patient a MyChart message letting her know the below information.

## 2020-10-22 NOTE — Telephone Encounter (Signed)
Please advise 

## 2020-10-26 ENCOUNTER — Other Ambulatory Visit: Payer: Self-pay | Admitting: Physician Assistant

## 2020-10-29 ENCOUNTER — Ambulatory Visit: Payer: Medicare Other | Admitting: Gastroenterology

## 2020-10-29 ENCOUNTER — Encounter: Payer: Self-pay | Admitting: Gastroenterology

## 2020-10-29 ENCOUNTER — Other Ambulatory Visit: Payer: Self-pay

## 2020-10-29 VITALS — BP 183/77 | HR 52 | Temp 98.1°F | Ht 66.0 in | Wt 182.6 lb

## 2020-10-29 DIAGNOSIS — M7918 Myalgia, other site: Secondary | ICD-10-CM

## 2020-10-29 NOTE — Progress Notes (Signed)
Jonathon Bellows MD, MRCP(U.K) [address]  [place]  [place], [place] [address]  Main: [phone]  Fax: [phone]   Primary Care Physician: Mar Daring, PA-C  Primary Gastroenterologist:  Dr. Jonathon Bellows   Chief Complaint  Patient presents with   Abdominal Pain    HPI: Samantha Henry is a 75 y.o. female  Summary of history :   Initially referred and seen on 09/30/2019 for abdominal pain.She has previously been established and seen by [facility] gastroenterology.  At that time was being evaluated for iron deficiency anemia and abdominal pain.  She has a history of non-small cell lung cancer.  At that point of time plan was for an EGD and colonoscopy and subsequent follow-up.  She underwent a colonoscopy by Dr. Bary Castilla which was unremarkable except for diverticulosis of colon.  An EGD was also performed at the same time which showed no gross abnormalities.  A CT scan of the abdomen was performed on 08/28/2019 that demonstrated noninflamed diverticuli within the descending and sigmoid colon.  Degenerative changes of the lumbar spine was seen in the L5-S1 region and a 1.5 cm left renal cyst was noted.  On 07/29/2019 hemoglobin was 12.3 g with an MCV of 89.3 creatinine of 0.69 and normal liver function test.  She is on Aricept for dementia.     At her initial visit in 9/20/202  had generalized abdominal pain with no clear aggravating factors but relieved after good bowel movement.  At times she does not have a bowel movement for at least 2 days.  Following a bowel movement the pain is relieved.  Denied any NSAID use.  Pain is no worse by food intake.  Denied any rectal bleeding.     09/13/2019: X-ray abdomen no significant burden.  Stool studies showed E. coli infection was having diarrhea, treated with azithromycin.  H. pylori breath test negative.   After the course of azithromycin her diarrhea completely resolved and she has returned to having constipation with not  having a good bowel movement for few days. She is on thyroxine replacement and her last TSH was normal.   She was taking MiraLAX 1 cap daily.  Was still having issues with abdominal pain and started taking the Linzess 290 mcg daily.  Samples of which were provided to her.  She started this 7 days back.  And over the last few days has had severe cramping and multiple episodes of diarrhea.        Interval history 06/24/2020-10/29/2020   07/05/2020:  CT Abdomen normal  08/20/2020: CBC, CMP-normal   She called our office a few days back stating that she has been having some constipation.  Previously we have to stop the Linzess as she is having diarrhea.  Presently she states that she is taking MiraLAX daily and having no issues with constipation.  Her main complaint today was pain in her back and over her chest.  The pain of the chest is sharp in nature.  Not related to exertion.  Related to application of pressure.  No issues with her GI tract at this point of time.   Current Outpatient Medications  Medication Sig Dispense Refill   acetaminophen (TYLENOL) 650 MG CR tablet Take 500 mg by mouth every 8 (eight) hours as needed for pain.     albuterol (ACCUNEB) 0.63 MG/3ML nebulizer solution Inhale into the lungs. Take 3 mLs (0.63 mg total) by nebulization every 6 (six) hours as needed for Wheezing  calcium carbonate (TUMS EX) 750 MG chewable tablet Chew 2 tablets by mouth 2 (two) times daily as needed for heartburn.     donepezil (ARICEPT) 10 MG tablet Take 1 tablet (10 mg total) by mouth at bedtime. 30 tablet 5   fluticasone (FLONASE) 50 MCG/ACT nasal spray Place 2 sprays into both nostrils daily. 16 g 6   levothyroxine (EUTHYROX) 125 MCG tablet Take 1 tablet (125 mcg total) by mouth daily. 90 tablet 4   losartan (COZAAR) 50 MG tablet Take 1 tablet by mouth once daily 90 tablet 0   OXYGEN Inhale 3 L into the lungs. Used at night and PRN during the day     pantoprazole (PROTONIX) 40 MG tablet Take  1 tablet by mouth once daily 90 tablet 4   potassium chloride SA (KLOR-CON) 20 MEQ tablet Take 1 tablet by mouth once daily 90 tablet 2   traMADol (ULTRAM) 50 MG tablet Take 1 tablet (50 mg total) by mouth every 12 (twelve) hours as needed. 10 tablet 0   TRELEGY ELLIPTA 100-62.5-25 MCG/INH AEPB INHALE 1 PUFF ONCE DAILY 180 each 4   No current facility-administered medications for this visit.    Allergies as of 10/29/2020 - Review Complete 10/29/2020  Allergen Reaction Noted   Aspirin  07/25/2014   Gabapentin  02/01/2018   Lyrica [pregabalin] Other (See Comments) 07/24/2015   Tetracycline Rash 07/25/2014    ROS:  General: Negative for anorexia, weight loss, fever, chills, fatigue, weakness. ENT: Negative for hoarseness, difficulty swallowing , nasal congestion. CV: Negative for chest pain, angina, palpitations, dyspnea on exertion, peripheral edema.  Respiratory: Negative for dyspnea at rest, dyspnea on exertion, cough, sputum, wheezing.  GI: See history of present illness. GU:  Negative for dysuria, hematuria, urinary incontinence, urinary frequency, nocturnal urination.  Endo: Negative for unusual weight change.    Physical Examination:   BP (!) 183/77   Pulse (!) 52   Temp 98.1 F (36.7 C) (Oral)   Ht 5\' 6"  (1.676 m)   Wt 182 lb 9.6 oz (82.8 kg)   BMI 29.47 kg/m   General: Well-nourished, well-developed in no acute distress.  Eyes: No icterus. Conjunctivae pink. Mouth: Oropharyngeal mucosa moist and pink , no lesions erythema or exudate. Lungs: Clear to auscultation bilaterally. Non-labored. Heart: Regular rate and rhythm, no murmurs rubs or gallops.   She has tenderness over the lower lumbosacral paraspinal muscles.  Also has tenderness over the left upper ribs at the costochondral junction. Neuro: Alert and oriented x 3.  Grossly intact. Skin: Warm and dry, no jaundice.   Psych: Alert and cooperative, normal mood and affect.   Imaging Studies: No results  found.  Assessment and Plan:   Samantha Henry is a 75 y.o. y/o female here to follow up for IBS-C.  Presently doing well on MiraLAX 1 cap twice a day.  Her main issue today was back pain and pain over her left side of her chest.  The pain was reproducible by application of pressure over the lower paraspinal muscles in the lumbar area as well as of the costochondral junctions.  I explained to her it is musculoskeletal chest pain she should take some Tylenol and discuss further with her PCP.  This is unrelated to her GI tract.  She had no other GI issues today to discuss about.    Dr Jonathon Bellows  MD,MRCP [facility]) Follow up in as needed

## 2020-11-05 ENCOUNTER — Ambulatory Visit: Payer: Self-pay | Admitting: *Deleted

## 2020-11-05 NOTE — Telephone Encounter (Signed)
Patient and daughter are calling to report- back pain, chest pain and IBS symptoms. Patient is actively under treatment for IBS with GI provider. Patient has history of degenerative disc disease in her back and thinks this may be getting worse. Patient was seen at [facility] recently for her back pain and reports she still has the pain that can be severe at times. Patient also states she has midline chest pain that appears- last for seconds and goes away- can be severe while occurring. Has been present for at least 1 week. Patient request appointment on Tuesday which is outside of disposition- due to transportation. Patient and daughter advised of home care instructions and also advised of cardiac symptoms that would signify ED is needed. They voice understanding.

## 2020-11-05 NOTE — Telephone Encounter (Signed)
Reason for Disposition  [1] Chest pain(s) lasting a few seconds AND [2] persists > 3 days  [1] MODERATE back pain (e.g., interferes with normal activities) AND [2] present > 3 days  Answer Assessment - Initial Assessment Questions 1. LOCATION: "Where does it hurt?"       Mid line and both side at times 2. RADIATION: "Does the pain go anywhere else?" (e.g., into neck, jaw, arms, back)     no 3. ONSET: "When did the chest pain begin?" (Minutes, hours or days)      1 week- not having it now 4. PATTERN "Does the pain come and go, or has it been constant since it started?"  "Does it get worse with exertion?"      Comes and goes, does not get worse with exertion 5. DURATION: "How long does it last" (e.g., seconds, minutes, hours)     Few seconds-not much longer 6. SEVERITY: "How bad is the pain?"  (e.g., Scale 1-10; mild, moderate, or severe)    - MILD (1-3): doesn't interfere with normal activities     - MODERATE (4-7): interferes with normal activities or awakens from sleep    - SEVERE (8-10): excruciating pain, unable to do any normal activities       severe 7. CARDIAC RISK FACTORS: "Do you have any history of heart problems or risk factors for heart disease?" (e.g., angina, prior heart attack; diabetes, high blood pressure, high cholesterol, smoker, or strong family history of heart disease)     High cholesterol 8. PULMONARY RISK FACTORS: "Do you have any history of lung disease?"  (e.g., blood clots in lung, asthma, emphysema, birth control pills)     COPD- uses O2 9. CAUSE: "What do you think is causing the chest pain?"     indigestion 10. OTHER SYMPTOMS: "Do you have any other symptoms?" (e.g., dizziness, nausea, vomiting, sweating, fever, difficulty breathing, cough)       no 11. PREGNANCY: "Is there any chance you are pregnant?" "When was your last menstrual period?"       na  Answer Assessment - Initial Assessment Questions 1. ONSET: "When did the pain begin?"      Several  months 2. LOCATION: "Where does it hurt?" (upper, mid or lower back)     lower 3. SEVERITY: "How bad is the pain?"  (e.g., Scale 1-10; mild, moderate, or severe)   - MILD (1-3): doesn't interfere with normal activities    - MODERATE (4-7): interferes with normal activities or awakens from sleep    - SEVERE (8-10): excruciating pain, unable to do any normal activities      Depends- can be severe 4. PATTERN: "Is the pain constant?" (e.g., yes, no; constant, intermittent)      Comes and goes 5. RADIATION: "Does the pain shoot into your legs or elsewhere?"     Into legs 6. CAUSE:  "What do you think is causing the back pain?"      Degenerative changes- x ray several years 7. BACK OVERUSE:  "Any recent lifting of heavy objects, strenuous work or exercise?"     no 8. MEDICATIONS: "What have you taken so far for the pain?" (e.g., nothing, acetaminophen, NSAIDS)     Tylenol arthritis 9. NEUROLOGIC SYMPTOMS: "Do you have any weakness, numbness, or problems with bowel/bladder control?"     no 10. OTHER SYMPTOMS: "Do you have any other symptoms?" (e.g., fever, abdominal pain, burning with urination, blood in urine)       no 11.  PREGNANCY: "Is there any chance you are pregnant?" (e.g., yes, no; LMP)       na  Protocols used: Chest Pain-A-AH, Back Pain-A-AH

## 2020-11-10 ENCOUNTER — Ambulatory Visit (INDEPENDENT_AMBULATORY_CARE_PROVIDER_SITE_OTHER): Payer: Medicare Other | Admitting: Physician Assistant

## 2020-11-10 ENCOUNTER — Encounter: Payer: Self-pay | Admitting: Physician Assistant

## 2020-11-10 ENCOUNTER — Other Ambulatory Visit: Payer: Self-pay

## 2020-11-10 VITALS — BP 120/45 | HR 51 | Temp 98.1°F | Ht 65.0 in | Wt 183.5 lb

## 2020-11-10 DIAGNOSIS — Z23 Encounter for immunization: Secondary | ICD-10-CM

## 2020-11-10 DIAGNOSIS — M545 Low back pain, unspecified: Secondary | ICD-10-CM

## 2020-11-10 DIAGNOSIS — G8929 Other chronic pain: Secondary | ICD-10-CM

## 2020-11-10 DIAGNOSIS — Z8639 Personal history of other endocrine, nutritional and metabolic disease: Secondary | ICD-10-CM | POA: Diagnosis not present

## 2020-11-10 DIAGNOSIS — I10 Essential (primary) hypertension: Secondary | ICD-10-CM

## 2020-11-10 DIAGNOSIS — C3492 Malignant neoplasm of unspecified part of left bronchus or lung: Secondary | ICD-10-CM

## 2020-11-10 MED ORDER — LOSARTAN POTASSIUM 25 MG PO TABS: 50.0000 mg | ORAL_TABLET | Freq: Every day | ORAL | 1 refills | Status: DC

## 2020-11-10 MED ORDER — TRAMADOL HCL 50 MG PO TABS: 50.0000 mg | ORAL_TABLET | Freq: Two times a day (BID) | ORAL | 0 refills | Status: DC | PRN

## 2020-11-10 NOTE — Assessment & Plan Note (Signed)
Hypotensive today. Pt takes Losartan 50 currently, advised to cut in half and start taking 25. Pt does not take BP at home, her and daughter will check 3-4 times a week and f/u in 1 month.

## 2020-11-10 NOTE — Patient Instructions (Addendum)
Blood pressure goal: 120-130s / 70s-90s

## 2020-11-10 NOTE — Assessment & Plan Note (Signed)
Chronic and pain is controlled. Daughter manages Tramadol intake, will refill as pt only takes as needed. We discussed physical therapy for this pain, pt and daughter are not interested. Encouraged heat, stretching. Gave copies of some basic stretches and core strengthening.

## 2020-11-10 NOTE — Assessment & Plan Note (Signed)
Overdue for follow up CT. Given increase in chest pain without a cardiac etiology, strongly encouraged return to oncology and to complete her follow up CT chest.

## 2020-11-10 NOTE — Progress Notes (Signed)
Established patient visit   Patient: Samantha Henry   DOB: 09-19-1945   75 y.o. Female  MRN: [id number] Visit Date: 11/10/2020  Today's healthcare provider: Mikey Kirschner, PA-C   Chief Complaint  Patient presents with   Back Pain   Chest Pain   Subjective    HPI   Jesusita is a 75 y/o female with a history of lung cancer who presents today with chronic chest and lower back pain.  She states that over the last few months she occasionally feels a sharp shooting pain in the left side of her chest that goes towards her armpit.  This is usually not very painful and very intermittent.  She had always attributed it to gas pain.  Over the weekend she began feeling an increase in severity and frequency of this pain which brought her to the emergency room.  I reviewed the patient's copy of her discharge summary, it appears that she had a negative cardiac work-up and was sent home. Today she is still occasionally been feeling this pain but is is much less severe.  Denies any associated diaphoresis, shortness of breath, dizziness during these episodes of pain.  Not exacerbated by activity.  Seemingly random.  She also reports chronic lower back pain, chalked up to degenerative changes.  She manages this mainly with Tylenol, occasionally when the pain is exacerbated she takes tramadol.  Denies any history of PT for her back, denies any radicular pain, increase or change in pain.     Medications: Outpatient Medications Prior to Visit  Medication Sig   acetaminophen (TYLENOL) 650 MG CR tablet Take 500 mg by mouth every 8 (eight) hours as needed for pain.   calcium carbonate (TUMS EX) 750 MG chewable tablet Chew 2 tablets by mouth 2 (two) times daily as needed for heartburn.   donepezil (ARICEPT) 10 MG tablet Take 1 tablet (10 mg total) by mouth at bedtime.   fluticasone (FLONASE) 50 MCG/ACT nasal spray Place 2 sprays into both nostrils daily.   levothyroxine (EUTHYROX) 125 MCG tablet Take 1  tablet (125 mcg total) by mouth daily.   OXYGEN Inhale 3 L into the lungs. Used at night and PRN during the day   pantoprazole (PROTONIX) 40 MG tablet Take 1 tablet by mouth once daily   Peppermint Oil (IBGARD PO) Take by mouth.   Polyethylene Glycol 3350 (MIRALAX PO) Take by mouth. 1 cap full in the morning and 1 cap full in the evening   potassium chloride SA (KLOR-CON) 20 MEQ tablet Take 1 tablet by mouth once daily   TRELEGY ELLIPTA 100-62.5-25 MCG/INH AEPB INHALE 1 PUFF ONCE DAILY   [DISCONTINUED] losartan (COZAAR) 50 MG tablet Take 1 tablet by mouth once daily   [DISCONTINUED] traMADol (ULTRAM) 50 MG tablet Take 1 tablet (50 mg total) by mouth every 12 (twelve) hours as needed.   albuterol (ACCUNEB) 0.63 MG/3ML nebulizer solution Inhale into the lungs. Take 3 mLs (0.63 mg total) by nebulization every 6 (six) hours as needed for Wheezing   No facility-administered medications prior to visit.    Review of Systems  Constitutional:  Positive for fatigue. Negative for fever.  Respiratory:  Negative for chest tightness and shortness of breath.   Cardiovascular:  Positive for chest pain. Negative for leg swelling.  Gastrointestinal:  Positive for abdominal pain. Negative for abdominal distention, blood in stool, constipation, diarrhea and nausea.  Musculoskeletal:  Positive for arthralgias and back pain. Negative for joint swelling.  Neurological:  Negative for dizziness, syncope and light-headedness.   Last CBC Lab Results  Component Value Date   WBC 4.3 08/20/2020   HGB 12.9 08/20/2020   HCT 38.9 08/20/2020   MCV 93 08/20/2020   MCH 30.9 08/20/2020   RDW 13.1 08/20/2020   PLT 133 (L) 25/63/8937   Last metabolic panel Lab Results  Component Value Date   GLUCOSE 87 08/20/2020   NA 139 08/20/2020   K 4.4 08/20/2020   CL 102 08/20/2020   CO2 24 08/20/2020   BUN 15 08/20/2020   CREATININE 0.67 08/20/2020   EGFR 92 08/20/2020   CALCIUM 9.3 08/20/2020   PHOS 2.6 (L) 01/01/2019    PROT 6.7 08/20/2020   ALBUMIN 4.1 08/20/2020   LABGLOB 2.6 08/20/2020   AGRATIO 1.6 08/20/2020   BILITOT 0.4 08/20/2020   ALKPHOS 93 08/20/2020   AST 18 08/20/2020   ALT 10 08/20/2020   ANIONGAP 4 (L) 07/29/2019   Last lipids Lab Results  Component Value Date   CHOL 305 (H) 08/20/2020   HDL 73 08/20/2020   LDLCALC 225 (H) 08/20/2020   TRIG 54 08/20/2020   CHOLHDL 4.2 08/20/2020   Last hemoglobin A1c Lab Results  Component Value Date   HGBA1C 5.7 (H) 08/20/2020   Last thyroid functions Lab Results  Component Value Date   TSH 0.508 08/20/2020   T4TOTAL 10.6 08/20/2020   Last vitamin D No results found for: 25OHVITD2, 25OHVITD3, VD25OH Last vitamin B12 and Folate Lab Results  Component Value Date   VITAMINB12 742 11/16/2017   FOLATE >20.0 11/23/2018       Objective    BP (!) 120/45 (BP Location: Right Arm, Patient Position: Sitting, Cuff Size: Large)   Pulse (!) 51   Temp 98.1 F (36.7 C) (Oral)   Ht 5' 5"  (1.651 m)   Wt 183 lb 8 oz (83.2 kg)   SpO2 98%   BMI 30.54 kg/m  BP Readings from Last 3 Encounters:  11/10/20 (!) 120/45  10/29/20 (!) 183/77  09/28/20 (!) 126/55   Wt Readings from Last 3 Encounters:  11/10/20 183 lb 8 oz (83.2 kg)  10/29/20 182 lb 9.6 oz (82.8 kg)  09/28/20 179 lb 14.3 oz (81.6 kg)      Physical Exam Constitutional:      General: She is not in acute distress. Cardiovascular:     Rate and Rhythm: Normal rate and regular rhythm.     Heart sounds: Normal heart sounds.  Pulmonary:     Effort: Pulmonary effort is normal.  Neurological:     Mental Status: She is alert and oriented to person, place, and time.     No results found for any visits on 11/10/20.  Assessment & Plan     Problem List Items Addressed This Visit       Cardiovascular and Mediastinum   Hypertension    Hypotensive today. Pt takes Losartan 50 currently, advised to cut in half and start taking 25. Pt does not take BP at home, her and daughter will  check 3-4 times a week and f/u in 1 month.      Relevant Medications   losartan (COZAAR) 25 MG tablet     Respiratory   Non-small cell lung cancer (NSCLC) ([place])    Overdue for follow up CT. Given increase in chest pain without a cardiac etiology, strongly encouraged return to oncology and to complete her follow up CT chest.       Relevant Orders   Ambulatory referral  to Radiation Oncology     Other   Chronic bilateral back pain - Primary    Chronic and pain is controlled. Daughter manages Tramadol intake, will refill as pt only takes as needed. We discussed physical therapy for this pain, pt and daughter are not interested. Encouraged heat, stretching. Gave copies of some basic stretches and core strengthening.        Relevant Medications   traMADol (ULTRAM) 50 MG tablet   Other Visit Diagnoses     History of hypokalemia       Relevant Orders   Comprehensive Metabolic Panel (CMET)   Need for immunization against influenza       Relevant Orders   Flu Vaccine QUAD High Dose(Fluad) (Completed)        Return in about 1 month (around 12/10/2020) for hypertension.      I, Mikey Kirschner, PA-C have reviewed all documentation for this visit. The documentation on  11/10/2020  for the exam, diagnosis, procedures, and orders are all accurate and complete.    Mikey Kirschner, PA-C  [facility] [phone] (phone) [phone] (fax)  [place]

## 2020-11-11 LAB — COMPREHENSIVE METABOLIC PANEL
ALT: 15 IU/L (ref 0–32)
AST: 19 IU/L (ref 0–40)
Albumin/Globulin Ratio: 1.8 (ref 1.2–2.2)
Albumin: 4.3 g/dL (ref 3.7–4.7)
Alkaline Phosphatase: 98 IU/L (ref 44–121)
BUN/Creatinine Ratio: 25 (ref 12–28)
BUN: 17 mg/dL (ref 8–27)
Bilirubin Total: 0.3 mg/dL (ref 0.0–1.2)
CO2: 27 mmol/L (ref 20–29)
Calcium: 9.3 mg/dL (ref 8.7–10.3)
Chloride: 103 mmol/L (ref 96–106)
Creatinine, Ser: 0.67 mg/dL (ref 0.57–1.00)
Globulin, Total: 2.4 g/dL (ref 1.5–4.5)
Glucose: 89 mg/dL (ref 70–99)
Potassium: 4.4 mmol/L (ref 3.5–5.2)
Sodium: 141 mmol/L (ref 134–144)
Total Protein: 6.7 g/dL (ref 6.0–8.5)
eGFR: 92 mL/min/{1.73_m2} (ref >59)

## 2020-11-16 ENCOUNTER — Encounter: Payer: Self-pay | Admitting: *Deleted

## 2020-12-02 ENCOUNTER — Ambulatory Visit: Payer: Medicare Other | Admitting: Radiation Oncology

## 2020-12-07 ENCOUNTER — Ambulatory Visit (INDEPENDENT_AMBULATORY_CARE_PROVIDER_SITE_OTHER): Payer: Medicare Other | Admitting: Family Medicine

## 2020-12-07 ENCOUNTER — Encounter: Payer: Self-pay | Admitting: Family Medicine

## 2020-12-07 ENCOUNTER — Other Ambulatory Visit: Payer: Self-pay

## 2020-12-07 ENCOUNTER — Ambulatory Visit: Payer: Self-pay

## 2020-12-07 VITALS — BP 138/54 | HR 49 | Resp 15 | Wt 186.8 lb

## 2020-12-07 DIAGNOSIS — R001 Bradycardia, unspecified: Secondary | ICD-10-CM

## 2020-12-07 DIAGNOSIS — G8929 Other chronic pain: Secondary | ICD-10-CM | POA: Insufficient documentation

## 2020-12-07 DIAGNOSIS — M545 Low back pain, unspecified: Secondary | ICD-10-CM

## 2020-12-07 DIAGNOSIS — Z9181 History of falling: Secondary | ICD-10-CM | POA: Diagnosis not present

## 2020-12-07 MED ORDER — CYCLOBENZAPRINE HCL 5 MG PO TABS
5.0000 mg | ORAL_TABLET | Freq: Three times a day (TID) | ORAL | 1 refills | Status: DC | PRN
Start: 2020-12-07 — End: 2021-02-18

## 2020-12-07 MED ORDER — MELOXICAM 7.5 MG PO TABS: 7.5000 mg | ORAL_TABLET | Freq: Every day | ORAL | 0 refills | Status: DC

## 2020-12-07 NOTE — Assessment & Plan Note (Signed)
New report per pt and family EKG completed Referral for future cards follow up placed

## 2020-12-07 NOTE — Progress Notes (Signed)
Established patient visit   Patient: Samantha Henry   DOB: 04-30-1945   75 y.o. Female  MRN: [id number] Visit Date: 12/07/2020  Today's healthcare provider: Gwyneth Sprout, FNP   Chief Complaint  Patient presents with   Fall   Subjective    Fall The accident occurred More than 1 week ago. The fall occurred while standing. She fell from a height of 1 to 2 ft. Impact surface: hit counter next to commode. There was no blood loss. The pain is present in the back. The symptoms are aggravated by movement and sitting. Associated symptoms include abdominal pain. Pertinent negatives include no bowel incontinence, fever, headaches, hearing loss, hematuria, loss of consciousness, nausea, numbness, tingling, visual change or vomiting. She has tried acetaminophen (Tylenol Arthritis 500mg ) for the symptoms. The treatment provided mild relief.     Medications: Outpatient Medications Prior to Visit  Medication Sig   acetaminophen (TYLENOL) 650 MG CR tablet Take 500 mg by mouth every 8 (eight) hours as needed for pain.   calcium carbonate (TUMS EX) 750 MG chewable tablet Chew 2 tablets by mouth 2 (two) times daily as needed for heartburn.   donepezil (ARICEPT) 10 MG tablet Take 1 tablet (10 mg total) by mouth at bedtime.   fluticasone (FLONASE) 50 MCG/ACT nasal spray Place 2 sprays into both nostrils daily.   levothyroxine (EUTHYROX) 125 MCG tablet Take 1 tablet (125 mcg total) by mouth daily.   losartan (COZAAR) 25 MG tablet Take 2 tablets (50 mg total) by mouth daily.   OXYGEN Inhale 3 L into the lungs. Used at night and PRN during the day   pantoprazole (PROTONIX) 40 MG tablet Take 1 tablet by mouth once daily   Peppermint Oil (IBGARD PO) Take by mouth.   Polyethylene Glycol 3350 (MIRALAX PO) Take by mouth. 1 cap full in the morning and 1 cap full in the evening   potassium chloride SA (KLOR-CON) 20 MEQ tablet Take 1 tablet by mouth once daily   traMADol (ULTRAM) 50 MG tablet Take 1 tablet  (50 mg total) by mouth every 12 (twelve) hours as needed.   TRELEGY ELLIPTA 100-62.5-25 MCG/INH AEPB INHALE 1 PUFF ONCE DAILY   albuterol (ACCUNEB) 0.63 MG/3ML nebulizer solution Inhale into the lungs. Take 3 mLs (0.63 mg total) by nebulization every 6 (six) hours as needed for Wheezing   No facility-administered medications prior to visit.    Review of Systems  Constitutional:  Negative for fever.  Gastrointestinal:  Positive for abdominal pain. Negative for bowel incontinence, nausea and vomiting.  Genitourinary:  Negative for hematuria.  Neurological:  Negative for tingling, loss of consciousness, numbness and headaches.      Objective    BP (!) 138/54   Pulse (!) 49   Resp 15   Wt 186 lb 12.8 oz (84.7 kg)   SpO2 100%   BMI 31.09 kg/m    Physical Exam Vitals and nursing note reviewed.  Constitutional:      General: She is not in acute distress.    Appearance: Normal appearance. She is obese. She is not ill-appearing, toxic-appearing or diaphoretic.  HENT:     Head: Normocephalic and atraumatic.  Cardiovascular:     Rate and Rhythm: Regular rhythm. Bradycardia present.     Pulses: Normal pulses.     Heart sounds: Normal heart sounds. No murmur heard.   No friction rub. No gallop.     Comments: Incomplete RBBB noted on ECG; not complete per  my review Pulmonary:     Effort: Pulmonary effort is normal. No respiratory distress.     Breath sounds: Normal breath sounds. No stridor. No wheezing, rhonchi or rales.  Chest:     Chest wall: No tenderness.  Abdominal:     General: Bowel sounds are normal.     Palpations: Abdomen is soft.  Musculoskeletal:        General: Tenderness present. No swelling, deformity or signs of injury. Normal range of motion.       Arms:     Right lower leg: No edema.     Left lower leg: No edema.     Comments: Slight tenderness at site of bruising   Skin:    General: Skin is warm and dry.     Capillary Refill: Capillary refill takes less  than 2 seconds.     Coloration: Skin is not jaundiced or pale.     Findings: Bruising present. No erythema, lesion or rash.          Comments: Bruising along R back from fall along bathroom counter  Neurological:     General: No focal deficit present.     Mental Status: She is alert and oriented to person, place, and time. Mental status is at baseline.     Cranial Nerves: No cranial nerve deficit.     Sensory: No sensory deficit.     Motor: No weakness.     Coordination: Coordination normal.  Psychiatric:        Mood and Affect: Mood normal.        Behavior: Behavior normal.        Thought Content: Thought content normal.        Cognition and Memory: Cognition is impaired. Memory is impaired.        Judgment: Judgment normal.      No results found for any visits on 12/07/20.  Assessment & Plan     Problem List Items Addressed This Visit       Other   Acute right-sided low back pain without sciatica    S/p fall; recommend use of mobic with hold of all other NSAIDs and use of intermittent heat at site for on 20 mins/ off 40 mins PRN flexeril provided No need for imaging at this time      Relevant Medications   cyclobenzaprine (FLEXERIL) 5 MG tablet   meloxicam (MOBIC) 7.5 MG tablet   Bradycardia with 41-50 beats per minute    New report per pt and family EKG completed Referral for future cards follow up placed      Relevant Orders   EKG 12-Lead (Completed)   Ambulatory referral to Cardiology   Status post fall - Primary    Fell in bathroom at daughter's mobile home, very small space Fell against single sink        Return if symptoms worsen or fail to improve.      Vonna Kotyk, FNP, have reviewed all documentation for this visit. The documentation on 12/07/20 for the exam, diagnosis, procedures, and orders are all accurate and complete.    Gwyneth Sprout, [place] [phone] (phone) [phone] (fax)  [place]

## 2020-12-07 NOTE — Telephone Encounter (Signed)
Daughter reports pt. Fell 2 weeks ago. Fell back off commode. Pain is on right side at mid-back. Cannot get comfortable. Appointment today per Arbie Cookey in the practice.      Reason for Disposition  MILD weakness (i.e., does not interfere with ability to work, go to school, normal activities) (Exception: mild weakness is a chronic symptom)  Answer Assessment - Initial Assessment Questions 1. MECHANISM: "How did the fall happen?"     Fell of commode 2. DOMESTIC VIOLENCE AND ELDER ABUSE SCREENING: "Did you fall because someone pushed you or tried to hurt you?" If Yes, ask: "Are you safe now?"     No 3. ONSET: "When did the fall happen?" (e.g., minutes, hours, or days ago)     2 weeks ago 4. LOCATION: "What part of the body hit the ground?" (e.g., back, buttocks, head, hips, knees, hands, head, stomach)     Right side - middle 5. INJURY: "Did you hurt (injure) yourself when you fell?" If Yes, ask: "What did you injure? Tell me more about this?" (e.g., body area; type of injury; pain severity)"     Hurt back 6. PAIN: "Is there any pain?" If Yes, ask: "How bad is the pain?" (e.g., Scale 1-10; or mild,  moderate, severe)   - NONE (0): No pain   - MILD (1-3): Doesn't interfere with normal activities    - MODERATE (4-7): Interferes with normal activities or awakens from sleep    - SEVERE (8-10): Excruciating pain, unable to do any normal activities      Now - 10 7. SIZE: For cuts, bruises, or swelling, ask: "How large is it?" (e.g., inches or centimeters)      No 8. PREGNANCY: "Is there any chance you are pregnant?" "When was your last menstrual period?"     No 9. OTHER SYMPTOMS: "Do you have any other symptoms?" (e.g., dizziness, fever, weakness; new onset or worsening).      No 10. CAUSE: "What do you think caused the fall (or falling)?" (e.g., tripped, dizzy spell)       Tripped  Protocols used: Falls and [facility]

## 2020-12-07 NOTE — Assessment & Plan Note (Signed)
Fell in bathroom at [employer] mobile home, very Engineering geologist against single sink

## 2020-12-07 NOTE — Telephone Encounter (Signed)
FYI 3:20 patient. KW

## 2020-12-07 NOTE — Assessment & Plan Note (Signed)
S/p fall; recommend use of mobic with hold of all other NSAIDs and use of intermittent heat at site for on 20 mins/ off 40 mins PRN flexeril provided No need for imaging at this time

## 2020-12-09 ENCOUNTER — Ambulatory Visit
Admission: RE | Admit: 2020-12-09 | Discharge: 2020-12-09 | Disposition: A | Discharge status: Home / Self Care | Payer: Medicare Other | Source: Ambulatory Visit | Attending: Radiation Oncology | Admitting: Radiation Oncology

## 2020-12-09 ENCOUNTER — Other Ambulatory Visit: Payer: Self-pay

## 2020-12-09 ENCOUNTER — Other Ambulatory Visit: Payer: Self-pay | Admitting: *Deleted

## 2020-12-09 ENCOUNTER — Encounter: Payer: Self-pay | Admitting: Radiation Oncology

## 2020-12-09 VITALS — BP 155/72 | HR 60 | Temp 97.8°F | Wt 186.7 lb

## 2020-12-09 DIAGNOSIS — R079 Chest pain, unspecified: Secondary | ICD-10-CM | POA: Insufficient documentation

## 2020-12-09 DIAGNOSIS — Z923 Personal history of irradiation: Secondary | ICD-10-CM | POA: Insufficient documentation

## 2020-12-09 DIAGNOSIS — C3412 Malignant neoplasm of upper lobe, left bronchus or lung: Secondary | ICD-10-CM | POA: Insufficient documentation

## 2020-12-09 NOTE — Progress Notes (Signed)
Radiation Oncology Follow up Note  Name: Samantha Henry   Date:   12/09/2020 MRN:  [id number] DOB: 1945-04-14    This 75 y.o. female presents to the clinic today for over 2-year follow-up status post SBRT to her left upper lobe for stage I non-small cell lung cancer.  REFERRING PROVIDER: Mikey Kirschner, PA-C  HPI: Patient is a 75 year old female now out over 2 years having completed SBRT to her left upper lobe for stage I non-small cell lung cancer.  She has been lost to follow-up since she moved recently returned to this area.  She has multiple medical issues including chest pain abdominal complaints.  She has not had a CT scan in quite a while.  She specifically denies cough hemoptysis or chest tightness.  She is here with her daughter who is asked to be set up for follow-up care..  Her last CT scans which I have reviewed showed bandlike interval development of opacity and consolidation in the peripheral area of the left upper lobe consistent with radiation changes.  COMPLICATIONS OF TREATMENT: none  FOLLOW UP COMPLIANCE: keeps appointments   PHYSICAL EXAM:  BP (!) 155/72   Pulse 60   Temp 97.8 F (36.6 C) (Tympanic)   Wt 186 lb 11.2 oz (84.7 kg)   BMI 31.07 kg/m  Well-developed well-nourished patient in NAD. HEENT reveals PERLA, EOMI, discs not visualized.  Oral cavity is clear. No oral mucosal lesions are identified. Neck is clear without evidence of cervical or supraclavicular adenopathy. Lungs are clear to A&P. Cardiac examination is essentially unremarkable with regular rate and rhythm without murmur rub or thrill. Abdomen is benign with no organomegaly or masses noted. Motor sensory and DTR levels are equal and symmetric in the upper and lower extremities. Cranial nerves II through XII are grossly intact. Proprioception is intact. No peripheral adenopathy or edema is identified. No motor or sensory levels are noted. Crude visual fields are within normal range.  RADIOLOGY  RESULTS: Previous CT scans reviewed compatible with above-stated findings  PLAN: This time of ordered another CT scan of her chest with contrast.  We will review that when it becomes available and I will see the patient in follow-up shortly thereafter.  I have also would get medical oncology involved should the be a evidence of progression of disease at that time.  Otherwise she continues close follow-up care with [facility] family [facility].  I would like to take this opportunity to thank you for allowing me to participate in the care of your patient.Noreene Filbert, MD

## 2020-12-14 ENCOUNTER — Other Ambulatory Visit: Payer: Self-pay

## 2020-12-14 ENCOUNTER — Ambulatory Visit: Payer: Medicare Other | Admitting: Cardiology

## 2020-12-14 ENCOUNTER — Encounter: Payer: Self-pay | Admitting: Cardiology

## 2020-12-14 VITALS — BP 114/70 | HR 51 | Ht 65.0 in | Wt 186.4 lb

## 2020-12-14 DIAGNOSIS — R079 Chest pain, unspecified: Secondary | ICD-10-CM

## 2020-12-14 DIAGNOSIS — I10 Essential (primary) hypertension: Secondary | ICD-10-CM

## 2020-12-14 DIAGNOSIS — R001 Bradycardia, unspecified: Secondary | ICD-10-CM

## 2020-12-14 DIAGNOSIS — I2510 Atherosclerotic heart disease of native coronary artery without angina pectoris: Secondary | ICD-10-CM | POA: Diagnosis not present

## 2020-12-14 DIAGNOSIS — R072 Precordial pain: Secondary | ICD-10-CM | POA: Diagnosis not present

## 2020-12-14 DIAGNOSIS — E7800 Pure hypercholesterolemia, unspecified: Secondary | ICD-10-CM

## 2020-12-14 MED ORDER — ATORVASTATIN CALCIUM 40 MG PO TABS: 40.0000 mg | ORAL_TABLET | Freq: Every day | ORAL | 5 refills | Status: AC

## 2020-12-14 NOTE — Patient Instructions (Signed)
Medication Instructions:   Your physician has recommended you make the following change in your medication:    START taking Lipitor 40 MG once a day.  *If you need a refill on your cardiac medications before your next appointment, please call your pharmacy*   Lab Work:  None Ordered   Testing/Procedures:   Your physician has requested that you have an echocardiogram. Echocardiography is a painless test that uses sound waves to create images of your heart. It provides your doctor with information about the size and shape of your heart and how well your heart's chambers and valves are working. This procedure takes approximately one hour. There are no restrictions for this procedure.'  2.    [place]      Your caregiver has ordered a Stress Test with nuclear imaging. The purpose of this test is to evaluate the blood supply to your heart muscle. This procedure is referred to as a "Non-Invasive Stress Test." This is because other than having an IV started in your vein, nothing is inserted or "invades" your body. Cardiac stress tests are done to find areas of poor blood flow to the heart by determining the extent of coronary artery disease (CAD). Some patients exercise on a treadmill, which naturally increases the blood flow to your heart, while others who are  unable to walk on a treadmill due to physical limitations have a pharmacologic/chemical stress agent called Lexiscan . This medicine will mimic walking on a treadmill by temporarily increasing your coronary blood flow.      PLEASE REPORT TO [facility]ICAL MALL ENTRANCE   THE VOLUNTEERS AT THE FIRST DESK WILL DIRECT YOU WHERE TO GO     *Please note: these test may take anywhere between 2-4 hours to complete       Date of Procedure:_____________________________________   Arrival Time for Procedure:______________________________    PLEASE NOTIFY THE OFFICE AT LEAST 24 HOURS IN ADVANCE IF YOU ARE UNABLE TO KEEP YOUR APPOINTMENT.   [place] 24 HOURS IN ADVANCE IF YOU ARE UNABLE TO KEEP YOUR APPOINTMENT. [phone]      How to prepare for your Myoview test:    1. Do not eat or drink after midnight  2. No caffeine for 24 hours prior to test  3. No smoking 24 hours prior to test.  4. Unless instructed otherwise, Take your medication with a small sips of water.    5.         Ladies, please do not wear dresses. Skirts or pants are appropriate. Please wear a short sleeve shirt.  6. No perfume, cologne or lotion.  7. Wear comfortable walking shoes. No heels!    Follow-Up: At [facility], you and your health needs are our priority.  As part of our continuing mission to provide you with exceptional heart care, we have created designated Provider Care Teams.  These Care Teams include your primary Cardiologist (physician) and Advanced Practice Providers (APPs -  Physician Assistants and Nurse Practitioners) who all work together to provide you with the care you need, when you need it.  We recommend signing up for the patient portal called "MyChart".  Sign up information is provided on this After Visit Summary.  MyChart is used to connect with patients for Virtual Visits (Telemedicine).  Patients are able to view lab/test results, encounter notes, upcoming appointments, etc.  Non-urgent messages can be sent to your provider as well.   To learn more  about what you can do with MyChart, go to [email or web address].    Your next appointment:   2 month(s)  The format for your next appointment:   In Person  Provider:   You may see Kate Sable, MD or one of the following Advanced Practice Providers on your designated Care Team:   Murray Hodgkins, NP Christell Faith, PA-C Cadence Kathlen Mody, [place]    Other Instructions

## 2020-12-14 NOTE — Progress Notes (Signed)
Cardiology Office Note:    Date:  12/14/2020   ID:  [name], DOB 22-May-1945, MRN [id number]  PCP:  Mikey Kirschner, PA-C   [facility] Providers Cardiologist:  Kate Sable, MD     Referring MD: Mikey Kirschner, PA-C   No chief complaint on file.   History of Present Illness:    Samantha Henry is a 74 y.o. female with a hx of hypertension, hyperlipidemia, VWD not on aspirin, former smoker x40+ years, COPD, intracranial aneurysm who presents due to bradycardia.  Patient saw primary care provider last month for scheduled visit, heart rates were noted to be low between 41 to 50 bpm.  EKG obtained showed sinus bradycardia, heart rate 52, no other abnormalities.  Chest CT 07/2019 showed LAD and left circumflex calcifications, emphysema.  She has a history of von Willebrand's disorder, not on aspirin for this.  She has easy bruisability.  She has occasional chest pain not associated with exertion ongoing over the past 3 to 4 weeks.  Has an appointment with neurologist at [facility] next week due to intracranial aneurysm.  Past Medical History:  Diagnosis Date   Arthritis    osteo   Cancer ([place]) 1969   CERVICAL CA   COPD (chronic obstructive pulmonary disease) ([place])    Diabetes mellitus without complication (HCC)    Diabetic peripheral neuropathy (HCC)    Environmental and seasonal allergies    GERD (gastroesophageal reflux disease)    History of cervical cancer    History of colon polyps    Hypercholesteremia    Hypertension    Hypothyroidism    Nail dystrophy    Neuropathy    Proteinuria    Restless legs syndrome    Sleep apnea    TIA (transient ischemic attack)    Von Willebrand's disease     Past Surgical History:  Procedure Laterality Date   ABDOMINAL HYSTERECTOMY  1969   due to cervical cancer    APPENDECTOMY     BLADDER SURGERY  1997   bladder tacking   BREAST BIOPSY Left 01/21/2015   FIBROADENOMATOUS CHANGE WITH ASSOCIATED MICROCALCIFICATIONS    CARDIAC CATHETERIZATION     CARPAL TUNNEL RELEASE  2010   colonoscopy and upper endoscopy'     COLONOSCOPY WITH PROPOFOL N/A 02/25/2016   Procedure: COLONOSCOPY WITH PROPOFOL;  Surgeon: Lollie Sails, MD;  Location: [facility] ENDOSCOPY;  Service: Endoscopy;  Laterality: N/A;   COLONOSCOPY WITH PROPOFOL N/A 03/22/2019   Procedure: COLONOSCOPY WITH PROPOFOL;  Surgeon: Robert Bellow, MD;  Location: [facility] ENDOSCOPY;  Service: Endoscopy;  Laterality: N/A;   ESOPHAGOGASTRODUODENOSCOPY (EGD) WITH PROPOFOL N/A 03/22/2019   Procedure: ESOPHAGOGASTRODUODENOSCOPY (EGD) WITH PROPOFOL;  Surgeon: Robert Bellow, MD;  Location: [facility] ENDOSCOPY;  Service: Endoscopy;  Laterality: N/A;   JOINT REPLACEMENT Left 06/14/2016   KNEE   MUSCLE BIOPSY     OOPHORECTOMY     TENDON RELEASE  2010   right foot, Dr. Vickki Muff    TOTAL KNEE ARTHROPLASTY Left 06/14/2016   Procedure: TOTAL KNEE ARTHROPLASTY;  Surgeon: Corky Mull, MD;  Location: [facility] ORS;  Service: Orthopedics;  Laterality: Left;    Current Medications: Current Meds  Medication Sig   acetaminophen (TYLENOL) 500 MG tablet Take 500 mg by mouth every 8 (eight) hours as needed.   atorvastatin (LIPITOR) 40 MG tablet Take 1 tablet (40 mg total) by mouth daily.   calcium carbonate (TUMS EX) 750 MG chewable tablet Chew 2 tablets by mouth 2 (two) times daily as  needed for heartburn.   cyclobenzaprine (FLEXERIL) 5 MG tablet Take 1 tablet (5 mg total) by mouth 3 (three) times daily as needed for muscle spasms.   donepezil (ARICEPT) 10 MG tablet Take 1 tablet (10 mg total) by mouth at bedtime.   fluticasone (FLONASE) 50 MCG/ACT nasal spray Place 2 sprays into both nostrils daily.   levothyroxine (EUTHYROX) 125 MCG tablet Take 1 tablet (125 mcg total) by mouth daily.   losartan (COZAAR) 25 MG tablet Take 2 tablets (50 mg total) by mouth daily.   meloxicam (MOBIC) 7.5 MG tablet Take 1 tablet (7.5 mg total) by mouth daily.   OXYGEN Inhale 3 L into the lungs. Used at  night and PRN during the day   pantoprazole (PROTONIX) 40 MG tablet Take 1 tablet by mouth once daily   Peppermint Oil (IBGARD PO) Take by mouth.   Polyethylene Glycol 3350 (MIRALAX PO) Take by mouth. 1 cap full in the morning and 1 cap full in the evening   potassium chloride SA (KLOR-CON) 20 MEQ tablet Take 1 tablet by mouth once daily   traMADol (ULTRAM) 50 MG tablet Take 1 tablet (50 mg total) by mouth every 12 (twelve) hours as needed.   TRELEGY ELLIPTA 100-62.5-25 MCG/INH AEPB INHALE 1 PUFF ONCE DAILY     Allergies:   Aspirin, Gabapentin, Lyrica [pregabalin], and Tetracycline   Social History   Socioeconomic History   Marital status: Divorced    Spouse name: Not on file   Number of children: 3   Years of education: Not on file   Highest education level: 8th grade  Occupational History   Occupation: retired  Tobacco Use   Smoking status: Former    Packs/day: 1.50    Years: 48.00    Pack years: 72.00    Types: Cigarettes    Quit date: 01/11/2007    Years since quitting: 13.9   Smokeless tobacco: Never   Tobacco comments:    smoked up to 2 ppd since 75 yo. quit in 2009  Vaping Use   Vaping Use: Never used  Substance and Sexual Activity   Alcohol use: No    Alcohol/week: 0.0 standard drinks   Drug use: No   Sexual activity: Not on file  Other Topics Concern   Not on file  Social History Narrative   Not on file   Social Determinants of Health   Financial Resource Strain: Low Risk    Difficulty of Paying Living Expenses: Not hard at all  Food Insecurity: No Food Insecurity   Worried About Charity fundraiser in the Last Year: Never true   [place] in the Last Year: Never true  Transportation Needs: No Transportation Needs   Lack of Transportation (Medical): No   Lack of Transportation (Non-Medical): No  Physical Activity: Inactive   Days of Exercise per Week: 0 days   Minutes of Exercise per Session: 0 min  Stress: No Stress Concern Present   Feeling of  Stress : Not at all  Social Connections: Socially Isolated   Frequency of Communication with Friends and Family: More than three times a week   Frequency of Social Gatherings with Friends and Family: More than three times a week   Attends Religious Services: Never   Marine scientist or Organizations: No   Attends Archivist Meetings: Never   Marital Status: Divorced     Family History: The patient's family history includes Alzheimer's disease in her mother; Breast cancer  in her maternal aunt; Cancer in her father; Cancer (age of onset: 27) in her sister; Colon cancer in her sister; Diabetes in her brother and sister; Hodgkin's lymphoma in her sister.  ROS:   Please see the history of present illness.     All other systems reviewed and are negative.  EKGs/Labs/Other Studies Reviewed:    The following studies were reviewed today:   EKG:  EKG is  ordered today.  The ekg ordered today demonstrates sinus bradycardia, first-degree AV block.  Heart rate 51.  Recent Labs: 08/20/2020: Hemoglobin 12.9; Platelets 133; TSH 0.508 11/10/2020: ALT 15; BUN 17; Creatinine, Ser 0.67; Potassium 4.4; Sodium 141  Recent Lipid Panel    Component Value Date/Time   CHOL 305 (H) 08/20/2020 0954   TRIG 54 08/20/2020 0954   HDL 73 08/20/2020 0954   CHOLHDL 4.2 08/20/2020 0954   CHOLHDL 2.0 12/23/2016 1141   LDLCALC 225 (H) 08/20/2020 0954   LDLCALC 65 12/23/2016 1141     Risk Assessment/Calculations:          Physical Exam:    VS:  BP 114/70   Pulse (!) 51   Ht 5\' 5"  (1.651 m)   Wt 186 lb 6.4 oz (84.6 kg)   SpO2 95%   BMI 31.02 kg/m     Wt Readings from Last 3 Encounters:  12/14/20 186 lb 6.4 oz (84.6 kg)  12/09/20 186 lb 11.2 oz (84.7 kg)  12/07/20 186 lb 12.8 oz (84.7 kg)     GEN:  Well nourished, well developed in no acute distress HEENT: Normal NECK: No JVD; No carotid bruits LYMPHATICS: No lymphadenopathy CARDIAC: RRR, 2/6 systolic murmur RESPIRATORY: No  wheezing or rhonchi, diminished breath sounds ABDOMEN: Soft, non-tender, non-distended MUSCULOSKELETAL:  No edema; No deformity  SKIN: Warm and dry NEUROLOGIC:  Alert and oriented x 3 PSYCHIATRIC:  Normal affect   ASSESSMENT:    1. Bradycardia   2. Precordial pain   3. Coronary artery disease involving native coronary artery of native heart, unspecified whether angina present   4. Primary hypertension   5. Pure hypercholesterolemia   6. Chest pain, unspecified type    PLAN:    In order of problems listed above:  Sinus bradycardia, no dizziness or syncope.  No indication for pacemaker.  Continue to monitor. Chest pain, former smoker, coronary calcification.  Get echo, get Lexiscan Myoview to evaluate ischemia. CAD calcifications noted in the LAD and left circumflex on chest CT.  Easy bruisability due to von Willebrand's disease, aspirin is being avoided.  Also has intracranial aneurysm.  Start Lipitor. Hypertension, BP controlled.  Continue losartan 25 mg daily. Hyperlipidemia, start Lipitor 40 mg daily.  Follow-up after echo and Myoview.     Shared Decision Making/Informed Consent The risks [chest pain, shortness of breath, cardiac arrhythmias, dizziness, blood pressure fluctuations, myocardial infarction, stroke/transient ischemic attack, nausea, vomiting, allergic reaction, radiation exposure, metallic taste sensation and life-threatening complications (estimated to be 1 in 10,000)], benefits (risk stratification, diagnosing coronary artery disease, treatment guidance) and alternatives of a nuclear stress test were discussed in detail with Ms. Greiner and she agrees to proceed.     Medication Adjustments/Labs and Tests Ordered: Current medicines are reviewed at length with the patient today.  Concerns regarding medicines are outlined above.  Orders Placed This Encounter  Procedures   NM Myocar Multi W/Spect W/Wall Motion / EF   EKG 12-Lead   ECHOCARDIOGRAM COMPLETE    Meds  ordered this encounter  Medications   atorvastatin (  LIPITOR) 40 MG tablet    Sig: Take 1 tablet (40 mg total) by mouth daily.    Dispense:  30 tablet    Refill:  5     Patient Instructions  Medication Instructions:   Your physician has recommended you make the following change in your medication:    START taking Lipitor 40 MG once a day.  *If you need a refill on your cardiac medications before your next appointment, please call your pharmacy*   Lab Work:  None Ordered   Testing/Procedures:   Your physician has requested that you have an echocardiogram. Echocardiography is a painless test that uses sound waves to create images of your heart. It provides your doctor with information about the size and shape of your heart and how well your heart's chambers and valves are working. This procedure takes approximately one hour. There are no restrictions for this procedure.'  2.    [place]      Your caregiver has ordered a Stress Test with nuclear imaging. The purpose of this test is to evaluate the blood supply to your heart muscle. This procedure is referred to as a "Non-Invasive Stress Test." This is because other than having an IV started in your vein, nothing is inserted or "invades" your body. Cardiac stress tests are done to find areas of poor blood flow to the heart by determining the extent of coronary artery disease (CAD). Some patients exercise on a treadmill, which naturally increases the blood flow to your heart, while others who are  unable to walk on a treadmill due to physical limitations have a pharmacologic/chemical stress agent called Lexiscan . This medicine will mimic walking on a treadmill by temporarily increasing your coronary blood flow.      PLEASE REPORT TO [facility] MEDICAL MALL ENTRANCE   THE VOLUNTEERS AT THE FIRST DESK WILL DIRECT YOU WHERE TO GO     *Please note: these test may take anywhere between 2-4 hours to complete       Date of  Procedure:_____________________________________   Arrival Time for Procedure:______________________________    PLEASE NOTIFY THE OFFICE AT LEAST 24 HOURS IN ADVANCE IF YOU ARE UNABLE TO KEEP YOUR APPOINTMENT.  [place] 24 HOURS IN ADVANCE IF YOU ARE UNABLE TO KEEP YOUR APPOINTMENT. [phone]      How to prepare for your Myoview test:    1. Do not eat or drink after midnight  2. No caffeine for 24 hours prior to test  3. No smoking 24 hours prior to test.  4. Unless instructed otherwise, Take your medication with a small sips of water.    5.         Ladies, please do not wear dresses. Skirts or pants are appropriate. Please wear a short sleeve shirt.  6. No perfume, cologne or lotion.  7. Wear comfortable walking shoes. No heels!    Follow-Up: At [facility], you and your health needs are our priority.  As part of our continuing mission to provide you with exceptional heart care, we have created designated Provider Care Teams.  These Care Teams include your primary Cardiologist (physician) and Advanced Practice Providers (APPs -  Physician Assistants and Nurse Practitioners) who all work together to provide you with the care you need, when you need it.  We recommend signing up for the patient portal called "MyChart".  Sign up information is provided on this After Visit Summary.  MyChart is used  to connect with patients for Virtual Visits (Telemedicine).  Patients are able to view lab/test results, encounter notes, upcoming appointments, etc.  Non-urgent messages can be sent to your provider as well.   To learn more about what you can do with MyChart, go to [email or web address].    Your next appointment:   2 month(s)  The format for your next appointment:   In Person  Provider:   You may see Kate Sable, MD or one of the following Advanced Practice Providers on your designated Care Team:   Murray Hodgkins, NP Christell Faith, PA-C Cadence Kathlen Mody, [place]    Other Instructions   Signed, Kate Sable, MD  12/14/2020 5:02 PM    [place]

## 2020-12-15 ENCOUNTER — Encounter: Payer: Self-pay | Admitting: Physician Assistant

## 2020-12-15 ENCOUNTER — Ambulatory Visit (INDEPENDENT_AMBULATORY_CARE_PROVIDER_SITE_OTHER): Payer: Medicare Other | Admitting: Physician Assistant

## 2020-12-15 VITALS — BP 127/73 | HR 55 | Temp 98.2°F | Ht 65.0 in | Wt 185.0 lb

## 2020-12-15 DIAGNOSIS — I10 Essential (primary) hypertension: Secondary | ICD-10-CM

## 2020-12-15 DIAGNOSIS — F03A0 Unspecified dementia, mild, without behavioral disturbance, psychotic disturbance, mood disturbance, and anxiety: Secondary | ICD-10-CM

## 2020-12-15 NOTE — Assessment & Plan Note (Signed)
On aricept. Neuro manages. Daughter has questions regarding medical power of attorney. Ref to CCM to coordinate.

## 2020-12-15 NOTE — Progress Notes (Signed)
Date:  12/15/2020   Name:  Samantha Henry   DOB:  04/23/1945   MRN:  [id number]   Chief Complaint: Hypertension  Samantha Henry is a 75 y/o female who presents today for a HTN follow up. Last visit 11/10/2020 we cut Losartan from 50 to 25 due to hypotension (120/45) and fall risk. Pt feels well, did have a mechanical fall this month, but was evaluated shortly after. Bradycardia was noted, and referred to cardiology. She is following with a cardiologist now, due to have an Echo soon, bradycardia will only be monitored. She is very active around the house, her daughter Samantha Henry watches her closely. Denies any SOB, CP, dizziness.  Lab Results  Component Value Date   NA 141 11/10/2020   K 4.4 11/10/2020   CO2 27 11/10/2020   GLUCOSE 89 11/10/2020   BUN 17 11/10/2020   CREATININE 0.67 11/10/2020   CALCIUM 9.3 11/10/2020   EGFR 92 11/10/2020   GFRNONAA >60 07/29/2019   Lab Results  Component Value Date   CHOL 305 (H) 08/20/2020   HDL 73 08/20/2020   LDLCALC 225 (H) 08/20/2020   TRIG 54 08/20/2020   CHOLHDL 4.2 08/20/2020   Lab Results  Component Value Date   TSH 0.508 08/20/2020   Lab Results  Component Value Date   HGBA1C 5.7 (H) 08/20/2020   Lab Results  Component Value Date   WBC 4.3 08/20/2020   HGB 12.9 08/20/2020   HCT 38.9 08/20/2020   MCV 93 08/20/2020   PLT 133 (L) 08/20/2020   Lab Results  Component Value Date   ALT 15 11/10/2020   AST 19 11/10/2020   ALKPHOS 98 11/10/2020   BILITOT 0.3 11/10/2020   No results found for: 25OHVITD2, 25OHVITD3, VD25OH   Review of Systems  Constitutional:  Negative for fatigue and fever.  Respiratory:  Negative for cough, chest tightness and shortness of breath.   Cardiovascular:  Negative for chest pain and leg swelling.  Neurological:  Negative for dizziness and light-headedness.   Patient Active Problem List   Diagnosis Date Noted   Acute right-sided low back pain without sciatica 12/07/2020   Bradycardia with 41-50  beats per minute 12/07/2020   Status post fall 12/07/2020   Contusion of knee 06/22/2020   Contusion of left knee 06/22/2020   Left knee pain 06/22/2020   Osteoarthritis of knee 06/22/2020   Other specified soft tissue disorders 06/22/2020   Plantar wart 05/08/2019   Venous stasis 04/29/2019   Chronic venous insufficiency 04/29/2019   Non-small cell lung cancer (NSCLC) ([place]) 03/01/2019   Dementia ([place]) 01/01/2019   Cerebrovascular disease 12/25/2018   Carotid atherosclerosis, bilateral 12/25/2018   Right internal carotid artery aneurysm 89/38/1017   Systolic murmur 51/02/5850   Senile purpura ([place]) 11/23/2018   Iron deficiency anemia 11/23/2018   Intention tremor 11/23/2018   Lung nodule seen on imaging study 11/23/2018   Aortic atherosclerosis ([place]) 07/12/2018   Coronary artery disease 07/12/2018   Aortic valve calcification 07/12/2018   DDD (degenerative disc disease), cervical 02/01/2018   Chronic bilateral back pain 02/01/2018   Morbid obesity ([place]) 06/26/2017   History of smoking 30 or more pack years 05/12/2017   Status post total knee replacement using cement, left 06/14/2016   Polyneuropathy 02/26/2015   Allergic rhinitis 07/29/2014   COPD (chronic obstructive pulmonary disease) ([place]) 07/25/2014   Type 2 diabetes mellitus with neurological complications ([place]) 77/82/4235   Diverticulosis of colon 07/25/2014   Edema 07/25/2014   GERD (gastroesophageal  reflux disease) 07/25/2014   Hypercholesteremia 07/25/2014   Hypertension 07/25/2014   Hypothyroid 07/25/2014   Cramps of lower extremity 07/25/2014   Microalbuminuria 07/25/2014   Nail dystrophy 07/25/2014   Osteoarthritis of leg 07/25/2014   Restless legs syndrome 07/25/2014   TIA (transient ischemic attack) 07/25/2014   Von Willebrand's disease 07/25/2014   OSA (obstructive sleep apnea) 10/31/2013   History of adenomatous polyp of colon 08/26/2005    Allergies  Allergen Reactions   Aspirin     AVOIDs due to  history of Von Willebrands   Gabapentin     dizzy   Lyrica [Pregabalin] Other (See Comments)    Dizziness   Tetracycline Rash    Past Surgical History:  Procedure Laterality Date   ABDOMINAL HYSTERECTOMY  1969   due to cervical cancer    APPENDECTOMY     BLADDER SURGERY  1997   bladder tacking   BREAST BIOPSY Left 01/21/2015   FIBROADENOMATOUS CHANGE WITH ASSOCIATED MICROCALCIFICATIONS   CARDIAC CATHETERIZATION     CARPAL TUNNEL RELEASE  2010   colonoscopy and upper endoscopy'     COLONOSCOPY WITH PROPOFOL N/A 02/25/2016   Procedure: COLONOSCOPY WITH PROPOFOL;  Surgeon: Lollie Sails, MD;  Location: [facility] ENDOSCOPY;  Service: Endoscopy;  Laterality: N/A;   COLONOSCOPY WITH PROPOFOL N/A 03/22/2019   Procedure: COLONOSCOPY WITH PROPOFOL;  Surgeon: Robert Bellow, MD;  Location: [facility] ENDOSCOPY;  Service: Endoscopy;  Laterality: N/A;   ESOPHAGOGASTRODUODENOSCOPY (EGD) WITH PROPOFOL N/A 03/22/2019   Procedure: ESOPHAGOGASTRODUODENOSCOPY (EGD) WITH PROPOFOL;  Surgeon: Robert Bellow, MD;  Location: [facility] ENDOSCOPY;  Service: Endoscopy;  Laterality: N/A;   JOINT REPLACEMENT Left 06/14/2016   KNEE   MUSCLE BIOPSY     OOPHORECTOMY     TENDON RELEASE  2010   right foot, Dr. Vickki Muff    TOTAL KNEE ARTHROPLASTY Left 06/14/2016   Procedure: TOTAL KNEE ARTHROPLASTY;  Surgeon: Corky Mull, MD;  Location: [facility] ORS;  Service: Orthopedics;  Laterality: Left;    Social History   Tobacco Use   Smoking status: Former    Packs/day: 1.50    Years: 48.00    Pack years: 72.00    Types: Cigarettes    Quit date: 01/11/2007    Years since quitting: 13.9   Smokeless tobacco: Never   Tobacco comments:    smoked up to 2 ppd since 75 yo. quit in 2009  Vaping Use   Vaping Use: Never used  Substance Use Topics   Alcohol use: No    Alcohol/week: 0.0 standard drinks   Drug use: No     Medication list has been reviewed and updated.  Current Meds  Medication Sig   acetaminophen (TYLENOL)  500 MG tablet Take 500 mg by mouth every 8 (eight) hours as needed.   albuterol (VENTOLIN HFA) 108 (90 Base) MCG/ACT inhaler Inhale into the lungs every 6 (six) hours as needed for wheezing or shortness of breath.   atorvastatin (LIPITOR) 40 MG tablet Take 1 tablet (40 mg total) by mouth daily.   calcium carbonate (TUMS EX) 750 MG chewable tablet Chew 2 tablets by mouth 2 (two) times daily as needed for heartburn.   cyclobenzaprine (FLEXERIL) 5 MG tablet Take 1 tablet (5 mg total) by mouth 3 (three) times daily as needed for muscle spasms.   donepezil (ARICEPT) 10 MG tablet Take 1 tablet (10 mg total) by mouth at bedtime.   fluticasone (FLONASE) 50 MCG/ACT nasal spray Place 2 sprays into both nostrils daily.  levothyroxine (EUTHYROX) 125 MCG tablet Take 1 tablet (125 mcg total) by mouth daily.   losartan (COZAAR) 25 MG tablet Take 2 tablets (50 mg total) by mouth daily.   meloxicam (MOBIC) 7.5 MG tablet Take 1 tablet (7.5 mg total) by mouth daily.   OXYGEN Inhale 3 L into the lungs. Used at night and PRN during the day   pantoprazole (PROTONIX) 40 MG tablet Take 1 tablet by mouth once daily   Peppermint Oil (IBGARD PO) Take by mouth as needed.   Polyethylene Glycol 3350 (MIRALAX PO) Take by mouth as needed.   potassium chloride SA (KLOR-CON) 20 MEQ tablet Take 1 tablet by mouth once daily   traMADol (ULTRAM) 50 MG tablet Take 1 tablet (50 mg total) by mouth every 12 (twelve) hours as needed.   TRELEGY ELLIPTA 100-62.5-25 MCG/INH AEPB INHALE 1 PUFF ONCE DAILY   [DISCONTINUED] albuterol (ACCUNEB) 0.63 MG/3ML nebulizer solution Inhale into the lungs. Take 3 mLs (0.63 mg total) by nebulization every 6 (six) hours as needed for Wheezing    PHQ 2/9 Scores 12/15/2020 11/10/2020 02/20/2020 11/22/2018  PHQ - 2 Score 0 0 0 2  PHQ- 9 Score 0 0 - 2    No flowsheet data found.  BP Readings from Last 3 Encounters:  12/15/20 127/73  12/14/20 114/70  12/09/20 (!) 155/72    Physical  Exam Constitutional:      General: She is awake.     Appearance: She is well-developed.  HENT:     Head: Normocephalic.  Eyes:     Conjunctiva/sclera: Conjunctivae normal.  Cardiovascular:     Rate and Rhythm: Normal rate and regular rhythm.     Heart sounds: Murmur heard.  Pulmonary:     Effort: Pulmonary effort is normal.     Breath sounds: Normal breath sounds.  Skin:    General: Skin is warm.  Neurological:     Mental Status: She is alert and oriented to person, place, and time.  Psychiatric:        Attention and Perception: Attention normal.        Mood and Affect: Mood normal.        Speech: Speech normal.        Behavior: Behavior is cooperative.    Wt Readings from Last 3 Encounters:  12/15/20 185 lb (83.9 kg)  12/14/20 186 lb 6.4 oz (84.6 kg)  12/09/20 186 lb 11.2 oz (84.7 kg)    BP 127/73   Pulse (!) 55   Temp 98.2 F (36.8 C) (Oral)   Ht 5' 5" (1.651 m)   Wt 185 lb (83.9 kg)   SpO2 100%   BMI 30.79 kg/m  Blood pressure 127/73, pulse (!) 55, temperature 98.2 F (36.8 C), temperature source Oral, height 5' 5" (1.651 m), weight 185 lb (83.9 kg), SpO2 100 %.   Assessment and Plan:   Problem List Items Addressed This Visit       Cardiovascular and Mediastinum   Hypertension - Primary    Improved BP today, denies sxs hypotension. Continue Losartan 25 F/u 3 mo        Nervous and Auditory   Dementia (HCC)    On aricept. Neuro manages. Daughter has questions regarding medical power of attorney. Ref to CCM to coordinate.       Relevant Orders   AMB Referral to [place], PA-C have reviewed all documentation for this visit. The documentation on  12/15/2020 for the exam, diagnosis,  procedures, and orders are all accurate and complete.  Mikey Kirschner, PA-C [facility] [address] [place], [place], [address] Office: [phone] Fax: [phone]

## 2020-12-15 NOTE — Assessment & Plan Note (Addendum)
Improved BP today, denies sxs hypotension. Continue Losartan 25 F/u 3 mo

## 2020-12-16 ENCOUNTER — Telehealth: Payer: Self-pay | Admitting: *Deleted

## 2020-12-16 NOTE — Chronic Care Management (AMB) (Signed)
  Chronic Care Management   Note  12/16/2020 Name: Samantha Henry MRN: [id number] DOB: 08-Feb-1945  Samantha Henry is a 75 y.o. year old female who is a primary care patient of Mikey Kirschner, [place]. I reached out to [name] by phone today in response to a referral sent by [name] PCP.  [name] was given information about Chronic Care Management services today including:  CCM service includes personalized support from designated clinical staff supervised by her physician, including individualized plan of care and coordination with other care providers 24/7 contact phone numbers for assistance for urgent and routine care needs. Service will only be billed when office clinical staff spend 20 minutes or more in a month to coordinate care. Only one practitioner may furnish and bill the service in a calendar month. The patient may stop CCM services at any time (effective at the end of the month) by phone call to the office staff. The patient is responsible for co-pay (up to 20% after annual deductible is met) if co-pay is required by the individual health plan.   Patient agreed to services and verbal consent obtained.   Follow up plan: Telephone appointment with care management team member scheduled for: 12/22/2020 and 12/28/2020  Julian Hy, [place] Management  Direct Dial: [phone]

## 2020-12-22 ENCOUNTER — Ambulatory Visit: Payer: Medicare Other | Admitting: *Deleted

## 2020-12-22 DIAGNOSIS — F03A0 Unspecified dementia, mild, without behavioral disturbance, psychotic disturbance, mood disturbance, and anxiety: Secondary | ICD-10-CM

## 2020-12-22 DIAGNOSIS — I10 Essential (primary) hypertension: Secondary | ICD-10-CM

## 2020-12-22 NOTE — Patient Instructions (Signed)
Visit Information  Thank you for taking time to visit with me today. Please don't hesitate to contact me if I can be of assistance to you before our next scheduled telephone appointment.  Following are the goals we discussed today:  - follow-up on any referrals for help I am given - have a back-up plan - make a list of family or friends that I can call  Our next appointment is by telephone on 01/13/21 at 3:30pm  Please call the care guide team at [phone] if you need to cancel or reschedule your appointment.   If you are experiencing a Mental Health or [place] or need someone to talk to, please call the Suicide and Crisis Lifeline: 988   Following is a copy of your full plan of care:  Care Plan : General Social Work (Adult)  Updates made by [employer], Darla Lesches, LCSW since 12/22/2020 12:00 AM     Problem: CHL AMB "PATIENT-SPECIFIC PROBLEM"   Note:   CARE PLAN ENTRY (see longitudinal plan of care for additional care plan information)  Current Barriers:  Patient with Dementia in need of assistance with connection to community resources Family conflicts related to legal responsibility for patient-family not in agreement of care plan History of Adult Protective Services involvement and Sheriff related to patient's care-now resolved  Knowledge deficits and need for support, education and care coordination related to community resources support  Memory Deficits, Inability to perform IADL's independently, and Lacks knowledge of community resource: Elder Designer, fashion/clothing)  Over the next 90 days, patient's daughter will work with Copywriter, advertising of choice to address needs related to [employer] of Attorney Interventions provided by LCSW:  Assessed patient's care coordination needs related to care needs and discussed ongoing care management follow up  Confirmed that patient lives with daughter [name] at [address] [place], [place] [address] and is agreeable to  remaining in her home Confirmed that patient's daughter provides for all of patient's care needs Discussed POA and the different options for establishing this-advised consultation with an Elder Law Attorney-Resource list provided Patient interviewed and appropriate assessments performed Depression screen reviewed  PHQ2/ PHQ9 completed Active listening / Reflection utilized  Emotional Support Provided   Patient Self Care Activities & Deficits:  Patient is unable to independently navigate community resource options without care coordination support  Acknowledges deficits and is motivated to resolve concern  Patient's daughter able to contact Elder Training and development officer from list provided  as discussed today Unable to perform IADLs independently Attends all scheduled provider appointments Strong family or social support  Initial goal documentation       Ms. Reder was given information about Care Management services by the embedded care coordination team including:  Care Management services include personalized support from designated clinical staff supervised by her physician, including individualized plan of care and coordination with other care providers 24/7 contact phone numbers for assistance for urgent and routine care needs. The patient may stop CCM services at any time (effective at the end of the month) by phone call to the office staff.  Patient agreed to services and verbal consent obtained.   The patient verbalized understanding of instructions, educational materials, and care plan provided today and declined offer to receive copy of patient instructions, educational materials, and care plan.   Telephone follow up appointment with care management team member scheduled for:01/13/21   Elliot Gurney, [place] Worker  [facility] Care [facility] [phone]

## 2020-12-22 NOTE — Chronic Care Management (AMB) (Addendum)
Chronic Care Management    Clinical Social Work Note  12/22/2020 Name: Samantha Henry MRN: [id number] DOB: 02-02-1945  Samantha Henry is a 75 y.o. year old female who is a primary care patient of Mikey Kirschner, [place]. The CCM team was consulted to assist the patient with chronic disease management and/or care coordination needs related to: [employer] .   Engaged with patient by telephone for initial visit in response to provider referral for social work chronic care management and care coordination services.   Consent to Services:  The patient was given the following information about Chronic Care Management services today, agreed to services, and gave verbal consent: 1. CCM service includes personalized support from designated clinical staff supervised by the primary care provider, including individualized plan of care and coordination with other care providers 2. 24/7 contact phone numbers for assistance for urgent and routine care needs. 3. Service will only be billed when office clinical staff spend 20 minutes or more in a month to coordinate care. 4. Only one practitioner may furnish and bill the service in a calendar month. 5.The patient may stop CCM services at any time (effective at the end of the month) by phone call to the office staff. 6. The patient will be responsible for cost sharing (co-pay) of up to 20% of the service fee (after annual deductible is met). Patient agreed to services and consent obtained.  Patient agreed to services and consent obtained.   Assessment: Review of patient past medical history, allergies, medications, and health status, including review of relevant consultants reports was performed today as part of a comprehensive evaluation and provision of chronic care management and care coordination services.     SDOH (Social Determinants of Health) assessments and interventions performed:    Advanced Directives Status: Not addressed in this  encounter.  CCM Care Plan  Allergies  Allergen Reactions   Aspirin     AVOIDs due to history of Von Willebrands   Gabapentin     dizzy   Lyrica [Pregabalin] Other (See Comments)    Dizziness   Tetracycline Rash    Outpatient Encounter Medications as of 12/22/2020  Medication Sig   acetaminophen (TYLENOL) 500 MG tablet Take 500 mg by mouth every 8 (eight) hours as needed.   albuterol (VENTOLIN HFA) 108 (90 Base) MCG/ACT inhaler Inhale into the lungs every 6 (six) hours as needed for wheezing or shortness of breath.   atorvastatin (LIPITOR) 40 MG tablet Take 1 tablet (40 mg total) by mouth daily.   calcium carbonate (TUMS EX) 750 MG chewable tablet Chew 2 tablets by mouth 2 (two) times daily as needed for heartburn.   cyclobenzaprine (FLEXERIL) 5 MG tablet Take 1 tablet (5 mg total) by mouth 3 (three) times daily as needed for muscle spasms.   donepezil (ARICEPT) 10 MG tablet Take 1 tablet (10 mg total) by mouth at bedtime.   fluticasone (FLONASE) 50 MCG/ACT nasal spray Place 2 sprays into both nostrils daily.   levothyroxine (EUTHYROX) 125 MCG tablet Take 1 tablet (125 mcg total) by mouth daily.   losartan (COZAAR) 25 MG tablet Take 2 tablets (50 mg total) by mouth daily.   meloxicam (MOBIC) 7.5 MG tablet Take 1 tablet (7.5 mg total) by mouth daily.   OXYGEN Inhale 3 L into the lungs. Used at night and PRN during the day   pantoprazole (PROTONIX) 40 MG tablet Take 1 tablet by mouth once daily   Peppermint Oil (IBGARD PO) Take by mouth as  needed.   Polyethylene Glycol 3350 (MIRALAX PO) Take by mouth as needed.   potassium chloride SA (KLOR-CON) 20 MEQ tablet Take 1 tablet by mouth once daily   traMADol (ULTRAM) 50 MG tablet Take 1 tablet (50 mg total) by mouth every 12 (twelve) hours as needed.   TRELEGY ELLIPTA 100-62.5-25 MCG/INH AEPB INHALE 1 PUFF ONCE DAILY   No facility-administered encounter medications on file as of 12/22/2020.    Patient Active Problem List   Diagnosis  Date Noted   Acute right-sided low back pain without sciatica 12/07/2020   Bradycardia with 41-50 beats per minute 12/07/2020   Status post fall 12/07/2020   Contusion of knee 06/22/2020   Contusion of left knee 06/22/2020   Left knee pain 06/22/2020   Osteoarthritis of knee 06/22/2020   Other specified soft tissue disorders 06/22/2020   Plantar wart 05/08/2019   Venous stasis 04/29/2019   Chronic venous insufficiency 04/29/2019   Non-small cell lung cancer (NSCLC) ([place]) 03/01/2019   Dementia ([place]) 01/01/2019   Cerebrovascular disease 12/25/2018   Carotid atherosclerosis, bilateral 12/25/2018   Right internal carotid artery aneurysm 66/44/0347   Systolic murmur 42/59/5638   Senile purpura ([place]) 11/23/2018   Iron deficiency anemia 11/23/2018   Intention tremor 11/23/2018   Lung nodule seen on imaging study 11/23/2018   Aortic atherosclerosis ([place]) 07/12/2018   Coronary artery disease 07/12/2018   Aortic valve calcification 07/12/2018   DDD (degenerative disc disease), cervical 02/01/2018   Chronic bilateral back pain 02/01/2018   Morbid obesity ([place]) 06/26/2017   History of smoking 30 or more pack years 05/12/2017   Status post total knee replacement using cement, left 06/14/2016   Polyneuropathy 02/26/2015   Allergic rhinitis 07/29/2014   COPD (chronic obstructive pulmonary disease) ([place]) 07/25/2014   Type 2 diabetes mellitus with neurological complications ([place]) 75/64/3329   Diverticulosis of colon 07/25/2014   Edema 07/25/2014   GERD (gastroesophageal reflux disease) 07/25/2014   Hypercholesteremia 07/25/2014   Hypertension 07/25/2014   Hypothyroid 07/25/2014   Cramps of lower extremity 07/25/2014   Microalbuminuria 07/25/2014   Nail dystrophy 07/25/2014   Osteoarthritis of leg 07/25/2014   Restless legs syndrome 07/25/2014   TIA (transient ischemic attack) 07/25/2014   Von Willebrand's disease 07/25/2014   OSA (obstructive sleep apnea) 10/31/2013   History of  adenomatous polyp of colon 08/26/2005    Conditions to be addressed/monitored: Dementia; Memory Deficits  Care Plan : General Social Work (Adult)  Updates made by [employer], Darla Lesches, LCSW since 12/22/2020 12:00 AM     Problem: CHL AMB "PATIENT-SPECIFIC PROBLEM"   Note:   CARE PLAN ENTRY (see longitudinal plan of care for additional care plan information)  Current Barriers:  Patient with Dementia in need of assistance with connection to community resources Family conflicts related to legal responsibility for patient-family not in agreement of care plan History of Adult Protective Services involvement and Sheriff related to patient's care-now resolved  Knowledge deficits and need for support, education and care coordination related to community resources support  Memory Deficits, Inability to perform IADL's independently, and Lacks knowledge of community resource: Elder Designer, fashion/clothing)  Over the next 90 days, patient's daughter will work with Copywriter, advertising of choice to address needs related to [employer] of Attorney Interventions provided by LCSW:  Assessed patient's care coordination needs related to care needs and discussed ongoing care management follow up  Confirmed that patient lives with daughter [name] at [address] [place], [place] [address] and is  agreeable to remaining in her home Confirmed that patient's daughter provides for all of patient's care needs Discussed POA and the different options for establishing this-advised consultation with an Elder Law Attorney-Resource list provided Patient interviewed and appropriate assessments performed Depression screen reviewed  PHQ2/ PHQ9 completed Active listening / Reflection utilized  Emotional Support Provided   Patient Self Care Activities & Deficits:  Patient is unable to independently navigate community resource options without care coordination support  Acknowledges deficits and is motivated to resolve  concern  Patient's daughter able to contact Elder Training and development officer from list provided  as discussed today Unable to perform IADLs independently Attends all scheduled provider appointments Strong family or social support  Initial goal documentation        Follow Up Plan: Appointment scheduled for SW follow up with client by phone on: 01/13/21       Elliot Gurney, [place] Worker  [facility] Care Management [phone]

## 2020-12-25 ENCOUNTER — Encounter
Admission: RE | Admit: 2020-12-25 | Discharge: 2020-12-25 | Disposition: A | Discharge status: Home / Self Care | Payer: Medicare Other | Source: Ambulatory Visit | Attending: Cardiology | Admitting: Cardiology

## 2020-12-25 ENCOUNTER — Other Ambulatory Visit: Payer: Self-pay

## 2020-12-25 DIAGNOSIS — R079 Chest pain, unspecified: Secondary | ICD-10-CM | POA: Diagnosis not present

## 2020-12-25 LAB — NM MYOCAR MULTI W/SPECT W/WALL MOTION / EF
Base ST Depression (mm): 0 mm
LV dias vol: 74 mL (ref 46–106)
LV sys vol: 29 mL
Nuc Stress EF: 61 %
Peak HR: 92 {beats}/min
Percent HR: 63 %
Rest HR: 59 {beats}/min
Rest Nuclear Isotope Dose: 10.8 mCi
SDS: 2
SRS: 0
SSS: 2
ST Depression (mm): 0 mm
Stress Nuclear Isotope Dose: 30.8 mCi
TID: 1.1

## 2020-12-25 MED ORDER — TECHNETIUM TC 99M TETROFOSMIN IV KIT
10.8100 | PACK | Freq: Once | INTRAVENOUS | Status: AC | PRN
  Administered 2020-12-25: 10.81 via INTRAVENOUS

## 2020-12-25 MED ORDER — TECHNETIUM TC 99M TETROFOSMIN IV KIT
30.8300 | PACK | Freq: Once | INTRAVENOUS | Status: AC | PRN
  Administered 2020-12-25: 30.83 via INTRAVENOUS

## 2020-12-25 MED ORDER — REGADENOSON 0.4 MG/5ML IV SOLN
0.4000 mg | Freq: Once | INTRAVENOUS | Status: AC
  Administered 2020-12-25: 0.4 mg via INTRAVENOUS

## 2020-12-28 ENCOUNTER — Ambulatory Visit (INDEPENDENT_AMBULATORY_CARE_PROVIDER_SITE_OTHER): Payer: Medicare Other

## 2020-12-28 DIAGNOSIS — J449 Chronic obstructive pulmonary disease, unspecified: Secondary | ICD-10-CM

## 2020-12-28 DIAGNOSIS — I10 Essential (primary) hypertension: Secondary | ICD-10-CM

## 2020-12-28 DIAGNOSIS — E1149 Type 2 diabetes mellitus with other diabetic neurological complication: Secondary | ICD-10-CM

## 2020-12-28 DIAGNOSIS — Z9181 History of falling: Secondary | ICD-10-CM

## 2020-12-28 NOTE — Chronic Care Management (AMB) (Signed)
Chronic Care Management   CCM RN Visit Note  12/28/2020 Name: Samantha Henry MRN: [id number] DOB: 22-May-1945  Subjective: Samantha Henry is a 75 y.o. year old female who is a primary care patient of Mikey Kirschner, [place]. The care management team was consulted for assistance with disease management and care coordination needs.    Engaged with patient's daughter/caregiver by telephone for initial visit in response to provider referral for case management and care coordination services.   Consent to Services:  The patient's caregiver was given the following information about Chronic Care Management services: 1. CCM service includes personalized support from designated clinical staff supervised by the primary care provider, including individualized plan of care and coordination with other care providers 2. 24/7 contact phone numbers for assistance for urgent and routine care needs. 3. Service will only be billed when office clinical staff spend 20 minutes or more in a month to coordinate care. 4. Only one practitioner may furnish and bill the service in a calendar month. 5.The patient may stop CCM services at any time (effective at the end of the month) by phone call to the office staff. 6. The patient will be responsible for cost sharing (co-pay) of up to 20% of the service fee (after annual deductible is met). Patient's caregiver agreed to services and consent obtained.  Assessment: Review of patient past medical history, allergies, medications, health status, including review of consultants reports, laboratory and other test data, was performed as part of comprehensive evaluation and provision of chronic care management services.   SDOH (Social Determinants of Health) assessments and interventions performed:  SDOH Interventions    Flowsheet Row Most Recent Value  SDOH Interventions   Food Insecurity Interventions Intervention Not Indicated  Transportation Interventions Intervention Not  Indicated        CCM Care Plan  Allergies  Allergen Reactions   Aspirin     AVOIDs due to history of Von Willebrands   Gabapentin     dizzy   Lyrica [Pregabalin] Other (See Comments)    Dizziness   Tetracycline Rash    Outpatient Encounter Medications as of 12/28/2020  Medication Sig   acetaminophen (TYLENOL) 500 MG tablet Take 500 mg by mouth every 8 (eight) hours as needed.   albuterol (VENTOLIN HFA) 108 (90 Base) MCG/ACT inhaler Inhale into the lungs every 6 (six) hours as needed for wheezing or shortness of breath.   atorvastatin (LIPITOR) 40 MG tablet Take 1 tablet (40 mg total) by mouth daily.   calcium carbonate (TUMS EX) 750 MG chewable tablet Chew 2 tablets by mouth 2 (two) times daily as needed for heartburn.   cyclobenzaprine (FLEXERIL) 5 MG tablet Take 1 tablet (5 mg total) by mouth 3 (three) times daily as needed for muscle spasms.   donepezil (ARICEPT) 10 MG tablet Take 1 tablet (10 mg total) by mouth at bedtime.   fluticasone (FLONASE) 50 MCG/ACT nasal spray Place 2 sprays into both nostrils daily.   levothyroxine (EUTHYROX) 125 MCG tablet Take 1 tablet (125 mcg total) by mouth daily.   losartan (COZAAR) 25 MG tablet Take 2 tablets (50 mg total) by mouth daily.   meloxicam (MOBIC) 7.5 MG tablet Take 1 tablet (7.5 mg total) by mouth daily.   OXYGEN Inhale 3 L into the lungs. Used at night and PRN during the day   pantoprazole (PROTONIX) 40 MG tablet Take 1 tablet by mouth once daily   Peppermint Oil (IBGARD PO) Take by mouth as needed.   Polyethylene  Glycol 3350 (MIRALAX PO) Take by mouth as needed.   potassium chloride SA (KLOR-CON) 20 MEQ tablet Take 1 tablet by mouth once daily   traMADol (ULTRAM) 50 MG tablet Take 1 tablet (50 mg total) by mouth every 12 (twelve) hours as needed.   TRELEGY ELLIPTA 100-62.5-25 MCG/INH AEPB INHALE 1 PUFF ONCE DAILY   No facility-administered encounter medications on file as of 12/28/2020.    Patient Active Problem List    Diagnosis Date Noted   Acute right-sided low back pain without sciatica 12/07/2020   Bradycardia with 41-50 beats per minute 12/07/2020   Status post fall 12/07/2020   Contusion of knee 06/22/2020   Contusion of left knee 06/22/2020   Left knee pain 06/22/2020   Osteoarthritis of knee 06/22/2020   Other specified soft tissue disorders 06/22/2020   Plantar wart 05/08/2019   Venous stasis 04/29/2019   Chronic venous insufficiency 04/29/2019   Non-small cell lung cancer (NSCLC) ([place]) 03/01/2019   Dementia ([place]) 01/01/2019   Cerebrovascular disease 12/25/2018   Carotid atherosclerosis, bilateral 12/25/2018   Right internal carotid artery aneurysm 16/38/4665   Systolic murmur 99/35/7017   Senile purpura ([place]) 11/23/2018   Iron deficiency anemia 11/23/2018   Intention tremor 11/23/2018   Lung nodule seen on imaging study 11/23/2018   Aortic atherosclerosis ([place]) 07/12/2018   Coronary artery disease 07/12/2018   Aortic valve calcification 07/12/2018   DDD (degenerative disc disease), cervical 02/01/2018   Chronic bilateral back pain 02/01/2018   Morbid obesity ([place]) 06/26/2017   History of smoking 30 or more pack years 05/12/2017   Status post total knee replacement using cement, left 06/14/2016   Polyneuropathy 02/26/2015   Allergic rhinitis 07/29/2014   COPD (chronic obstructive pulmonary disease) ([place]) 07/25/2014   Type 2 diabetes mellitus with neurological complications ([place]) 79/39/0300   Diverticulosis of colon 07/25/2014   Edema 07/25/2014   GERD (gastroesophageal reflux disease) 07/25/2014   Hypercholesteremia 07/25/2014   Hypertension 07/25/2014   Hypothyroid 07/25/2014   Cramps of lower extremity 07/25/2014   Microalbuminuria 07/25/2014   Nail dystrophy 07/25/2014   Osteoarthritis of leg 07/25/2014   Restless legs syndrome 07/25/2014   TIA (transient ischemic attack) 07/25/2014   Von Willebrand's disease 07/25/2014   OSA (obstructive sleep apnea) 10/31/2013   History  of adenomatous polyp of colon 08/26/2005     Patient Care Plan: RN Care Management Plan of Care     Problem Identified: COPD, HTN, HLD, DM, and Fall Risk      Long-Range Goal: Disease Progression Prevented or Minimized   Start Date: 12/28/2020  Expected End Date: 03/28/2021  Priority: High  Note:   Current Barriers:  Chronic Disease Management support and education needs related to HTN, HLD, COPD, DMII, and Fall Risk.  RNCM Clinical Goal(s):  Patient will demonstrate Ongoing adherence to prescribed treatment plan for HTN, HLD, COPD, DMII, and Fall Risk through collaboration with the provider, RN Care manager, and the care team.   Interventions: 1:1 collaboration with primary care provider regarding development and update of comprehensive plan of care as evidenced by provider attestation and co-signature Inter-disciplinary care team collaboration (see longitudinal plan of care) Evaluation of current treatment plan related to  self management and patient's adherence to plan as established by provider   COPD Interventions:  (Status:  New goal.) Long Term Goal Reviewed medications and plan for COPD management with patient's daughter. Discussed triggers and concerns related to weather changes. Daughter reports symptoms have been well controlled. She has not required frequent  use of albuterol. Reports she is currently using supplemental oxygen at 4L/min as needed.  Reviewed current action plan and reinforced importance of daily self-assessment. Daughter advised to make an appointment with a provider if Ms. Dillow experiences moderate symptoms for greater than 48 hours without improvement.  Provided information regarding infection prevention and increased risk r/t COPD. Advised to utilize prevention strategies to reduce risk of respiratory infection  Reviewed worsening symptoms that require immediate medical attention.    Diabetes Interventions:  (Status:  New goal.) Long Term  Goal Assessed patient's understanding of A1c goal: <7% Lab Results  Component Value Date   HGBA1C 5.7 (H) 08/20/2020  Reviewed plan for diabetes management. Her diabetes is well management. A1C is currently at goal. Daughter is assisting with meals and reports significant improvement with adhering to the recommended ADA diet. Declined need for additional diabetic resources. Discussed importance of completing recommended DM preventive care. Daughter reports exams are up to date.   Falls Interventions:  (Status:  New goal.) Long Term Goal Fall Risk 09/28/2020 11/10/2020 12/09/2020 12/15/2020 12/28/2020  Falls in the past year? - 1 - 1 1  Was there an injury with Fall? - 0 - 1 1  Fall Risk Category Calculator - 1 - 2 2  Fall Risk Category - Low - Moderate Moderate  Patient Fall Risk Level Moderate fall risk Low fall risk Low fall risk Low fall risk Moderate fall risk  Patient at Risk for Falls Due to - - - History of fall(s) History of fall(s);Impaired balance/gait  Fall risk Follow up - - - Falls evaluation completed Falls prevention discussed    Reviewed medications and discussed potential side effects. Provided information regarding safety and fall prevention. Daughter reports she is currently ambulating well in the home. She has a walker to use when needed. Reports that her activity tolerance has improved. Mostly requires an assistive device if ambulating for prolonged periods.  Discussed ability to perform ADL's and IADL's. Daughter reports that she requires assistance getting in and out of the shower d/t increased risk of slipping and falling but performs ADL's independently. Reports she can perform light tasks such as sweeping, washing dishes, and folding laundry independently. She has good support. Daughter will keep the care management team updated of functional status.  Hyperlipidemia Interventions:  (Status:  New goal.) Long Term Goal Lab Results  Component Value Date   CHOL 305 (H)  08/20/2020   HDL 73 08/20/2020   LDLCALC 225 (H) 08/20/2020   TRIG 54 08/20/2020   CHOLHDL 4.2 08/20/2020    Medications reviewed Reviewed provider established cholesterol goals Discussed importance of regular laboratory monitoring as prescribed Discussed strategies to manage statin-induced myalgias Reviewed importance of limiting foods high in cholesterol   Hypertension Interventions:  (Status:  New goal.) Long Term Goal Last practice recorded BP readings:  BP Readings from Last 3 Encounters:  12/15/20 127/73  12/14/20 114/70  12/09/20 (!) 155/72  Most recent eGFR/CrCl:  Lab Results  Component Value Date   EGFR 92 11/10/2020    No components found for: CRCL  Reviewed medications. Daughter is assisting with medication preparation. Advised to continue preparing as prescribed. Advised to notify a provider if patient is unable to tolerate the regimen.  Provided information regarding established blood pressure parameters along with indications for notifying a provider. Encouraged to monitor and record readings. Discussed compliance with recommended cardiac prudent diet. Encouraged to read nutrition labels, monitor sodium intake, and avoid highly processed foods when possible. Reviewed s/sx of  heart attack, stroke and worsening symptoms that require immediate medical attention.   Patient Goals/Self-Care Activities: Take all medications as prescribed Attend all scheduled provider appointments Perform all self care activities independently    Follow Up Plan:   Will follow up in two months      PLAN Will follow up in two months.   [facility]y [facility] Care Management [facility] [phone]

## 2020-12-28 NOTE — Patient Instructions (Addendum)
Thank you for allowing the Chronic Care Management team to participate in your care. It was great speaking with you today! Please don't hesitate to contact me if I can be of assistance before our next telephone outreach.   Patient Care Plan: RN Care Management Plan of Care     Problem Identified: COPD, HTN, HLD, DM, and Fall Risk      Long-Range Goal: Disease Progression Prevented or Minimized   Start Date: 12/28/2020  Expected End Date: 03/28/2021  Priority: High  Note:   Current Barriers:  Chronic Disease Management support and education needs related to HTN, HLD, COPD, DMII, and Fall Risk.  RNCM Clinical Goal(s):  Patient will demonstrate Ongoing adherence to prescribed treatment plan for HTN, HLD, COPD, DMII, and Fall Risk through collaboration with the provider, RN Care manager, and the care team.   Interventions: 1:1 collaboration with primary care provider regarding development and update of comprehensive plan of care as evidenced by provider attestation and co-signature Inter-disciplinary care team collaboration (see longitudinal plan of care) Evaluation of current treatment plan related to  self management and patient's adherence to plan as established by provider   COPD Interventions:  (Status:  New goal.) Long Term Goal Reviewed medications and plan for COPD management with patient's daughter. Discussed triggers and concerns related to weather changes. Daughter reports symptoms have been well controlled. She has not required frequent use of albuterol. Reports she is currently using supplemental oxygen at 4L/min as needed.  Reviewed current action plan and reinforced importance of daily self-assessment. Daughter advised to make an appointment with a provider if Ms. Postel experiences moderate symptoms for greater than 48 hours without improvement.  Provided information regarding infection prevention and increased risk r/t COPD. Advised to utilize prevention strategies to  reduce risk of respiratory infection  Reviewed worsening symptoms that require immediate medical attention.    Diabetes Interventions:  (Status:  New goal.) Long Term Goal Assessed patient's understanding of A1c goal: <7% Lab Results  Component Value Date   HGBA1C 5.7 (H) 08/20/2020  Reviewed plan for diabetes management. Her diabetes is well management. A1C is currently at goal. Daughter is assisting with meals and reports significant improvement with adhering to the recommended ADA diet. Declined need for additional diabetic resources. Discussed importance of completing recommended DM preventive care. Daughter reports exams are up to date.   Falls Interventions:  (Status:  New goal.) Long Term Goal Fall Risk 09/28/2020 11/10/2020 12/09/2020 12/15/2020 12/28/2020  Falls in the past year? - 1 - 1 1  Was there an injury with Fall? - 0 - 1 1  Fall Risk Category Calculator - 1 - 2 2  Fall Risk Category - Low - Moderate Moderate  Patient Fall Risk Level Moderate fall risk Low fall risk Low fall risk Low fall risk Moderate fall risk  Patient at Risk for Falls Due to - - - History of fall(s) History of fall(s);Impaired balance/gait  Fall risk Follow up - - - Falls evaluation completed Falls prevention discussed    Reviewed medications and discussed potential side effects. Provided information regarding safety and fall prevention. Daughter reports she is currently ambulating well in the home. She has a walker to use when needed. Reports that her activity tolerance has improved. Mostly requires an assistive device if ambulating for prolonged periods.  Discussed ability to perform ADL's and IADL's. Daughter reports that she requires assistance getting in and out of the shower d/t increased risk of slipping and falling but performs ADL's independently.  Reports she can perform light tasks such as sweeping, washing dishes, and folding laundry independently. She has good support. Daughter will keep the care  management team updated of functional status.  Hyperlipidemia Interventions:  (Status:  New goal.) Long Term Goal Lab Results  Component Value Date   CHOL 305 (H) 08/20/2020   HDL 73 08/20/2020   LDLCALC 225 (H) 08/20/2020   TRIG 54 08/20/2020   CHOLHDL 4.2 08/20/2020    Medications reviewed Reviewed provider established cholesterol goals Discussed importance of regular laboratory monitoring as prescribed Discussed strategies to manage statin-induced myalgias Reviewed importance of limiting foods high in cholesterol   Hypertension Interventions:  (Status:  New goal.) Long Term Goal Last practice recorded BP readings:  BP Readings from Last 3 Encounters:  12/15/20 127/73  12/14/20 114/70  12/09/20 (!) 155/72  Most recent eGFR/CrCl:  Lab Results  Component Value Date   EGFR 92 11/10/2020    No components found for: CRCL  Reviewed medications. Daughter is assisting with medication preparation. Advised to continue preparing as prescribed. Advised to notify a provider if patient is unable to tolerate the regimen.  Provided information regarding established blood pressure parameters along with indications for notifying a provider. Encouraged to monitor and record readings. Discussed compliance with recommended cardiac prudent diet. Encouraged to read nutrition labels, monitor sodium intake, and avoid highly processed foods when possible. Reviewed s/sx of heart attack, stroke and worsening symptoms that require immediate medical attention.   Patient Goals/Self-Care Activities: Take all medications as prescribed Attend all scheduled provider appointments Perform all self care activities independently    Follow Up Plan:   Will follow up in two months     Consent to CCM Services: Ms. Allerton caregiver/daughter was given information about Chronic Care Management services including:  CCM service includes personalized support from designated clinical staff supervised by her  physician, including individualized plan of care and coordination with other care providers 24/7 contact phone numbers for assistance for urgent and routine care needs. Service will only be billed when office clinical staff spend 20 minutes or more in a month to coordinate care. Only one practitioner may furnish and bill the service in a calendar month. The patient may stop CCM services at any time (effective at the end of the month) by phone call to the office staff. The patient will be responsible for cost sharing (co-pay) of up to 20% of the service fee (after annual deductible is met).  Patient's caregiver/daughter agreed to services and verbal consent obtained.   Ms. Klinedinst caregiver/daughter verbalized understanding of the information discussed during the telephonic outreach. Declined need for mailed/printed instructions.  A member of the care management team will follow up in two months.   [facility] Care Management [facility] [phone]

## 2020-12-30 ENCOUNTER — Ambulatory Visit
Admission: RE | Admit: 2020-12-30 | Discharge: 2020-12-30 | Disposition: A | Discharge status: Home / Self Care | Payer: Medicare Other | Source: Ambulatory Visit | Attending: Radiation Oncology | Admitting: Radiation Oncology

## 2020-12-30 ENCOUNTER — Other Ambulatory Visit: Payer: Self-pay

## 2020-12-30 ENCOUNTER — Ambulatory Visit: Payer: Self-pay | Admitting: *Deleted

## 2020-12-30 DIAGNOSIS — C3412 Malignant neoplasm of upper lobe, left bronchus or lung: Secondary | ICD-10-CM | POA: Diagnosis present

## 2020-12-30 LAB — POCT I-STAT CREATININE: Creatinine, Ser: 0.6 mg/dL (ref 0.44–1.00)

## 2020-12-30 MED ORDER — IOHEXOL 300 MG/ML  SOLN
75.0000 mL | Freq: Once | INTRAMUSCULAR | Status: AC | PRN
  Administered 2020-12-30: 15:00:00 75 mL via INTRAVENOUS

## 2020-12-30 NOTE — Telephone Encounter (Signed)
Summary: urinary discomfort   The patient would like to be contacted to discuss urinary discomfort they've experienced since Monday 12/28/20   The patient feels a sensation as if a ball is coming out of them when they use the restroom   The patient shares that there is no burning or pressure when they use the restroom but the sensation of a ball coming out   Please contact further when possible      Reason for Disposition  Feels like something inside is falling out of vagina (e.g., pressure, heaviness, fullness)  Answer Assessment - Initial Assessment Questions 1. SYMPTOM: "What's the main symptom you're concerned about?" (e.g., frequency, incontinence)     When she uses the bathroom- has lump that come out of the vagina 2. ONSET: "When did the  pressure  start?"     2 days 3. PAIN: "Is there any pain?" If Yes, ask: "How bad is it?" (Scale: 1-10; mild, moderate, severe)     no 4. CAUSE: "What do you think is causing the symptoms?"     no 5. OTHER SYMPTOMS: "Do you have any other symptoms?" (e.g., fever, flank pain, blood in urine, pain with urination)     Interferes with urinary steam 6. PREGNANCY: "Is there any chance you are pregnant?" "When was your last menstrual period?"     na  Answer Assessment - Initial Assessment Questions 1. SYMPTOM: "What's the main symptom you're concerned about?" (e.g., pain, itching, dryness)     Bulge at vaginal opening 2. LOCATION: "Where is the bulge located?" (e.g., inside/outside, left/right)     Opening of vagina 3. ONSET: "When did the  bulge  start?"     2 days 4. PAIN: "Is there any pain?" If Yes, ask: "How bad is it?" (Scale: 1-10; mild, moderate, severe)     no 5. ITCHING: "Is there any itching?" If Yes, ask: "How bad is it?" (Scale: 1-10; mild, moderate, severe)     no 6. CAUSE: "What do you think is causing the discharge?" "Have you had the same problem before? What happened then?"     prolapse 7. OTHER SYMPTOMS: "Do you have any  other symptoms?" (e.g., fever, itching, vaginal bleeding, pain with urination, injury to genital area, vaginal foreign body)     no 8. PREGNANCY: "Is there any chance you are pregnant?" "When was your last menstrual period?"     na  Protocols used: Urinary Symptoms-A-AH, Vaginal Symptoms-A-AH

## 2020-12-30 NOTE — Telephone Encounter (Signed)
°  Chief Complaint: bulge at vaginal opening Symptoms: comes out with urination, interferes with urine stream Frequency: 2 days Pertinent Negatives: Patient denies pain, urinary retention  Disposition: [] ED /[] Urgent Care (no appt availability in office) / [x] Appointment(In office/virtual)/ []  [place] [facility]/ [] Home Care/ [] Refused Recommended Disposition  Additional Notes:

## 2021-01-05 ENCOUNTER — Other Ambulatory Visit: Payer: Self-pay

## 2021-01-05 ENCOUNTER — Encounter: Payer: Self-pay | Admitting: Family Medicine

## 2021-01-05 ENCOUNTER — Ambulatory Visit (INDEPENDENT_AMBULATORY_CARE_PROVIDER_SITE_OTHER): Payer: Medicare Other | Admitting: Family Medicine

## 2021-01-05 VITALS — BP 132/75 | HR 54 | Temp 97.8°F | Resp 16 | Wt 188.6 lb

## 2021-01-05 DIAGNOSIS — M545 Low back pain, unspecified: Secondary | ICD-10-CM

## 2021-01-05 DIAGNOSIS — N8110 Cystocele, unspecified: Secondary | ICD-10-CM | POA: Insufficient documentation

## 2021-01-05 MED ORDER — MELOXICAM 7.5 MG PO TABS: 7.5000 mg | ORAL_TABLET | Freq: Every day | ORAL | 0 refills | Status: DC

## 2021-01-05 NOTE — Assessment & Plan Note (Signed)
Refer to GYN for evaluation for surgery vs pessary. Could also benefit from pelvic floor therapy.

## 2021-01-05 NOTE — Progress Notes (Signed)
° °  SUBJECTIVE:   CHIEF COMPLAINT / HPI:   VAGINAL BULGE - feels like something comes out when she urinates or has BM. Often feels she has to push back in. - duration: 2-3 weeks - denies change in bowel habits but does have h/o constipation and regularly takes laxatives. No blood in stool.  - denies new abd pain, N/V, fevers, vaginal bleeding/discharge, dysuria, increased urinary frequency - 3 prior vaginal deliveries. S/p abd hysterectomy 1960s, bladder tack, appendectomy.    OBJECTIVE:   BP 132/75 (BP Location: Right Arm, Patient Position: Sitting, Cuff Size: Large)    Pulse (!) 54    Temp 97.8 F (36.6 C) (Oral)    Resp 16    Wt 188 lb 9.6 oz (85.5 kg)    BMI 31.38 kg/m   Gen: well appearing, in NAD GYN:  External genitalia within normal limits.  Vaginal mucosa pink, moist, normal rugae with prolapse into vaginal introitus.  No discharge or bleeding noted.    ASSESSMENT/PLAN:   Vaginal prolapse Refer to GYN for evaluation for surgery vs pessary. Could also benefit from pelvic floor therapy.      Myles Gip, DO

## 2021-01-07 ENCOUNTER — Ambulatory Visit
Admission: RE | Admit: 2021-01-07 | Discharge: 2021-01-07 | Disposition: A | Discharge status: Home / Self Care | Payer: Medicare Other | Source: Ambulatory Visit | Attending: Radiation Oncology | Admitting: Radiation Oncology

## 2021-01-07 ENCOUNTER — Encounter: Payer: Self-pay | Admitting: Radiation Oncology

## 2021-01-07 ENCOUNTER — Other Ambulatory Visit: Payer: Self-pay

## 2021-01-07 NOTE — Progress Notes (Signed)
Radiation Oncology Follow up Note  Name: Samantha Henry   Date:   01/07/2021 MRN:  [id number] DOB: 1945-03-13    This 75 y.o. female presents to the clinic today for review of results of CT scan and patient treated over 2 years prior to her left upper lobe for stage I non-small cell lung cancer with SBRT.  REFERRING PROVIDER: Mikey Kirschner, PA-C  HPI: Patient is a 75 year old female now at over 2 years having pleated SBRT to her left upper lobe for stage I non-small cell lung cancer.  She been lost to follow-up return to our clinic recently.  She lives with her daughter who accompanies her today.  She is having some left sided chest pain although had a fall in the bathroom recently and did bang that side of her body.  I recently ordered a CT scan.  Of her chest showing reduced overall volume in the area of previous prior non-small cell lung cancer left upper lobe although there was some increased density around this periphery of this lesion.  There are also extend to the pleural margin and there was a new subacute fracture of the left third rib without overt underlying lesion or bony destruction to suggest malignant rib involvement.  COMPLICATIONS OF TREATMENT: none  FOLLOW UP COMPLIANCE: keeps appointments   PHYSICAL EXAM:  BP (!) 122/47    Pulse 73    Temp (!) 97.4 F (36.3 C) (Tympanic)    Wt 188 lb 4.8 oz (85.4 kg)    BMI 31.33 kg/m  Well-developed well-nourished patient in NAD. HEENT reveals PERLA, EOMI, discs not visualized.  Oral cavity is clear. No oral mucosal lesions are identified. Neck is clear without evidence of cervical or supraclavicular adenopathy. Lungs are clear to A&P. Cardiac examination is essentially unremarkable with regular rate and rhythm without murmur rub or thrill. Abdomen is benign with no organomegaly or masses noted. Motor sensory and DTR levels are equal and symmetric in the upper and lower extremities. Cranial nerves II through XII are grossly intact.  Proprioception is intact. No peripheral adenopathy or edema is identified. No motor or sensory levels are noted. Crude visual fields are within normal range.  RADIOLOGY RESULTS: Serial CT scans reviewed PET CT scan ordered  PLAN: This time of ordered a PET CT scan for better delineation of possible recurrent disease in her left chest.  I have ordered her in follow-up after the PET CT scan is performed in mid January.  Should there be hypermetabolic activity would consider salvage radiation therapy at that time.  Patient and daughter both comprehend my recommendations well.  PET CT scan and follow-up appointments were arranged.  I would like to take this opportunity to thank you for allowing me to participate in the care of your patient.Noreene Filbert, MD

## 2021-01-09 DIAGNOSIS — J449 Chronic obstructive pulmonary disease, unspecified: Secondary | ICD-10-CM

## 2021-01-09 DIAGNOSIS — I10 Essential (primary) hypertension: Secondary | ICD-10-CM

## 2021-01-09 DIAGNOSIS — E1149 Type 2 diabetes mellitus with other diabetic neurological complication: Secondary | ICD-10-CM

## 2021-01-12 ENCOUNTER — Other Ambulatory Visit: Payer: Self-pay

## 2021-01-12 ENCOUNTER — Ambulatory Visit (INDEPENDENT_AMBULATORY_CARE_PROVIDER_SITE_OTHER): Payer: Medicare Other | Admitting: Physician Assistant

## 2021-01-12 ENCOUNTER — Encounter: Payer: Self-pay | Admitting: Physician Assistant

## 2021-01-12 ENCOUNTER — Other Ambulatory Visit: Payer: Self-pay | Admitting: Physician Assistant

## 2021-01-12 VITALS — BP 121/52 | HR 59 | Wt 189.5 lb

## 2021-01-12 DIAGNOSIS — R32 Unspecified urinary incontinence: Secondary | ICD-10-CM | POA: Diagnosis not present

## 2021-01-12 DIAGNOSIS — R350 Frequency of micturition: Secondary | ICD-10-CM | POA: Diagnosis not present

## 2021-01-12 DIAGNOSIS — N3000 Acute cystitis without hematuria: Secondary | ICD-10-CM | POA: Diagnosis not present

## 2021-01-12 LAB — POCT URINALYSIS DIPSTICK
Bilirubin, UA: NEGATIVE
Blood, UA: NEGATIVE
Glucose, UA: NEGATIVE
Ketones, UA: NEGATIVE
Nitrite, UA: NEGATIVE
Protein, UA: NEGATIVE
Spec Grav, UA: 1.01 (ref 1.010–1.025)
Urobilinogen, UA: 0.2 E.U./dL
pH, UA: 5 (ref 5.0–8.0)

## 2021-01-12 MED ORDER — NITROFURANTOIN MONOHYD MACRO 100 MG PO CAPS: 100.0000 mg | ORAL_CAPSULE | Freq: Two times a day (BID) | ORAL | 0 refills | Status: AC

## 2021-01-12 NOTE — Progress Notes (Signed)
Established patient visit   Patient: Samantha Henry   DOB: October 10, 1945   76 y.o. Female  MRN: [id number] Visit Date: 01/12/2021  Today's healthcare provider: Mikey Kirschner, PA-C   Cc. Lower abdominal pain, urinary frequency/urgency  Subjective    HPI  Samantha Henry is a 76 y/o female who presents today with concerns of lower abdominal pressure, urinary urgency and frequency x 2 weeks. She was seen 12/27 with similar concerns as well as the concerns that she has to push something back after she urinates. She is scheduled to see an OB/GYN 01/15/2021.   Accompanied today by her daughter.  Her daughter mentions that this past Sunday they were seen at [facility] emergency room as her mother was having chest pain, reports a full work-up was negative but is scheduled for an echocardiogram.  They are also scheduled soon for a PET scan as she has a new lung nodule and a fractured rib.  Medications: Outpatient Medications Prior to Visit  Medication Sig   acetaminophen (TYLENOL) 500 MG tablet Take 500 mg by mouth every 8 (eight) hours as needed.   albuterol (VENTOLIN HFA) 108 (90 Base) MCG/ACT inhaler Inhale into the lungs every 6 (six) hours as needed for wheezing or shortness of breath.   atorvastatin (LIPITOR) 40 MG tablet Take 1 tablet (40 mg total) by mouth daily.   calcium carbonate (TUMS EX) 750 MG chewable tablet Chew 2 tablets by mouth 2 (two) times daily as needed for heartburn.   cyclobenzaprine (FLEXERIL) 5 MG tablet Take 1 tablet (5 mg total) by mouth 3 (three) times daily as needed for muscle spasms.   donepezil (ARICEPT) 10 MG tablet Take 1 tablet (10 mg total) by mouth at bedtime.   fluticasone (FLONASE) 50 MCG/ACT nasal spray Place 2 sprays into both nostrils daily.   levothyroxine (EUTHYROX) 125 MCG tablet Take 1 tablet (125 mcg total) by mouth daily.   losartan (COZAAR) 25 MG tablet Take 2 tablets (50 mg total) by mouth daily.   meloxicam (MOBIC) 7.5 MG tablet Take 1 tablet  (7.5 mg total) by mouth daily.   OXYGEN Inhale 3 L into the lungs. Used at night and PRN during the day   pantoprazole (PROTONIX) 40 MG tablet Take 1 tablet by mouth once daily   Peppermint Oil (IBGARD PO) Take by mouth as needed.   Polyethylene Glycol 3350 (MIRALAX PO) Take by mouth as needed.   potassium chloride SA (KLOR-CON) 20 MEQ tablet Take 1 tablet by mouth once daily   traMADol (ULTRAM) 50 MG tablet Take 1 tablet (50 mg total) by mouth every 12 (twelve) hours as needed.   TRELEGY ELLIPTA 100-62.5-25 MCG/INH AEPB INHALE 1 PUFF ONCE DAILY   No facility-administered medications prior to visit.    Review of Systems  Constitutional:  Negative for fatigue and fever.  Gastrointestinal:  Positive for abdominal pain.  Endocrine: Positive for polyuria.  Genitourinary:  Positive for dysuria and frequency.      Objective    Blood pressure (!) 121/52, pulse (!) 59, weight 189 lb 8 oz (86 kg), SpO2 98 %.   Physical Exam Constitutional:      General: She is awake.     Appearance: She is well-developed.  HENT:     Head: Normocephalic.  Eyes:     Conjunctiva/sclera: Conjunctivae normal.  Cardiovascular:     Rate and Rhythm: Normal rate and regular rhythm.     Heart sounds: Normal heart sounds.  Pulmonary:  Effort: Pulmonary effort is normal.     Breath sounds: Normal breath sounds.  Skin:    General: Skin is warm.  Neurological:     Mental Status: She is alert and oriented to person, place, and time.  Psychiatric:        Attention and Perception: Attention normal.        Mood and Affect: Mood normal.        Speech: Speech normal.        Behavior: Behavior is cooperative.    Results for orders placed or performed in visit on 01/12/21  POCT Urinalysis Dipstick  Result Value Ref Range   Color, UA     Clarity, UA     Glucose, UA Negative Negative   Bilirubin, UA negative    Ketones, UA negative    Spec Grav, UA 1.010 1.010 - 1.025   Blood, UA negative    pH, UA 5.0 5.0  - 8.0   Protein, UA Negative Negative   Urobilinogen, UA 0.2 0.2 or 1.0 E.U./dL   Nitrite, UA negative    Leukocytes, UA Moderate (2+) (A) Negative   Appearance     Odor      Assessment & Plan     1.  Acute cystitis UA + for leukocytes, - for blood Culture  Rx Macrobid twice daily for 5 days. Advise she increase fluids Continue with GYN follow up Return if symptoms worsen or fail to improve, as scheduled.      I, Mikey Kirschner, PA-C have reviewed all documentation for this visit. The documentation on  01/12/2021 for the exam, diagnosis, procedures, and orders are all accurate and complete.    Mikey Kirschner, PA-C  [facility] [phone] (phone) [phone] (fax)  [place]

## 2021-01-13 ENCOUNTER — Other Ambulatory Visit: Payer: Self-pay | Admitting: Physician Assistant

## 2021-01-13 ENCOUNTER — Other Ambulatory Visit: Payer: Self-pay | Admitting: *Deleted

## 2021-01-13 DIAGNOSIS — C3412 Malignant neoplasm of upper lobe, left bronchus or lung: Secondary | ICD-10-CM

## 2021-01-13 DIAGNOSIS — R413 Other amnesia: Secondary | ICD-10-CM

## 2021-01-14 NOTE — Telephone Encounter (Signed)
LOV:  01/12/2021  NOV: None  Last Refill:  03/18/2020  #30 5 Refills  Thanks,   -Mickel Baas

## 2021-01-15 ENCOUNTER — Encounter: Payer: Self-pay | Admitting: Obstetrics & Gynecology

## 2021-01-15 ENCOUNTER — Other Ambulatory Visit: Payer: Self-pay

## 2021-01-15 ENCOUNTER — Ambulatory Visit: Payer: Medicare Other | Admitting: Obstetrics & Gynecology

## 2021-01-15 VITALS — BP 130/80 | Ht 66.0 in | Wt 185.0 lb

## 2021-01-15 DIAGNOSIS — N816 Rectocele: Secondary | ICD-10-CM | POA: Diagnosis not present

## 2021-01-15 DIAGNOSIS — N8110 Cystocele, unspecified: Secondary | ICD-10-CM

## 2021-01-15 NOTE — Progress Notes (Signed)
Cystocele/Rectocele Patient complains of a rectocele and vaginal prolapse . Problem started a few weeks ago. Symptoms include: prolapse of tissue with straining and discomfort: mild. Symptoms have gradually worsened.  She does not have incontinence of stool, or urinary sx's.  No bleeding.  Prior hyst many years ago.  Also has had prior bladder tack type surgery.  Prior NSVD x3.  PMHx: She  has a past medical history of Arthritis, Cancer ([place]) (1969), COPD (chronic obstructive pulmonary disease) ([place]), Diabetes mellitus without complication ([place]), Diabetic peripheral neuropathy ([place]), Environmental and seasonal allergies, GERD (gastroesophageal reflux disease), History of cervical cancer, History of colon polyps, Hypercholesteremia, Hypertension, Hypothyroidism, Nail dystrophy, Neuropathy, Proteinuria, Restless legs syndrome, Sleep apnea, TIA (transient ischemic attack), and Von Willebrand's disease. Also,  has a past surgical history that includes Tendon release (2010); Carpal tunnel release (2010); Bladder surgery (1997); Appendectomy; colonoscopy and upper endoscopy'; Cardiac catheterization; Colonoscopy with propofol (N/A, 02/25/2016); Total knee arthroplasty (Left, 06/14/2016); Oophorectomy; Abdominal hysterectomy (1969); Breast biopsy (Left, 01/21/2015); Joint replacement (Left, 06/14/2016); Muscle biopsy; Colonoscopy with propofol (N/A, 03/22/2019); and Esophagogastroduodenoscopy (egd) with propofol (N/A, 03/22/2019)., family history includes Alzheimer's disease in her mother; Breast cancer in her maternal aunt; Cancer in her father; Cancer (age of onset: 15) in her sister; Colon cancer in her sister; Diabetes in her brother and sister; Hodgkin's lymphoma in her sister.,  reports that she quit smoking about 14 years ago. Her smoking use included cigarettes. She has a 72.00 pack-year smoking history. She has never used smokeless tobacco. She reports that she does not drink alcohol and does not use  drugs.  She has a current medication list which includes the following prescription(s): acetaminophen, albuterol, atorvastatin, calcium carbonate, cyclobenzaprine, donepezil, fluticasone, levothyroxine, losartan, meloxicam, nitrofurantoin (macrocrystal-monohydrate), oxygen-helium, pantoprazole, peppermint oil, polyethylene glycol 3350, potassium chloride sa, tramadol, and trelegy ellipta. Also, is allergic to aspirin, gabapentin, lyrica [pregabalin], and tetracycline.  Review of Systems  Constitutional:  Negative for chills, fever and malaise/fatigue.  HENT:  Negative for congestion, sinus pain and sore throat.   Eyes:  Negative for blurred vision and pain.  Respiratory:  Negative for cough and wheezing.   Cardiovascular:  Negative for chest pain and leg swelling.  Gastrointestinal:  Negative for abdominal pain, constipation, diarrhea, heartburn, nausea and vomiting.  Genitourinary:  Negative for dysuria, frequency, hematuria and urgency.  Musculoskeletal:  Negative for back pain, joint pain, myalgias and neck pain.  Skin:  Negative for itching and rash.  Neurological:  Negative for dizziness, tremors and weakness.  Endo/Heme/Allergies:  Does not bruise/bleed easily.  Psychiatric/Behavioral:  Negative for depression. The patient is not nervous/anxious and does not have insomnia.    Objective: BP 130/80    Ht 5\' 6"  (1.676 m)    Wt 185 lb (83.9 kg)    BMI 29.86 kg/m  Physical Exam Constitutional:      General: She is not in acute distress.    Appearance: She is well-developed.  Genitourinary:     Bladder and urethral meatus normal.     Genitourinary Comments: No urethral hypermobility     Vaginal cuff intact.    No vaginal erythema or bleeding.     Posterior and apical vaginal prolapse present.    Mild vaginal atrophy present.     Right Adnexa: not tender and no mass present.    Left Adnexa: not tender and no mass present.    Cervix is absent.     Uterus is absent.     Bladder exam  comments: Min  cystocele.     Pelvic exam was performed with patient in the lithotomy position.  Rectum:     Rectal exam comments: Gr 2 rectocele.  HENT:     Head: Normocephalic and atraumatic.     Nose: Nose normal.  Abdominal:     General: There is no distension.     Palpations: Abdomen is soft.     Tenderness: There is no abdominal tenderness.  Musculoskeletal:        General: Normal range of motion.  Neurological:     Mental Status: She is alert and oriented to person, place, and time.     Cranial Nerves: No cranial nerve deficit.  Skin:    General: Skin is warm and dry.  Psychiatric:        Attention and Perception: Attention normal.        Mood and Affect: Mood normal.        Speech: Speech normal.        Behavior: Behavior normal.        Cognition and Memory: Cognition normal.        Judgment: Judgment normal.    ASSESSMENT/PLAN:   Problem List Items Addressed This Visit       Digestive   Rectocele - Primary     Genitourinary   Vaginal prolapse  Pessary management vs surgery discussed  Pessary Fitting Patient presents for a pessary fitting. She desires a pessary as her means of controlling her symptoms of prolapse and/or urinary incontinence. She understands the care needed for a pessary and desires to proceed. Alternative treatment options have been discussed at length and the patient voices an understanding of each option.   PROCEDURE: The patient was placed in dorsal lithotomy position. Examination confirmed prolapse. A #1 ring type pessary was fitted without difficulty. The patient subsequently ambulated, voided and performed valsalva maneuvers without dislodging the pessary and without discomfort. Care instructions were provided. Patient was discharged to home in stable condition.    Barnett Applebaum, MD, [facility], [facility] 01/15/2021  4:11 PM

## 2021-01-15 NOTE — Patient Instructions (Signed)
Vaginal Pessary A vaginal pessary is a removable device that is placed into your vagina to support pelvic organs that droop. These organs include your uterus, bladder, and rectum. When your pelvic organs drop down into your vagina, it causes a condition called pelvic organ prolapse (POP). A pessary may be an alternative to surgery for women with POP. It may help women who leak urine when they strain or exercise (stress incontinence). This is a symptom of POP. A vaginal pessary may also be a temporary treatment for stress incontinence during pregnancy. There are several types of pessaries. All types are usually made of silicone. You can insert and remove some on your own. Other types must be inserted and removed by your health care provider at office visits. The reason you are using a pessary and the severity of your condition will determine which one is best for you. It is also important to find the right size. A pessary that is too small may fall out. A pessary that is too large may cause pain or discomfort. Your health care provider will do a physical exam to find the correct size and fit for your pessary. It may take several appointments to find the best fit for you. If you can be fit with the type of pessary that you can insert, remove, and clean yourself, your health care provider will teach you how to use your pessary at home. You may have checkups every few months. If you have the type of pessary that needs to be inserted and removed by your health care provider, you will have appointments every few months to have the pessary removed, cleaned, and replaced. What are the risks? When properly fitted and cared for, risks of using a vaginal pessary can be small. However, there can be problems that may include: Vaginal discharge. Vaginal bleeding. A bad smell coming from your vagina. Scraping of the skin inside your vagina. How to use your pessary Follow your health care provider's instructions for  using a pessary. These instructions may vary, depending on the type of pessary you have. To insert a pessary: Wash your hands with soap and water for at least 20 seconds. Squeeze or fold the pessary in half and lubricate the tip with a water-based lubricant. Insert the pessary into your vagina. It will unfold and provide support. To remove the pessary, gently tug it out of your vagina. You can remove the pessary every night or after several days. You can also remove it to have sex. How to care for your pessary If you have a pessary that you can remove: Clean your pessary with soap and water. Rinse well. Dry it completely before inserting it back into your vagina. Follow these instructions at home: Take over-the-counter and prescription medicines only as told by your health care provider. Your health care provider may prescribe an estrogen cream to moisten your vagina. Keep all follow-up visits. This is important. Contact a health care provider if: You feel any pain or discomfort when your pessary is in place. You continue to have stress incontinence. You have trouble keeping your pessary from falling out. You have an unusual vaginal discharge that is blood-tinged or smells bad. Summary A vaginal pessary is a removable device that is placed into your vagina to support pelvic organs that droop. This condition is called pelvic organ prolapse (POP). There are several types of pessaries. Some you can insert and remove on your own. Others must be inserted and removed by your health care  provider. The best type for you depends on the reason you are using a pessary and the severity of your condition. It is also important to find the right size. If you can use the type that you insert and remove on your own, your health care provider will teach you how to use it and schedule checkups every few months. If you have the type that needs to be inserted and removed by your health care provider, you will have  regular appointments to have your pessary removed, cleaned, and replaced. This information is not intended to replace advice given to you by your health care provider. Make sure you discuss any questions you have with your health care provider. Document Revised: 06/27/2019 Document Reviewed: 06/27/2019 Elsevier Patient Education  [place].

## 2021-01-19 ENCOUNTER — Other Ambulatory Visit: Payer: Self-pay | Admitting: Physician Assistant

## 2021-01-19 DIAGNOSIS — N3000 Acute cystitis without hematuria: Secondary | ICD-10-CM

## 2021-01-19 LAB — URINE CULTURE

## 2021-01-19 MED ORDER — CIPROFLOXACIN HCL 500 MG PO TABS: 500.0000 mg | ORAL_TABLET | Freq: Two times a day (BID) | ORAL | 0 refills | Status: AC

## 2021-01-21 ENCOUNTER — Other Ambulatory Visit: Payer: Self-pay

## 2021-01-21 ENCOUNTER — Ambulatory Visit (INDEPENDENT_AMBULATORY_CARE_PROVIDER_SITE_OTHER): Payer: Medicare Other

## 2021-01-21 DIAGNOSIS — R072 Precordial pain: Secondary | ICD-10-CM

## 2021-01-21 LAB — ECHOCARDIOGRAM COMPLETE
AR max vel: 1.35 cm2
AV Area VTI: 1.33 cm2
AV Area mean vel: 1.21 cm2
AV Mean grad: 16.8 mmHg
AV Peak grad: 26.2 mmHg
Ao pk vel: 2.56 m/s
Area-P 1/2: 2.47 cm2
Calc EF: 71 %
MV VTI: 2.73 cm2
S' Lateral: 2.9 cm
Single Plane A2C EF: 63.8 %
Single Plane A4C EF: 77.3 %

## 2021-01-27 ENCOUNTER — Ambulatory Visit
Admission: RE | Admit: 2021-01-27 | Discharge: 2021-01-27 | Disposition: A | Discharge status: Home / Self Care | Payer: Medicare Other | Source: Ambulatory Visit | Attending: Radiation Oncology | Admitting: Radiation Oncology

## 2021-01-27 ENCOUNTER — Other Ambulatory Visit: Payer: Self-pay

## 2021-01-27 DIAGNOSIS — K5730 Diverticulosis of large intestine without perforation or abscess without bleeding: Secondary | ICD-10-CM | POA: Insufficient documentation

## 2021-01-27 DIAGNOSIS — C3412 Malignant neoplasm of upper lobe, left bronchus or lung: Secondary | ICD-10-CM

## 2021-01-27 DIAGNOSIS — Z923 Personal history of irradiation: Secondary | ICD-10-CM | POA: Insufficient documentation

## 2021-01-27 LAB — GLUCOSE, CAPILLARY: Glucose-Capillary: 90 mg/dL (ref 70–99)

## 2021-01-27 MED ORDER — FLUDEOXYGLUCOSE F - 18 (FDG) INJECTION
9.6000 | Freq: Once | INTRAVENOUS | Status: AC | PRN
  Administered 2021-01-27: 9.8 via INTRAVENOUS

## 2021-02-04 ENCOUNTER — Ambulatory Visit
Admission: RE | Admit: 2021-02-04 | Discharge: 2021-02-04 | Disposition: A | Discharge status: Home / Self Care | Payer: Medicare Other | Source: Ambulatory Visit | Attending: Radiation Oncology | Admitting: Radiation Oncology

## 2021-02-04 ENCOUNTER — Other Ambulatory Visit: Payer: Self-pay

## 2021-02-04 ENCOUNTER — Other Ambulatory Visit: Payer: Self-pay | Admitting: *Deleted

## 2021-02-04 ENCOUNTER — Other Ambulatory Visit: Payer: Self-pay | Admitting: Family Medicine

## 2021-02-04 VITALS — BP 132/71 | HR 64 | Temp 97.7°F | Resp 16 | Wt 187.2 lb

## 2021-02-04 DIAGNOSIS — Z85118 Personal history of other malignant neoplasm of bronchus and lung: Secondary | ICD-10-CM | POA: Diagnosis present

## 2021-02-04 DIAGNOSIS — Z923 Personal history of irradiation: Secondary | ICD-10-CM | POA: Diagnosis not present

## 2021-02-04 DIAGNOSIS — C3412 Malignant neoplasm of upper lobe, left bronchus or lung: Secondary | ICD-10-CM

## 2021-02-04 DIAGNOSIS — M545 Low back pain, unspecified: Secondary | ICD-10-CM

## 2021-02-04 NOTE — Telephone Encounter (Signed)
Requested medication (s) are due for refill today: yes  Requested medication (s) are on the active medication list: yes  Last refill:  01/05/21 for #30  Future visit scheduled: no  Notes to clinic:  unclear if this med was to be continued, it was ordered for an acute pain, please assess.  Requested Prescriptions  Pending Prescriptions Disp Refills   meloxicam (MOBIC) 7.5 MG tablet [Pharmacy Med Name: Meloxicam 7.5 MG Oral Tablet] 30 tablet 0    Sig: Take 1 tablet by mouth once daily     Analgesics:  COX2 Inhibitors Passed - 02/04/2021  5:30 AM      Passed - HGB in normal range and within 360 days    Hemoglobin  Date Value Ref Range Status  08/20/2020 12.9 11.1 - 15.9 g/dL Final          Passed - Cr in normal range and within 360 days    Creat  Date Value Ref Range Status  12/23/2016 0.58 (L) 0.60 - 0.93 mg/dL Final    Comment:    For patients >7 years of age, the reference limit for Creatinine is approximately 13% higher for people identified as African-American. .    Creatinine, Ser  Date Value Ref Range Status  12/30/2020 0.60 0.44 - 1.00 mg/dL Final   Creatinine, POC  Date Value Ref Range Status  01/21/2016 n/a mg/dL Final          Passed - Patient is not pregnant      Passed - Valid encounter within last 12 months    Recent Outpatient Visits           3 weeks ago Urinary frequency   [facility] Mikey Kirschner, PA-C   1 month ago Vaginal prolapse   [place], DO   1 month ago Primary hypertension   [facility] Mikey Kirschner, PA-C   1 month ago Status post fall   [facility] Tally Joe T, FNP   2 months ago Chronic bilateral low back pain without sciatica   [facility] Mikey Kirschner, PA-C       Future Appointments             In 2 weeks Agbor-Etang, Aaron Edelman, MD [facility], [place]

## 2021-02-04 NOTE — Progress Notes (Signed)
Radiation Oncology Follow up Note  Name: Samantha Henry   Date:   02/04/2021 MRN:  [id number] DOB: 10/23/1945    This 76 y.o. female presents to the clinic today for follow-up of recent PET CT scan in patient over 2 years out SBRT to her left upper lobe for stage I non-small cell lung cancer.  REFERRING PROVIDER: Mikey Kirschner, PA-C  HPI: Patient is a 76 year old female now out over 2 years having completed SBRT to her left upper lobe for stage I non-small cell lung cancer.  Her recent CT scan showed possibility of recurrence and we ordered a PET CT scan for better delineation.Marland Kitchen  PET/CT which I have reviewed showed low-level uptake in the identified posttreatment scarring of the left upper lobe in keeping with prior radiation therapy no suspicious hypermetabolic disease in the chest neck abdomen or pelvis.  COMPLICATIONS OF TREATMENT: none  FOLLOW UP COMPLIANCE: keeps appointments   PHYSICAL EXAM:  BP 132/71 (BP Location: Left Arm, Patient Position: Sitting, Cuff Size: Large)    Pulse 64    Temp 97.7 F (36.5 C) (Tympanic)    Resp 16    Wt 187 lb 3.2 oz (84.9 kg)    BMI 30.21 kg/m  Well-developed well-nourished patient in NAD. HEENT reveals PERLA, EOMI, discs not visualized.  Oral cavity is clear. No oral mucosal lesions are identified. Neck is clear without evidence of cervical or supraclavicular adenopathy. Lungs are clear to A&P. Cardiac examination is essentially unremarkable with regular rate and rhythm without murmur rub or thrill. Abdomen is benign with no organomegaly or masses noted. Motor sensory and DTR levels are equal and symmetric in the upper and lower extremities. Cranial nerves II through XII are grossly intact. Proprioception is intact. No peripheral adenopathy or edema is identified. No motor or sensory levels are noted. Crude visual fields are within normal range.  RADIOLOGY RESULTS: PET CT scans reviewed compatible with above-stated findings  PLAN: Present time  patient continues to do well with no evidence of disease verified by PET/CT have asked to see her back in 1 year with a follow-up CT scan at that time.  Patient knows to call with any concerns.  I would like to take this opportunity to thank you for allowing me to participate in the care of your patient.Noreene Filbert, MD

## 2021-02-18 ENCOUNTER — Other Ambulatory Visit: Payer: Self-pay

## 2021-02-18 ENCOUNTER — Encounter: Payer: Self-pay | Admitting: Physician Assistant

## 2021-02-18 ENCOUNTER — Ambulatory Visit (INDEPENDENT_AMBULATORY_CARE_PROVIDER_SITE_OTHER): Payer: Medicare Other | Admitting: Physician Assistant

## 2021-02-18 VITALS — BP 126/54 | HR 65 | Ht 65.0 in | Wt 187.7 lb

## 2021-02-18 DIAGNOSIS — G8929 Other chronic pain: Secondary | ICD-10-CM

## 2021-02-18 DIAGNOSIS — J069 Acute upper respiratory infection, unspecified: Secondary | ICD-10-CM | POA: Diagnosis not present

## 2021-02-18 DIAGNOSIS — M545 Low back pain, unspecified: Secondary | ICD-10-CM

## 2021-02-18 MED ORDER — BENZONATATE 100 MG PO CAPS: 100.0000 mg | ORAL_CAPSULE | Freq: Three times a day (TID) | ORAL | 0 refills | Status: AC | PRN

## 2021-02-18 MED ORDER — AZELASTINE HCL 0.1 % NA SOLN: 2.0000 | Freq: Two times a day (BID) | NASAL | 1 refills | Status: AC

## 2021-02-18 NOTE — Progress Notes (Signed)
I,Samantha Henry,acting as a Education administrator for [employer], PA-C.,have documented all relevant documentation on the behalf of Samantha Kirschner, PA-C,as directed by  Samantha Kirschner, PA-C while in the presence of Samantha Kirschner, PA-C.   Established patient visit   Patient: Samantha Henry   DOB: 21-May-1945   76 y.o. Female  MRN: [id number] Visit Date: 02/18/2021  Today's healthcare provider: Mikey Kirschner, PA-C   Cc. Cough, chronic back pain, headche  Subjective    HPI  Samantha Henry is a 76 y/o female accompanied today by her daughter who presents for cough, headache at the front of her head, nasal congestion x 4-5 days. She uses oxymetazoline on a regular, multiple times a day basis. Uses flonase occasionally. Denies fevers, chills, sob, wheezing.  She also complains of chronic low back pain that sometimes causes her leg muscle spasms.  Previous trial of tramadol not successful, and not responsive to tylenol or mobic.   Medications: Outpatient Medications Prior to Visit  Medication Sig   acetaminophen (TYLENOL) 500 MG tablet Take 500 mg by mouth every 8 (eight) hours as needed.   albuterol (VENTOLIN HFA) 108 (90 Base) MCG/ACT inhaler Inhale into the lungs every 6 (six) hours as needed for wheezing or shortness of breath.   atorvastatin (LIPITOR) 40 MG tablet Take 1 tablet (40 mg total) by mouth daily.   calcium carbonate (TUMS EX) 750 MG chewable tablet Chew 2 tablets by mouth 2 (two) times daily as needed for heartburn.   donepezil (ARICEPT) 10 MG tablet TAKE 1 TABLET BY MOUTH AT BEDTIME   fluticasone (FLONASE) 50 MCG/ACT nasal spray Place 2 sprays into both nostrils daily.   levothyroxine (EUTHYROX) 125 MCG tablet Take 1 tablet (125 mcg total) by mouth daily.   losartan (COZAAR) 25 MG tablet Take 2 tablets (50 mg total) by mouth daily.   meloxicam (MOBIC) 7.5 MG tablet Take 1 tablet by mouth once daily   OXYGEN Inhale 3 L into the lungs. Used at night and PRN during the day    pantoprazole (PROTONIX) 40 MG tablet Take 1 tablet by mouth once daily   Peppermint Oil (IBGARD PO) Take by mouth as needed.   Polyethylene Glycol 3350 (MIRALAX PO) Take by mouth as needed.   potassium chloride SA (KLOR-CON) 20 MEQ tablet Take 1 tablet by mouth once daily   TRELEGY ELLIPTA 100-62.5-25 MCG/INH AEPB INHALE 1 PUFF ONCE DAILY   [DISCONTINUED] cyclobenzaprine (FLEXERIL) 5 MG tablet Take 1 tablet (5 mg total) by mouth 3 (three) times daily as needed for muscle spasms. (Patient not taking: Reported on 02/18/2021)   [DISCONTINUED] traMADol (ULTRAM) 50 MG tablet Take 1 tablet (50 mg total) by mouth every 12 (twelve) hours as needed. (Patient not taking: Reported on 02/04/2021)   No facility-administered medications prior to visit.    Review of Systems  Constitutional:  Negative for fatigue and fever.  HENT:  Positive for congestion, postnasal drip, rhinorrhea and sinus pain.   Respiratory:  Positive for cough. Negative for shortness of breath.   Cardiovascular:  Negative for chest pain and leg swelling.  Gastrointestinal:  Negative for abdominal pain.  Neurological:  Negative for dizziness and headaches.      Objective    BP (!) 126/54 (BP Location: Right Arm, Patient Position: Sitting, Cuff Size: Large)    Pulse 65    Ht 5\' 5"  (1.651 m)    Wt 187 lb 11.2 oz (85.1 kg)    SpO2 98%    BMI 31.23 kg/m  BP Readings from Last 3 Encounters:  02/18/21 (!) 126/54  02/04/21 132/71  01/15/21 130/80   Wt Readings from Last 3 Encounters:  02/18/21 187 lb 11.2 oz (85.1 kg)  02/04/21 187 lb 3.2 oz (84.9 kg)  01/15/21 185 lb (83.9 kg)      Physical Exam Constitutional:      General: She is awake.     Appearance: She is well-developed.  HENT:     Head: Normocephalic.     Right Ear: Tympanic membrane normal.     Left Ear: Tympanic membrane normal.     Nose: Congestion present.     Mouth/Throat:     Pharynx: No posterior oropharyngeal erythema.  Eyes:     Conjunctiva/sclera:  Conjunctivae normal.  Cardiovascular:     Rate and Rhythm: Normal rate and regular rhythm.     Heart sounds: Normal heart sounds.  Pulmonary:     Effort: Pulmonary effort is normal.     Breath sounds: Normal breath sounds. No wheezing, rhonchi or rales.  Skin:    General: Skin is warm.  Neurological:     Mental Status: She is alert and oriented to person, place, and time.  Psychiatric:        Attention and Perception: Attention normal.        Mood and Affect: Mood normal.        Speech: Speech normal.        Behavior: Behavior is cooperative.     No results found for any visits on 02/18/21.  Assessment & Plan     URI Advised fluids, to decrease coffee use and start drinking more water Rx azelastine to use instead of oxymetazoline Rx tessalon pearls for cough  Problem List Items Addressed This Visit       Other   Chronic bilateral low back pain    Discussed physical therapy. Daughter thinks she could manage once a week.  Will place order.        Relevant Orders   Ambulatory referral to Physical Therapy   Other Visit Diagnoses     Upper respiratory tract infection, unspecified type    -  Primary   Relevant Medications   benzonatate (TESSALON PERLES) 100 MG capsule   azelastine (ASTELIN) 0.1 % nasal spray        Return in about 4 months (around 06/18/2021) for CPE.      I, Samantha Kirschner, PA-C have reviewed all documentation for this visit. The documentation on  02/18/2021 for the exam, diagnosis, procedures, and orders are all accurate and complete.    Samantha Kirschner, PA-C  [facility] [phone] (phone) [phone] (fax)  [place]

## 2021-02-18 NOTE — Assessment & Plan Note (Signed)
Discussed physical therapy. Daughter thinks she could manage once a week.  Will place order.

## 2021-02-19 ENCOUNTER — Encounter: Payer: Self-pay | Admitting: Cardiology

## 2021-02-19 ENCOUNTER — Ambulatory Visit: Payer: Medicare Other | Admitting: Cardiology

## 2021-02-19 ENCOUNTER — Other Ambulatory Visit: Payer: Self-pay

## 2021-02-19 VITALS — BP 152/70 | HR 60 | Wt 188.0 lb

## 2021-02-19 DIAGNOSIS — E7800 Pure hypercholesterolemia, unspecified: Secondary | ICD-10-CM

## 2021-02-19 DIAGNOSIS — R072 Precordial pain: Secondary | ICD-10-CM

## 2021-02-19 DIAGNOSIS — I350 Nonrheumatic aortic (valve) stenosis: Secondary | ICD-10-CM | POA: Diagnosis not present

## 2021-02-19 DIAGNOSIS — I10 Essential (primary) hypertension: Secondary | ICD-10-CM | POA: Diagnosis not present

## 2021-02-19 DIAGNOSIS — I2510 Atherosclerotic heart disease of native coronary artery without angina pectoris: Secondary | ICD-10-CM | POA: Diagnosis not present

## 2021-02-19 NOTE — Progress Notes (Signed)
Cardiology Office Note:    Date:  02/19/2021   ID:  Samantha Henry, DOB 08-Feb-1945, MRN [id number]  PCP:  Mikey Kirschner, PA-C   [facility] Providers Cardiologist:  Kate Sable, MD     Referring MD: Mikey Kirschner, PA-C   Chief Complaint  Patient presents with   OTher    Follow up post testing. Meds reviewed verbally with patient.     History of Present Illness:    Samantha Henry is a 76 y.o. female with a hx of CAD (coronary calcification on CT chest), hypertension, hyperlipidemia, VWD not on aspirin, former smoker x40+ years, COPD, intracranial aneurysm who presents for follow-up.    Previously seen due to coronary artery calcifications/CAD.  She had nonspecific chest pain. echocardiogram and Lexiscan Myoview was ordered to evaluate any significant cardiac dysfunction.  She still has occasional chest pain not associated with exertion.  Takes medications as prescribed.  Has shortness of breath with exertion which is chronic.   Prior notes Chest CT 07/2019 showed LAD and left circumflex calcifications, emphysema.   She has a history of von Willebrand's disorder, not on aspirin for this.  She has easy bruisability.  She has occasional chest pain not associated with exertion ongoing over the past 3 to 4 weeks.  Has an appointment with neurologist at [facility] next week due to intracranial aneurysm.  Past Medical History:  Diagnosis Date   Arthritis    osteo   Cancer ([place]) 1969   CERVICAL CA   COPD (chronic obstructive pulmonary disease) ([place])    Diabetes mellitus without complication (HCC)    Diabetic peripheral neuropathy (HCC)    Environmental and seasonal allergies    GERD (gastroesophageal reflux disease)    History of cervical cancer    History of colon polyps    Hypercholesteremia    Hypertension    Hypothyroidism    Nail dystrophy    Neuropathy    Proteinuria    Restless legs syndrome    Sleep apnea    TIA (transient ischemic attack)    Von  Willebrand's disease     Past Surgical History:  Procedure Laterality Date   ABDOMINAL HYSTERECTOMY  1969   due to cervical cancer    APPENDECTOMY     BLADDER SURGERY  1997   bladder tacking   BREAST BIOPSY Left 01/21/2015   FIBROADENOMATOUS CHANGE WITH ASSOCIATED MICROCALCIFICATIONS   CARDIAC CATHETERIZATION     CARPAL TUNNEL RELEASE  2010   colonoscopy and upper endoscopy'     COLONOSCOPY WITH PROPOFOL N/A 02/25/2016   Procedure: COLONOSCOPY WITH PROPOFOL;  Surgeon: Lollie Sails, MD;  Location[facility]COPY;  Service: Endoscopy;  Laterality: N/A;   COLONOSCOPY WITH PROPOFOL N/A 03/22/2019   Procedure: COLONOSCOPY WITH PROPOFOL;  Surgeon: Robert Bellow, MD;  Location: [facility] ENDOSCOPY;  Service: Endoscopy;  Laterality: N/A;   ESOPHAGOGASTRODUODENOSCOPY (EGD) WITH PROPOFOL N/A 03/22/2019   Procedure: ESOPHAGOGASTRODUODENOSCOPY (EGD) WITH PROPOFOL;  Surgeon: Robert Bellow, MD;  Location: [facility] ENDOSCOPY;  Service: Endoscopy;  Laterality: N/A;   JOINT REPLACEMENT Left 06/14/2016   KNEE   MUSCLE BIOPSY     OOPHORECTOMY     TENDON RELEASE  2010   right foot, Dr. Vickki Muff    TOTAL KNEE ARTHROPLASTY Left 06/14/2016   Procedure: TOTAL KNEE ARTHROPLASTY;  Surgeon: Corky Mull, MD;  Location: [facility] ORS;  Service: Orthopedics;  Laterality: Left;    Current Medications: Current Meds  Medication Sig   acetaminophen (TYLENOL) 500 MG tablet Take  500 mg by mouth every 8 (eight) hours as needed.   albuterol (VENTOLIN HFA) 108 (90 Base) MCG/ACT inhaler Inhale into the lungs every 6 (six) hours as needed for wheezing or shortness of breath.   atorvastatin (LIPITOR) 40 MG tablet Take 1 tablet (40 mg total) by mouth daily.   azelastine (ASTELIN) 0.1 % nasal spray Place 2 sprays into both nostrils 2 (two) times daily. Use in each nostril as directed   benzonatate (TESSALON PERLES) 100 MG capsule Take 1 capsule (100 mg total) by mouth 3 (three) times daily as needed for cough.   calcium  carbonate (TUMS EX) 750 MG chewable tablet Chew 2 tablets by mouth 2 (two) times daily as needed for heartburn.   donepezil (ARICEPT) 10 MG tablet TAKE 1 TABLET BY MOUTH AT BEDTIME   fluticasone (FLONASE) 50 MCG/ACT nasal spray Place 2 sprays into both nostrils daily.   levothyroxine (EUTHYROX) 125 MCG tablet Take 1 tablet (125 mcg total) by mouth daily.   losartan (COZAAR) 25 MG tablet Take 2 tablets (50 mg total) by mouth daily.   meloxicam (MOBIC) 7.5 MG tablet Take 1 tablet by mouth once daily   OXYGEN Inhale 3 L into the lungs. Used at night and PRN during the day   pantoprazole (PROTONIX) 40 MG tablet Take 1 tablet by mouth once daily   Peppermint Oil (IBGARD PO) Take by mouth as needed.   Polyethylene Glycol 3350 (MIRALAX PO) Take by mouth as needed.   potassium chloride SA (KLOR-CON) 20 MEQ tablet Take 1 tablet by mouth once daily   TRELEGY ELLIPTA 100-62.5-25 MCG/INH AEPB INHALE 1 PUFF ONCE DAILY     Allergies:   Aspirin, Gabapentin, Lyrica [pregabalin], and Tetracycline   Social History   Socioeconomic History   Marital status: Divorced    Spouse name: Not on file   Number of children: 3   Years of education: Not on file   Highest education level: 8th grade  Occupational History   Occupation: retired  Tobacco Use   Smoking status: Former    Packs/day: 1.50    Years: 48.00    Pack years: 72.00    Types: Cigarettes    Quit date: 01/11/2007    Years since quitting: 14.1   Smokeless tobacco: Never   Tobacco comments:    smoked up to 2 ppd since 76 yo. quit in 2009  Vaping Use   Vaping Use: Never used  Substance and Sexual Activity   Alcohol use: No    Alcohol/week: 0.0 standard drinks   Drug use: No   Sexual activity: Not on file  Other Topics Concern   Not on file  Social History Narrative   Not on file   Social Determinants of Health   Financial Resource Strain: Low Risk    Difficulty of Paying Living Expenses: Not very hard  Food Insecurity: No Food  Insecurity   Worried About [place] in the Last Year: Never true   [place] in the Last Year: Never true  Transportation Needs: No Transportation Needs   Lack of Transportation (Medical): No   Lack of Transportation (Non-Medical): No  Physical Activity: Inactive   Days of Exercise per Week: 0 days   Minutes of Exercise per Session: 0 min  Stress: No Stress Concern Present   Feeling of Stress : Not at all  Social Connections: Socially Isolated   Frequency of Communication with Friends and Family: More than three times a week  Frequency of Social Gatherings with Friends and Family: More than three times a week   Attends Religious Services: Never   Marine scientist or Organizations: No   Attends Music therapist: Never   Marital Status: Divorced     Family History: The patient's family history includes Alzheimer's disease in her mother; Breast cancer in her maternal aunt; Cancer in her father; Cancer (age of onset: 60) in her sister; Colon cancer in her sister; Diabetes in her brother and sister; Hodgkin's lymphoma in her sister.  ROS:   Please see the history of present illness.     All other systems reviewed and are negative.  EKGs/Labs/Other Studies Reviewed:    The following studies were reviewed today:   EKG:  EKG is  ordered today.  The ekg ordered today demonstrates sinus bradycardia, first-degree AV block.  Heart rate 51.  Recent Labs: 08/20/2020: Hemoglobin 12.9; Platelets 133; TSH 0.508 11/10/2020: ALT 15; BUN 17; Potassium 4.4; Sodium 141 12/30/2020: Creatinine, Ser 0.60  Recent Lipid Panel    Component Value Date/Time   CHOL 305 (H) 08/20/2020 0954   TRIG 54 08/20/2020 0954   HDL 73 08/20/2020 0954   CHOLHDL 4.2 08/20/2020 0954   CHOLHDL 2.0 12/23/2016 1141   LDLCALC 225 (H) 08/20/2020 0954   LDLCALC 65 12/23/2016 1141     Risk Assessment/Calculations:          Physical Exam:    VS:  BP (!) 152/70 (BP Location:  Left Arm, Patient Position: Sitting, Cuff Size: Normal)    Pulse 60    Wt 188 lb (85.3 kg)    SpO2 98%    BMI 31.28 kg/m     Wt Readings from Last 3 Encounters:  02/19/21 188 lb (85.3 kg)  02/18/21 187 lb 11.2 oz (85.1 kg)  02/04/21 187 lb 3.2 oz (84.9 kg)     GEN:  Well nourished, well developed in no acute distress HEENT: Normal NECK: No JVD; No carotid bruits LYMPHATICS: No lymphadenopathy CARDIAC: RRR, 2/6 systolic murmur RESPIRATORY: No wheezing or rhonchi, diminished breath sounds ABDOMEN: Soft, non-tender, non-distended MUSCULOSKELETAL:  No edema; left-sided chest tenderness with palpation SKIN: Warm and dry NEUROLOGIC:  Alert and oriented x 3 PSYCHIATRIC:  Normal affect   ASSESSMENT:    1. Coronary artery disease involving native coronary artery of native heart, unspecified whether angina present   2. Aortic valve stenosis, etiology of cardiac valve disease unspecified   3. Primary hypertension   4. Pure hypercholesterolemia   5. Precordial pain     PLAN:    In order of problems listed above:  CAD calcifications noted in the LAD and left circumflex on chest CT.  Easy bruisability due to von Willebrand's disease, aspirin is being avoided.  Also has intracranial aneurysm.  Echo with EF 55 to 60%, mild to moderate aortic valve stenosis.  Lexiscan Myoview with no significant ischemia, low risk study.  Continue Lipitor 40 mg daily. Mild to moderate aortic valve stenosis, monitor with serial echocardiograms.  Repeat echo in 1 year around 01/2022. Hypertension, BP elevated today, usually controlled.  Continue losartan 25 mg daily. Hyperlipidemia, continue Lipitor 40 mg daily.  Repeat lipid panel planned with primary care provider. Chest pain, reproducible with palpation, musculoskeletal in origin.  Follow-up in 1 year after repeat echo.        Medication Adjustments/Labs and Tests Ordered: Current medicines are reviewed at length with the patient today.  Concerns  regarding medicines are outlined above.  Orders  Placed This Encounter  Procedures   ECHOCARDIOGRAM COMPLETE    No orders of the defined types were placed in this encounter.    Patient Instructions  Medication Instructions:  Your physician recommends that you continue on your current medications as directed. Please refer to the Current Medication list given to you today.  *If you need a refill on your cardiac medications before your next appointment, please call your pharmacy*   Lab Work: None ordered If you have labs (blood work) drawn today and your tests are completely normal, you will receive your results only by: [place] (if you have MyChart) OR A paper copy in the mail If you have any lab test that is abnormal or we need to change your treatment, we will call you to review the results.   Testing/Procedures:  Your physician has requested that you have an echocardiogram in 1 year. Echocardiography is a painless test that uses sound waves to create images of your heart. It provides your doctor with information about the size and shape of your heart and how well your hearts chambers and valves are working. This procedure takes approximately one hour. There are no restrictions for this procedure.    Follow-Up: At [facility], you and your health needs are our priority.  As part of our continuing mission to provide you with exceptional heart care, we have created designated Provider Care Teams.  These Care Teams include your primary Cardiologist (physician) and Advanced Practice Providers (APPs -  Physician Assistants and Nurse Practitioners) who all work together to provide you with the care you need, when you need it.  We recommend signing up for the patient portal called "MyChart".  Sign up information is provided on this After Visit Summary.  MyChart is used to connect with patients for Virtual Visits (Telemedicine).  Patients are able to view lab/test results,  encounter notes, upcoming appointments, etc.  Non-urgent messages can be sent to your provider as well.   To learn more about what you can do with MyChart, go to [email or web address].    Your next appointment:   1 year(s)  The format for your next appointment:   In Person  Provider:   You may see Kate Sable, MD or one of the following Advanced Practice Providers on your designated Care Team:   Murray Hodgkins, NP Christell Faith, PA-C Cadence Kathlen Mody, [place]    Other Instructions     Signed, Kate Sable, MD  02/19/2021 5:02 PM    [place]

## 2021-02-19 NOTE — Patient Instructions (Signed)
Medication Instructions:  Your physician recommends that you continue on your current medications as directed. Please refer to the Current Medication list given to you today.  *If you need a refill on your cardiac medications before your next appointment, please call your pharmacy*   Lab Work: None ordered If you have labs (blood work) drawn today and your tests are completely normal, you will receive your results only by: [place] (if you have MyChart) OR A paper copy in the mail If you have any lab test that is abnormal or we need to change your treatment, we will call you to review the results.   Testing/Procedures:  Your physician has requested that you have an echocardiogram in 1 year. Echocardiography is a painless test that uses sound waves to create images of your heart. It provides your doctor with information about the size and shape of your heart and how well your hearts chambers and valves are working. This procedure takes approximately one hour. There are no restrictions for this procedure.    Follow-Up: At [facility], you and your health needs are our priority.  As part of our continuing mission to provide you with exceptional heart care, we have created designated Provider Care Teams.  These Care Teams include your primary Cardiologist (physician) and Advanced Practice Providers (APPs -  Physician Assistants and Nurse Practitioners) who all work together to provide you with the care you need, when you need it.  We recommend signing up for the patient portal called "MyChart".  Sign up information is provided on this After Visit Summary.  MyChart is used to connect with patients for Virtual Visits (Telemedicine).  Patients are able to view lab/test results, encounter notes, upcoming appointments, etc.  Non-urgent messages can be sent to your provider as well.   To learn more about what you can do with MyChart, go to [email or web address].    Your next appointment:    1 year(s)  The format for your next appointment:   In Person  Provider:   You may see Kate Sable, MD or one of the following Advanced Practice Providers on your designated Care Team:   Murray Hodgkins, NP Christell Faith, PA-C Cadence Kathlen Mody, [place]    Other Instructions

## 2021-02-22 ENCOUNTER — Encounter: Payer: Self-pay | Admitting: Obstetrics & Gynecology

## 2021-02-22 ENCOUNTER — Ambulatory Visit (INDEPENDENT_AMBULATORY_CARE_PROVIDER_SITE_OTHER): Payer: Medicare Other | Admitting: Obstetrics & Gynecology

## 2021-02-22 ENCOUNTER — Other Ambulatory Visit: Payer: Self-pay

## 2021-02-22 VITALS — BP 120/80 | Ht 62.0 in | Wt 186.0 lb

## 2021-02-22 DIAGNOSIS — N8110 Cystocele, unspecified: Secondary | ICD-10-CM

## 2021-02-22 DIAGNOSIS — N816 Rectocele: Secondary | ICD-10-CM | POA: Diagnosis not present

## 2021-02-22 NOTE — Progress Notes (Signed)
HPI:      [name] is a 76 y.o. G3P0 who presents today for her pessary follow up and examination related to her pelvic floor weakening.  Pt is here for pessary ring #1 placement after fitting 2 weeks ago.  Continues w daily feel of rectocele, having to push back in.  PMHx: She  has a past medical history of Arthritis, Cancer ([place]) (1969), COPD (chronic obstructive pulmonary disease) ([place]), Diabetes mellitus without complication ([place]), Diabetic peripheral neuropathy ([place]), Environmental and seasonal allergies, GERD (gastroesophageal reflux disease), History of cervical cancer, History of colon polyps, Hypercholesteremia, Hypertension, Hypothyroidism, Nail dystrophy, Neuropathy, Proteinuria, Restless legs syndrome, Sleep apnea, TIA (transient ischemic attack), and Von Willebrand's disease. Also,  has a past surgical history that includes Tendon release (2010); Carpal tunnel release (2010); Bladder surgery (1997); Appendectomy; colonoscopy and upper endoscopy'; Cardiac catheterization; Colonoscopy with propofol (N/A, 02/25/2016); Total knee arthroplasty (Left, 06/14/2016); Oophorectomy; Abdominal hysterectomy (1969); Breast biopsy (Left, 01/21/2015); Joint replacement (Left, 06/14/2016); Muscle biopsy; Colonoscopy with propofol (N/A, 03/22/2019); and Esophagogastroduodenoscopy (egd) with propofol (N/A, 03/22/2019)., family history includes Alzheimer's disease in her mother; Breast cancer in her maternal aunt; Cancer in her father; Cancer (age of onset: 44) in her sister; Colon cancer in her sister; Diabetes in her brother and sister; Hodgkin's lymphoma in her sister.,  reports that she quit smoking about 14 years ago. Her smoking use included cigarettes. She has a 72.00 pack-year smoking history. She has never used smokeless tobacco. She reports that she does not drink alcohol and does not use drugs.  She has a current medication list which includes the following prescription(s): acetaminophen,  albuterol, atorvastatin, azelastine, benzonatate, calcium carbonate, donepezil, fluticasone, levothyroxine, losartan, meloxicam, oxygen-helium, pantoprazole, peppermint oil, polyethylene glycol 3350, potassium chloride sa, and trelegy ellipta. Also, is allergic to aspirin, gabapentin, lyrica [pregabalin], and tetracycline.  Review of Systems  All other systems reviewed and are negative.  Objective: BP 120/80    Ht 5\' 2"  (1.575 m)    Wt 186 lb (84.4 kg)    BMI 34.02 kg/m  Physical Exam Constitutional:      General: She is not in acute distress.    Appearance: She is well-developed.  Genitourinary:     Bladder and urethral meatus normal.     Vaginal cuff intact.    No vaginal erythema or bleeding.     Posterior and apical vaginal prolapse present.    Mild vaginal atrophy present.     Right Adnexa: not tender and no mass present.    Left Adnexa: not tender and no mass present.    Cervix is absent.     Uterus is absent.     Pelvic exam was performed with patient in the lithotomy position.  Rectum:     Rectal exam comments: Rectocele gr2.  HENT:     Head: Normocephalic and atraumatic.     Nose: Nose normal.  Abdominal:     General: There is no distension.     Palpations: Abdomen is soft.     Tenderness: There is no abdominal tenderness.  Musculoskeletal:        General: Normal range of motion.  Neurological:     Mental Status: She is alert and oriented to person, place, and time.     Cranial Nerves: No cranial nerve deficit.  Skin:    General: Skin is warm and dry.  Psychiatric:        Attention and Perception: Attention normal.  Mood and Affect: Mood normal.        Speech: Speech normal.        Behavior: Behavior normal.        Cognition and Memory: Cognition normal.        Judgment: Judgment normal.    Pessary Care Pessary removed and cleaned.  Vagina checked - without erosions - pessary replaced.  A/P:   ICD-10-CM   1. Vaginal prolapse  N81.10     2.  Rectocele  N81.6      Pessary RING #1 was placed today. Instructions given for care. Concerning symptoms to observe for are counseled to patient. Follow up scheduled for 3 months.  A total of 20 minutes were spent face-to-face with the patient as well as preparation, review, communication, and documentation during this encounter.   Barnett Applebaum, MD, [facility], [facility] 02/22/2021  2:24 PM

## 2021-03-07 ENCOUNTER — Other Ambulatory Visit: Payer: Self-pay | Admitting: Physician Assistant

## 2021-03-07 DIAGNOSIS — M545 Low back pain, unspecified: Secondary | ICD-10-CM

## 2021-03-07 IMAGING — CT CT CHEST LUNG CANCER SCREENING LOW DOSE
2 of 5 series · 15 of 40 positions shown, 18 images · non-contrast
Comparison: Low-dose lung cancer screening chest CT 06/01/2017.

CLINICAL DATA: 72-year-old female former smoker (quit 11 years ago)
with 72 pack-year history of smoking. Lung cancer screening
examination.

EXAM:
CT CHEST WITHOUT CONTRAST LOW-DOSE FOR LUNG CANCER SCREENING
TECHNIQUE: Multidetector CT imaging of the chest was performed following the
standard protocol without IV contrast.

[Series 3: lung · axial · 0.67mm/px · z∈[-1173,-882]mm · 12 of 321 slices shown, 15 images]
[im 15/321  mediastinal]
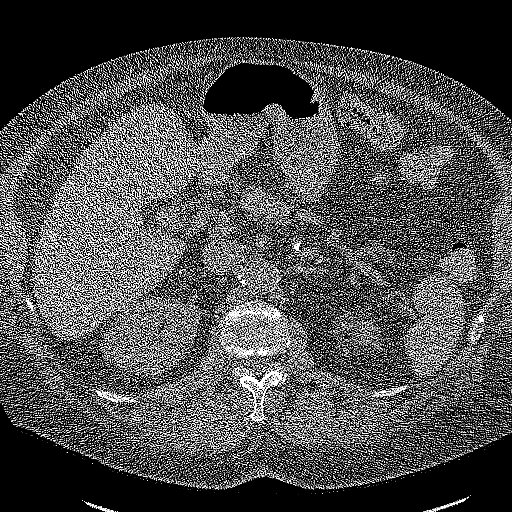
[im 15/321  lung]
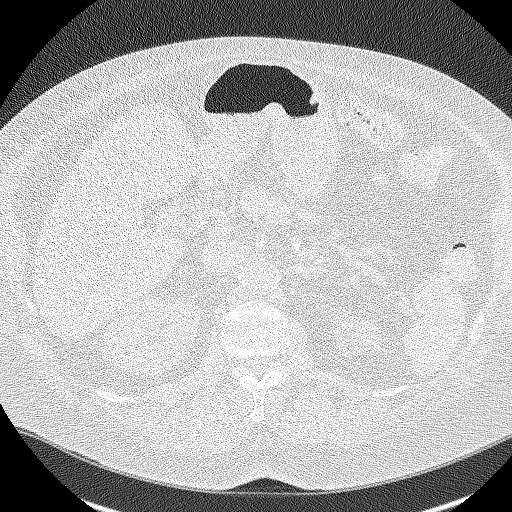
[im 44/321  lung]
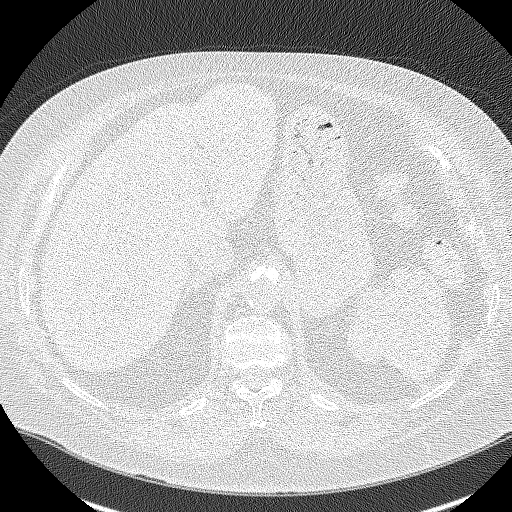
[im 73/321  lung]
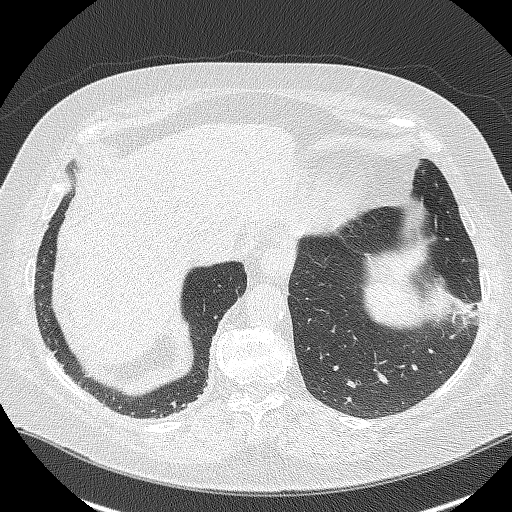
[im 102/321  lung]
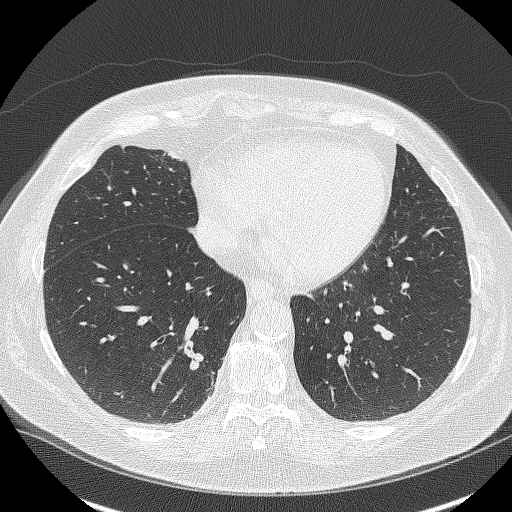
[im 117/321  mediastinal]
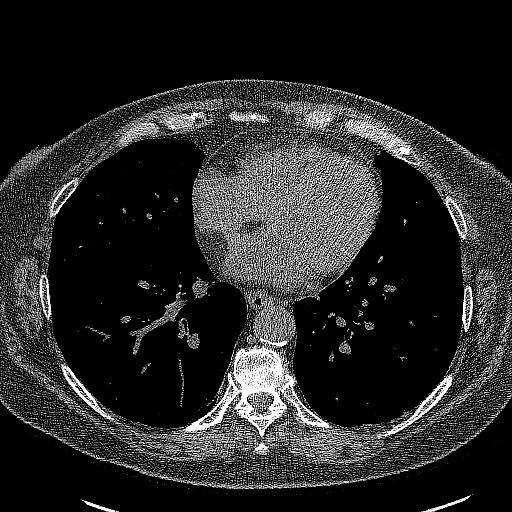
[im 117/321  lung]
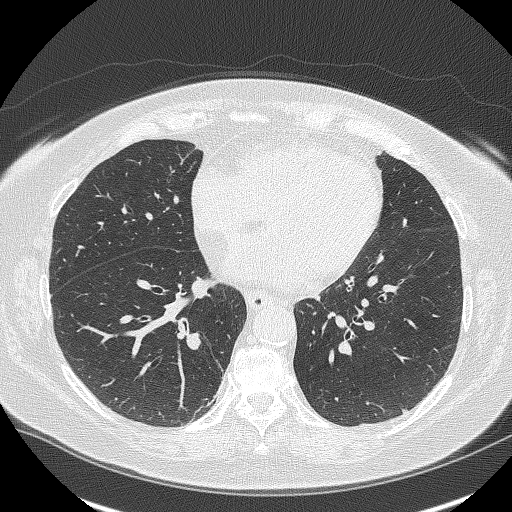
[im 146/321  lung]
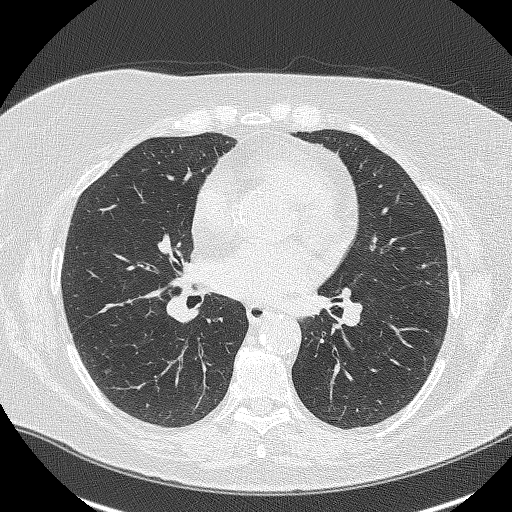
[im 175/321  lung]
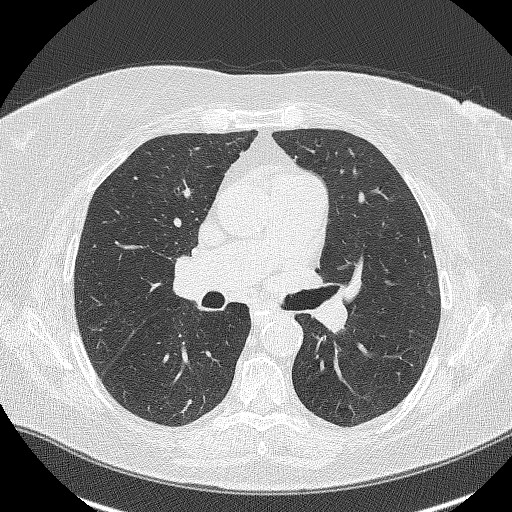
[im 204/321  lung]
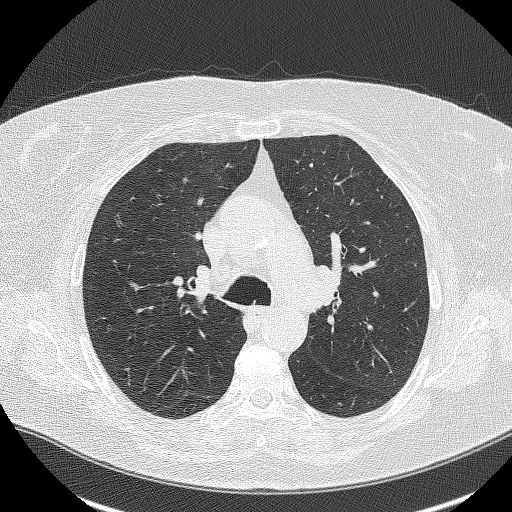
[im 219/321  mediastinal]
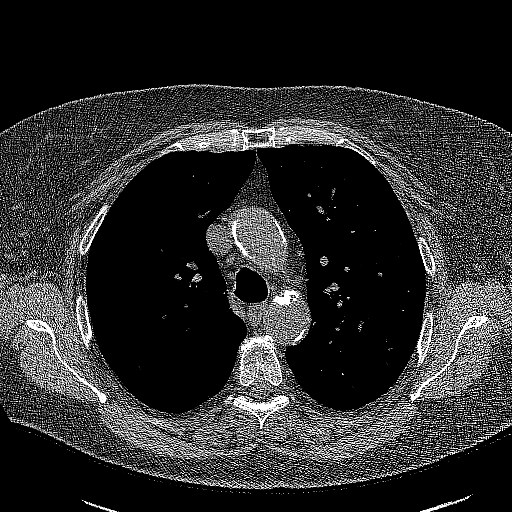
[im 219/321  lung]
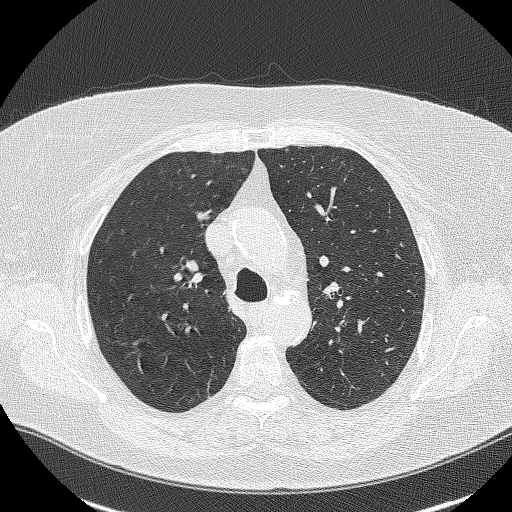
[im 248/321  lung]
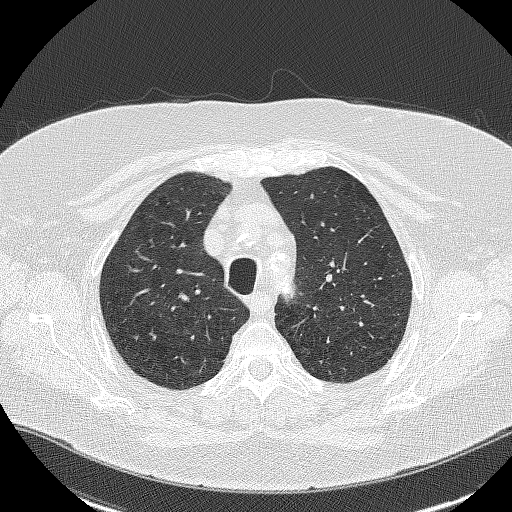
[im 277/321  lung]
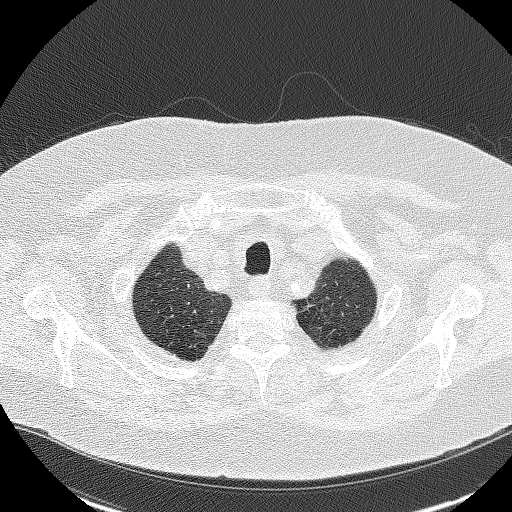
[im 306/321  lung]
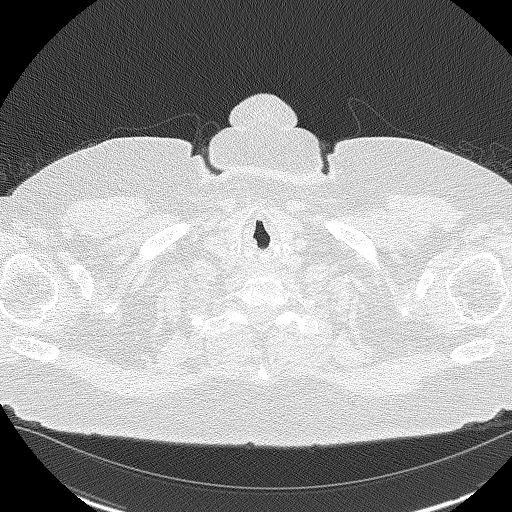

[Series 4: coronal lung · coronal · 0.63mm/px · 3 of 306 slices shown]
[im 62/306  lung]
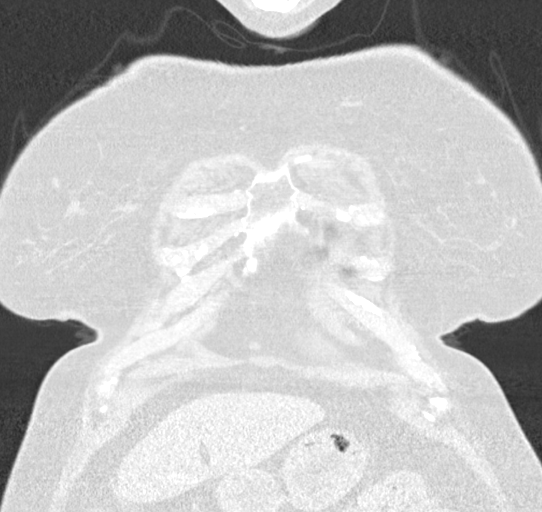
[im 123/306  lung]
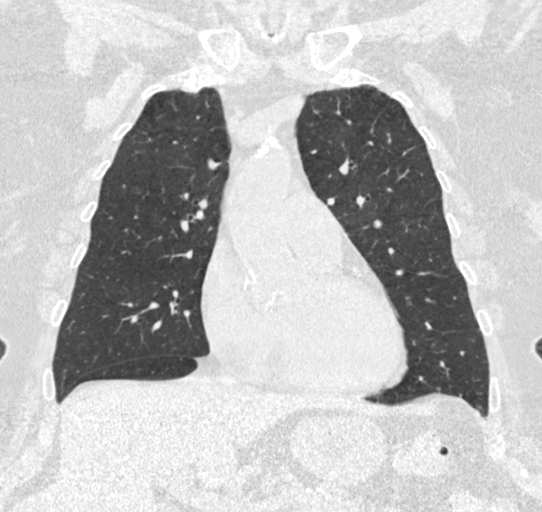
[im 184/306  lung]
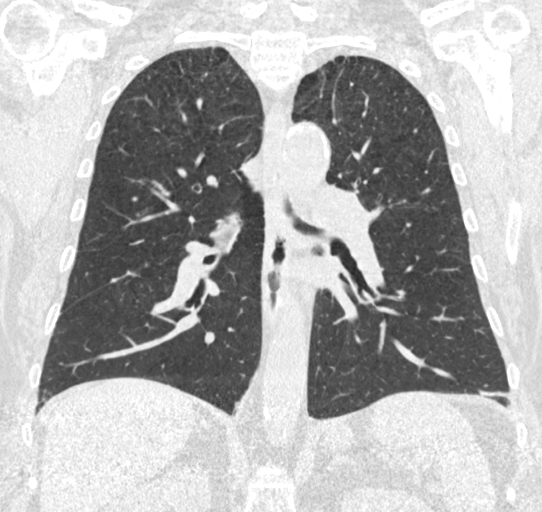

[15 of 40 positions shown; findings below may reference images not displayed]

FINDINGS: Cardiovascular: Heart size is normal. There is no significant
pericardial fluid, thickening or pericardial calcification. There is
aortic atherosclerosis, as well as atherosclerosis of the great
vessels of the mediastinum and the coronary arteries, including
calcified atherosclerotic plaque in the left main, left anterior
descending, left circumflex and right coronary arteries. Severe
calcifications of the aortic valve. Mild calcifications of the
mitral annulus.

Mediastinum/Nodes: No pathologically enlarged mediastinal or hilar
lymph nodes. Please note that accurate exclusion of hilar adenopathy
is limited on noncontrast CT scans. Esophagus is unremarkable in
appearance. No axillary lymphadenopathy.

Lungs/Pleura: There is been interval enlargement and morphologic
change in a left upper lobe lesion which was previously a subsolid
nodule. This currently has a volume derived mean diameter of 19.7 mm
with a peripheral solid component with a mean diameter of 7.5 mm. No
acute consolidative airspace disease. No pleural effusions. Mild
diffuse bronchial wall thickening with mild centrilobular and
paraseptal emphysema.

Upper Abdomen: Aortic atherosclerosis.

Musculoskeletal: There are no aggressive appearing lytic or blastic
lesions noted in the visualized portions of the skeleton.
IMPRESSION: 1. Lung-RADS 4B, suspicious. Additional imaging evaluation or
consultation with Pulmonology or Thoracic Surgery recommended.
2. The "S" modifier above refers to potentially clinically
significant non lung cancer related findings. Specifically, there is
aortic atherosclerosis, in addition to left main and 3 vessel
coronary artery disease. Assessment for potential risk factor
modification, dietary therapy or pharmacologic therapy may be
warranted, if clinically indicated.
3. Mild diffuse bronchial wall thickening with mild centrilobular
and paraseptal emphysema.
4. There are severe calcifications of the aortic valve and mild
calcifications of the mitral annulus. Echocardiographic correlation
for evaluation of potential valvular dysfunction may be warranted if
clinically indicated.

These results will be called to the ordering clinician or
representative by the Radiologist Assistant, and communication
documented in the PACS or zVision Dashboard.

Aortic Atherosclerosis (OR3NE-13O.O) and Emphysema (OR3NE-8D5.S).

## 2021-03-18 ENCOUNTER — Ambulatory Visit: Payer: Self-pay

## 2021-03-18 DIAGNOSIS — E1149 Type 2 diabetes mellitus with other diabetic neurological complication: Secondary | ICD-10-CM

## 2021-03-18 NOTE — Chronic Care Management (AMB) (Unsigned)
Chronic Care Management   CCM RN Visit Note  03/18/2021 Name: Samantha Henry MRN: [id number] DOB: 1945/09/26  Subjective: Samantha Henry is a 76 y.o. year old female who is a primary care patient of Mikey Kirschner, [place]. The care management team was consulted for assistance with disease management and care coordination needs.    {CCMTELEPHONEFACETOFACE:21091510} for {CCMINITIALFOLLOWUPCHOICE:21091511} in response to provider referral for case management and/or care coordination services.   Consent to Services:  {CCMCONSENTOPTIONS:25074}  Patient agreed to services and verbal consent obtained.   Assessment: Review of patient past medical history, allergies, medications, health status, including review of consultants reports, laboratory and other test data, was performed as part of comprehensive evaluation and provision of chronic care management services.   SDOH (Social Determinants of Health) assessments and interventions performed:    CCM Care Plan  Allergies  Allergen Reactions   Aspirin     AVOIDs due to history of Von Willebrands   Gabapentin     dizzy   Lyrica [Pregabalin] Other (See Comments)    Dizziness   Tetracycline Rash    Outpatient Encounter Medications as of 03/18/2021  Medication Sig   acetaminophen (TYLENOL) 500 MG tablet Take 500 mg by mouth every 8 (eight) hours as needed.   albuterol (VENTOLIN HFA) 108 (90 Base) MCG/ACT inhaler Inhale into the lungs every 6 (six) hours as needed for wheezing or shortness of breath.   atorvastatin (LIPITOR) 40 MG tablet Take 1 tablet (40 mg total) by mouth daily.   azelastine (ASTELIN) 0.1 % nasal spray Place 2 sprays into both nostrils 2 (two) times daily. Use in each nostril as directed   benzonatate (TESSALON PERLES) 100 MG capsule Take 1 capsule (100 mg total) by mouth 3 (three) times daily as needed for cough.   calcium carbonate (TUMS EX) 750 MG chewable tablet Chew 2 tablets by mouth 2 (two) times daily as needed for  heartburn.   donepezil (ARICEPT) 10 MG tablet TAKE 1 TABLET BY MOUTH AT BEDTIME   fluticasone (FLONASE) 50 MCG/ACT nasal spray Place 2 sprays into both nostrils daily.   levothyroxine (EUTHYROX) 125 MCG tablet Take 1 tablet (125 mcg total) by mouth daily.   losartan (COZAAR) 25 MG tablet Take 2 tablets (50 mg total) by mouth daily.   meloxicam (MOBIC) 7.5 MG tablet Take 1 tablet by mouth once daily   OXYGEN Inhale 3 L into the lungs. Used at night and PRN during the day   pantoprazole (PROTONIX) 40 MG tablet Take 1 tablet by mouth once daily   Peppermint Oil (IBGARD PO) Take by mouth as needed.   Polyethylene Glycol 3350 (MIRALAX PO) Take by mouth as needed.   potassium chloride SA (KLOR-CON) 20 MEQ tablet Take 1 tablet by mouth once daily   TRELEGY ELLIPTA 100-62.5-25 MCG/INH AEPB INHALE 1 PUFF ONCE DAILY   No facility-administered encounter medications on file as of 03/18/2021.    Patient Active Problem List   Diagnosis Date Noted   Rectocele 01/15/2021   Vaginal prolapse 01/05/2021   Chronic bilateral low back pain 12/07/2020   Bradycardia with 41-50 beats per minute 12/07/2020   Status post fall 12/07/2020   Contusion of knee 06/22/2020   Contusion of left knee 06/22/2020   Left knee pain 06/22/2020   Osteoarthritis of knee 06/22/2020   Other specified soft tissue disorders 06/22/2020   Plantar wart 05/08/2019   Venous stasis 04/29/2019   Chronic venous insufficiency 04/29/2019   Non-small cell lung cancer (NSCLC) ([place]) 03/01/2019  Dementia ([place]) 01/01/2019   Cerebrovascular disease 12/25/2018   Carotid atherosclerosis, bilateral 12/25/2018   Right internal carotid artery aneurysm 83/41/9622   Systolic murmur 29/79/8921   Senile purpura ([place]) 11/23/2018   Iron deficiency anemia 11/23/2018   Intention tremor 11/23/2018   Lung nodule seen on imaging study 11/23/2018   Aortic atherosclerosis ([place]) 07/12/2018   Coronary artery disease 07/12/2018   Aortic valve calcification  07/12/2018   DDD (degenerative disc disease), cervical 02/01/2018   Chronic bilateral back pain 02/01/2018   Morbid obesity ([place]) 06/26/2017   History of smoking 30 or more pack years 05/12/2017   Status post total knee replacement using cement, left 06/14/2016   Polyneuropathy 02/26/2015   Allergic rhinitis 07/29/2014   COPD (chronic obstructive pulmonary disease) ([place]) 07/25/2014   Type 2 diabetes mellitus with neurological complications ([place]) 19/41/7408   Diverticulosis of colon 07/25/2014   Edema 07/25/2014   GERD (gastroesophageal reflux disease) 07/25/2014   Hypercholesteremia 07/25/2014   Hypertension 07/25/2014   Hypothyroid 07/25/2014   Cramps of lower extremity 07/25/2014   Microalbuminuria 07/25/2014   Nail dystrophy 07/25/2014   Osteoarthritis of leg 07/25/2014   Restless legs syndrome 07/25/2014   TIA (transient ischemic attack) 07/25/2014   Von Willebrand's disease 07/25/2014   OSA (obstructive sleep apnea) 10/31/2013   History of adenomatous polyp of colon 08/26/2005    Conditions to be addressed/monitored:{CCM ASSESSMENT DZ OPTIONS:25047}  There are no care plans that you recently modified to display for this patient.   Plan:{CM FOLLOW UP PLAN:25073} SIG***

## 2021-03-30 ENCOUNTER — Ambulatory Visit: Payer: Medicare Other | Admitting: Obstetrics & Gynecology

## 2021-03-30 ENCOUNTER — Other Ambulatory Visit: Payer: Self-pay | Admitting: Physician Assistant

## 2021-03-30 DIAGNOSIS — M545 Low back pain, unspecified: Secondary | ICD-10-CM

## 2021-04-27 ENCOUNTER — Ambulatory Visit: Payer: Medicare Other | Admitting: Obstetrics & Gynecology

## 2021-06-16 ENCOUNTER — Other Ambulatory Visit: Payer: Self-pay | Admitting: Physician Assistant

## 2021-06-16 DIAGNOSIS — I10 Essential (primary) hypertension: Secondary | ICD-10-CM

## 2021-06-29 ENCOUNTER — Inpatient Hospital Stay
Admission: EM | Admit: 2021-06-29 | Discharge: 2021-07-10 | DRG: 951 | Disposition: E | Payer: Medicare Other | Attending: Internal Medicine | Admitting: Internal Medicine

## 2021-06-29 ENCOUNTER — Emergency Department
Admission: EM | Admit: 2021-06-29 | Discharge: 2021-07-10 | Disposition: E | Payer: Medicare Other | Source: Home / Self Care | Attending: Emergency Medicine | Admitting: Emergency Medicine

## 2021-06-29 ENCOUNTER — Emergency Department: Payer: Medicare Other

## 2021-06-29 DIAGNOSIS — Z881 Allergy status to other antibiotic agents status: Secondary | ICD-10-CM

## 2021-06-29 DIAGNOSIS — I2510 Atherosclerotic heart disease of native coronary artery without angina pectoris: Secondary | ICD-10-CM | POA: Diagnosis present

## 2021-06-29 DIAGNOSIS — E039 Hypothyroidism, unspecified: Secondary | ICD-10-CM | POA: Diagnosis present

## 2021-06-29 DIAGNOSIS — Z888 Allergy status to other drugs, medicaments and biological substances status: Secondary | ICD-10-CM

## 2021-06-29 DIAGNOSIS — Z800 Family history of malignant neoplasm of digestive organs: Secondary | ICD-10-CM

## 2021-06-29 DIAGNOSIS — Z7951 Long term (current) use of inhaled steroids: Secondary | ICD-10-CM

## 2021-06-29 DIAGNOSIS — Z791 Long term (current) use of non-steroidal anti-inflammatories (NSAID): Secondary | ICD-10-CM

## 2021-06-29 DIAGNOSIS — J449 Chronic obstructive pulmonary disease, unspecified: Secondary | ICD-10-CM | POA: Diagnosis present

## 2021-06-29 DIAGNOSIS — Z803 Family history of malignant neoplasm of breast: Secondary | ICD-10-CM

## 2021-06-29 DIAGNOSIS — I611 Nontraumatic intracerebral hemorrhage in hemisphere, cortical: Secondary | ICD-10-CM

## 2021-06-29 DIAGNOSIS — E1149 Type 2 diabetes mellitus with other diabetic neurological complication: Secondary | ICD-10-CM | POA: Diagnosis present

## 2021-06-29 DIAGNOSIS — G936 Cerebral edema: Secondary | ICD-10-CM | POA: Diagnosis present

## 2021-06-29 DIAGNOSIS — Z807 Family history of other malignant neoplasms of lymphoid, hematopoietic and related tissues: Secondary | ICD-10-CM

## 2021-06-29 DIAGNOSIS — Z515 Encounter for palliative care: Principal | ICD-10-CM

## 2021-06-29 DIAGNOSIS — G2581 Restless legs syndrome: Secondary | ICD-10-CM | POA: Diagnosis present

## 2021-06-29 DIAGNOSIS — Z79899 Other long term (current) drug therapy: Secondary | ICD-10-CM

## 2021-06-29 DIAGNOSIS — Z66 Do not resuscitate: Secondary | ICD-10-CM | POA: Diagnosis present

## 2021-06-29 DIAGNOSIS — F0390 Unspecified dementia without behavioral disturbance: Secondary | ICD-10-CM | POA: Diagnosis present

## 2021-06-29 DIAGNOSIS — Z833 Family history of diabetes mellitus: Secondary | ICD-10-CM

## 2021-06-29 DIAGNOSIS — Z9981 Dependence on supplemental oxygen: Secondary | ICD-10-CM | POA: Diagnosis not present

## 2021-06-29 DIAGNOSIS — Z820 Family history of epilepsy and other diseases of the nervous system: Secondary | ICD-10-CM

## 2021-06-29 DIAGNOSIS — G459 Transient cerebral ischemic attack, unspecified: Secondary | ICD-10-CM | POA: Diagnosis present

## 2021-06-29 DIAGNOSIS — K219 Gastro-esophageal reflux disease without esophagitis: Secondary | ICD-10-CM | POA: Diagnosis present

## 2021-06-29 DIAGNOSIS — Z87891 Personal history of nicotine dependence: Secondary | ICD-10-CM | POA: Diagnosis not present

## 2021-06-29 DIAGNOSIS — D6800 Von Willebrand disease, unspecified: Secondary | ICD-10-CM | POA: Diagnosis present

## 2021-06-29 DIAGNOSIS — G935 Compression of brain: Secondary | ICD-10-CM | POA: Diagnosis present

## 2021-06-29 DIAGNOSIS — Z8541 Personal history of malignant neoplasm of cervix uteri: Secondary | ICD-10-CM

## 2021-06-29 DIAGNOSIS — E7800 Pure hypercholesterolemia, unspecified: Secondary | ICD-10-CM | POA: Diagnosis present

## 2021-06-29 DIAGNOSIS — E1142 Type 2 diabetes mellitus with diabetic polyneuropathy: Secondary | ICD-10-CM | POA: Diagnosis present

## 2021-06-29 DIAGNOSIS — I629 Nontraumatic intracranial hemorrhage, unspecified: Secondary | ICD-10-CM | POA: Insufficient documentation

## 2021-06-29 DIAGNOSIS — Z96652 Presence of left artificial knee joint: Secondary | ICD-10-CM

## 2021-06-29 DIAGNOSIS — R000 Tachycardia, unspecified: Secondary | ICD-10-CM | POA: Insufficient documentation

## 2021-06-29 DIAGNOSIS — I10 Essential (primary) hypertension: Secondary | ICD-10-CM | POA: Insufficient documentation

## 2021-06-29 DIAGNOSIS — C3490 Malignant neoplasm of unspecified part of unspecified bronchus or lung: Secondary | ICD-10-CM | POA: Diagnosis present

## 2021-06-29 DIAGNOSIS — I609 Nontraumatic subarachnoid hemorrhage, unspecified: Secondary | ICD-10-CM | POA: Diagnosis present

## 2021-06-29 DIAGNOSIS — D509 Iron deficiency anemia, unspecified: Secondary | ICD-10-CM | POA: Diagnosis present

## 2021-06-29 DIAGNOSIS — M1990 Unspecified osteoarthritis, unspecified site: Secondary | ICD-10-CM | POA: Diagnosis present

## 2021-06-29 DIAGNOSIS — F02A0 Dementia in other diseases classified elsewhere, mild, without behavioral disturbance, psychotic disturbance, mood disturbance, and anxiety: Secondary | ICD-10-CM | POA: Diagnosis present

## 2021-06-29 DIAGNOSIS — Z886 Allergy status to analgesic agent status: Secondary | ICD-10-CM

## 2021-06-29 DIAGNOSIS — G4733 Obstructive sleep apnea (adult) (pediatric): Secondary | ICD-10-CM | POA: Diagnosis present

## 2021-06-29 DIAGNOSIS — Z79890 Hormone replacement therapy (postmenopausal): Secondary | ICD-10-CM

## 2021-06-29 DIAGNOSIS — Z8673 Personal history of transient ischemic attack (TIA), and cerebral infarction without residual deficits: Secondary | ICD-10-CM

## 2021-06-29 DIAGNOSIS — I619 Nontraumatic intracerebral hemorrhage, unspecified: Secondary | ICD-10-CM | POA: Diagnosis present

## 2021-06-29 LAB — CBC WITH DIFFERENTIAL/PLATELET
Abs Immature Granulocytes: 0.07 10*3/uL (ref 0.00–0.07)
Basophils Absolute: 0 10*3/uL (ref 0.0–0.1)
Basophils Relative: 0 %
Eosinophils Absolute: 0 10*3/uL (ref 0.0–0.5)
Eosinophils Relative: 0 %
HCT: 39.4 % (ref 36.0–46.0)
Hemoglobin: 12.3 g/dL (ref 12.0–15.0)
Immature Granulocytes: 1 %
Lymphocytes Relative: 19 %
Lymphs Abs: 1.7 10*3/uL (ref 0.7–4.0)
MCH: 29.2 pg (ref 26.0–34.0)
MCHC: 31.2 g/dL (ref 30.0–36.0)
MCV: 93.6 fL (ref 80.0–100.0)
Monocytes Absolute: 0.5 10*3/uL (ref 0.1–1.0)
Monocytes Relative: 5 %
Neutro Abs: 6.9 10*3/uL (ref 1.7–7.7)
Neutrophils Relative %: 75 %
Platelets: 127 10*3/uL — ABNORMAL LOW (ref 150–400)
RBC: 4.21 MIL/uL (ref 3.87–5.11)
RDW: 13.2 % (ref 11.5–15.5)
WBC: 9.2 10*3/uL (ref 4.0–10.5)
nRBC: 0 % (ref 0.0–0.2)

## 2021-06-29 LAB — BLOOD GAS, ARTERIAL
Acid-Base Excess: 0.1 mmol/L (ref 0.0–2.0)
Bicarbonate: 24.7 mmol/L (ref 20.0–28.0)
FIO2: 0.4 %
MECHVT: 450 mL
O2 Saturation: 100 %
PEEP: 5 cmH2O
Patient temperature: 37
RATE: 18 resp/min
pCO2 arterial: 39 mmHg (ref 32–48)
pH, Arterial: 7.41 (ref 7.35–7.45)
pO2, Arterial: 115 mmHg — ABNORMAL HIGH (ref 83–108)

## 2021-06-29 LAB — TROPONIN I (HIGH SENSITIVITY): Troponin I (High Sensitivity): 10 ng/L (ref <18)

## 2021-06-29 LAB — BASIC METABOLIC PANEL
Anion gap: 9 (ref 5–15)
BUN: 19 mg/dL (ref 8–23)
CO2: 22 mmol/L (ref 22–32)
Calcium: 8.6 mg/dL — ABNORMAL LOW (ref 8.9–10.3)
Chloride: 106 mmol/L (ref 98–111)
Creatinine, Ser: 0.75 mg/dL (ref 0.44–1.00)
GFR, Estimated: >60 mL/min (ref >60)
Glucose, Bld: 203 mg/dL — ABNORMAL HIGH (ref 70–99)
Potassium: 3.8 mmol/L (ref 3.5–5.1)
Sodium: 137 mmol/L (ref 135–145)

## 2021-06-29 MED ORDER — LORAZEPAM 1 MG PO TABS: 1.0000 mg | ORAL_TABLET | ORAL | Status: DC | PRN

## 2021-06-29 MED ORDER — MORPHINE SULFATE (PF) 2 MG/ML IV SOLN: 1.0000 mg | INTRAVENOUS | Status: DC | PRN

## 2021-06-29 MED ORDER — BISACODYL 10 MG RE SUPP: 10.0000 mg | Freq: Every day | RECTAL | Status: DC | PRN

## 2021-06-29 MED ORDER — ACETAMINOPHEN 325 MG RE SUPP: 650.0000 mg | Freq: Four times a day (QID) | RECTAL | Status: DC | PRN

## 2021-06-29 MED ORDER — ONDANSETRON HCL 4 MG/2ML IJ SOLN: 4.0000 mg | Freq: Four times a day (QID) | INTRAMUSCULAR | Status: DC | PRN

## 2021-06-29 MED ORDER — POLYVINYL ALCOHOL 1.4 % OP SOLN: 1.0000 [drp] | Freq: Four times a day (QID) | OPHTHALMIC | Status: DC | PRN

## 2021-06-29 MED ORDER — LORAZEPAM 2 MG/ML PO CONC: 1.0000 mg | ORAL | Status: DC | PRN

## 2021-06-29 MED ORDER — OXYCODONE HCL 20 MG/ML PO CONC: 5.0000 mg | ORAL | Status: DC | PRN

## 2021-06-29 MED ORDER — MIDAZOLAM HCL 5 MG/5ML IJ SOLN
INTRAMUSCULAR | Status: DC | PRN
  Administered 2021-06-29: 4 mg via INTRAVENOUS
  Administered 2021-06-29: 2 mg via INTRAVENOUS

## 2021-06-29 MED ORDER — LORAZEPAM 2 MG/ML IJ SOLN: 1.0000 mg | INTRAMUSCULAR | Status: DC | PRN

## 2021-06-29 MED ORDER — TRAZODONE HCL 50 MG PO TABS: 25.0000 mg | ORAL_TABLET | Freq: Every evening | ORAL | Status: DC | PRN

## 2021-06-29 MED ORDER — HALOPERIDOL LACTATE 2 MG/ML PO CONC: 0.5000 mg | ORAL | Status: DC | PRN

## 2021-06-29 MED ORDER — MIDAZOLAM-SODIUM CHLORIDE 100-0.9 MG/100ML-% IV SOLN: 0.5000 mg/h | INTRAVENOUS | Status: DC

## 2021-06-29 MED ORDER — DIPHENHYDRAMINE HCL 50 MG/ML IJ SOLN: 12.5000 mg | INTRAMUSCULAR | Status: DC | PRN

## 2021-06-29 MED ORDER — HALOPERIDOL LACTATE 5 MG/ML IJ SOLN: 0.5000 mg | INTRAMUSCULAR | Status: DC | PRN

## 2021-06-29 MED ORDER — GLYCOPYRROLATE 0.2 MG/ML IJ SOLN: 0.2000 mg | INTRAMUSCULAR | Status: DC | PRN

## 2021-06-29 MED ORDER — GLYCOPYRROLATE 1 MG PO TABS: 1.0000 mg | ORAL_TABLET | ORAL | Status: DC | PRN

## 2021-06-29 MED ORDER — ONDANSETRON 4 MG PO TBDP: 4.0000 mg | ORAL_TABLET | Freq: Four times a day (QID) | ORAL | Status: DC | PRN

## 2021-06-29 MED ORDER — ACETAMINOPHEN 325 MG PO TABS: 650.0000 mg | ORAL_TABLET | Freq: Four times a day (QID) | ORAL | Status: DC | PRN

## 2021-06-29 MED ORDER — MIDAZOLAM-SODIUM CHLORIDE 100-0.9 MG/100ML-% IV SOLN
INTRAVENOUS | Status: AC
  Administered 2021-06-29: 4 mg/h via INTRAVENOUS
  Filled 2021-06-29: qty 100

## 2021-06-29 MED ORDER — MIDAZOLAM HCL 2 MG/2ML IJ SOLN
INTRAMUSCULAR | Status: AC
  Filled 2021-06-29: qty 2

## 2021-06-29 MED ORDER — HALOPERIDOL 0.5 MG PO TABS: 0.5000 mg | ORAL_TABLET | ORAL | Status: DC | PRN

## 2021-06-29 MED ORDER — BIOTENE DRY MOUTH MT LIQD
15.0000 mL | OROMUCOSAL | Status: DC | PRN
Start: 2021-06-29 — End: 2021-06-29

## 2021-06-29 MED ORDER — ROCURONIUM BROMIDE 10 MG/ML (PF) SYRINGE
PREFILLED_SYRINGE | INTRAVENOUS | Status: AC
  Filled 2021-06-29: qty 10

## 2021-06-29 MED ORDER — ROCURONIUM BROMIDE 50 MG/5ML IV SOLN
INTRAVENOUS | Status: DC | PRN
  Administered 2021-06-29: 100 mg via INTRAVENOUS

## 2021-07-01 LAB — CBG MONITORING, ED: Glucose-Capillary: 197 mg/dL — ABNORMAL HIGH (ref 70–99)

## 2021-07-10 NOTE — ED Notes (Signed)
Pt in bed, vent beeping "check circuit" circuit appears intact, pt satting 100%, rt called, rt at bedside adjusting vent.  Family remains at bedside

## 2021-07-10 NOTE — Consult Note (Signed)
Neurology Consultation Reason for Consult: Rodriguez Hevia Referring Physician: Wendall Stade  CC: ICH  History is obtained from: Daughter  HPI: Samantha Henry is a 76 y.o. female with a history of mild dementia who was in her normal state of health prior to bed last night.  When her family tried to wake her up this morning, she was not easily arousable, and frothing at the mouth.  They dialed 911 and she was brought in to the emergency department where she was not protecting her airway and was intubated for airway protection.  She was taken for an emergent head CT which demonstrates a massive lobar ICH with midline shift and brainstem compression.    LKW: 6/19 prior to bed  tpa given?: no, ICH ICH Score: 4  ROS: Unable to obtain due to altered mental status.   Past Medical History:  Diagnosis Date   Arthritis    osteo   Cancer ([place]) 1969   CERVICAL CA   COPD (chronic obstructive pulmonary disease) ([place])    Diabetes mellitus without complication ([facility])    Diabetic peripheral neuropathy ([facility])    Environmental and seasonal allergies    GERD (gastroesophageal reflux disease)    History of cervical cancer    History of colon polyps    Hypercholesteremia    Hypertension    Hypothyroidism    Nail dystrophy    Neuropathy    Proteinuria    Restless legs syndrome    Sleep apnea    TIA (transient ischemic attack)    Von Willebrand's disease      Family History  Problem Relation Age of Onset   Alzheimer's disease Mother    Cancer Father    Diabetes Sister    Colon cancer Sister    Diabetes Brother    Cancer Sister 7       melanoma   Breast cancer Maternal Aunt    Hodgkin's lymphoma Sister      Social History:  reports that she quit smoking about 14 years ago. Her smoking use included cigarettes. She has a 72.00 pack-year smoking history. She has never used smokeless tobacco. She reports that she does not drink alcohol and does not use drugs.   Exam: Current vital signs: There  were no vitals filed for this visit.  Vital signs in last 24 hours:     Physical Exam  Constitutional: Appears well-developed and well-nourished.   Neuro: Mental Status: Patient is comatose, does not open eyes or follow commands. Cranial Nerves: II: Pupils are fixed III,IV, VI: No response to doll's eye maneuver V,VII: Corneals are intact X: Cough is intact Motor: Does not respond to noxious stimulation Sensory: Does not respond to noxious stimulation Cerebellar: Does not perform     I have reviewed labs in epic and the results pertinent to this consultation are: Creatinine 0.6  I have reviewed the images obtained: CT head-massive intraparenchymal hemorrhage with extensive edema and brainstem compression.  Impression: 76 year old female who presents with nonsurvivable intraparenchymal hemorrhage.  On arrival she already had coma with brain compression, cerebral edema, midline shift with brain herniation, bradycardia.  This is a nonsurvivable hemorrhage with extensive evidence of brain injury.  I discussed this with the family who state that the patient has expressed wishes in the past of not being kept alive on machines.  They understand that she is not going to survive this.    Recommendations: 1) no aggressive interventions would be likely to improve this patient's outcome, I discussed this with  the family who expressed understanding and would like to make the patient comfort care.  They are expressing a desire to await the patient's son prior to extubating. 2) neurology will be available for any family discussions as needed.  This patient is critically ill and at significant risk of neurological worsening, death and care requires constant monitoring of vital signs, hemodynamics,respiratory and cardiac monitoring, neurological assessment, discussion with family, other specialists and medical decision making of high complexity. I spent 35 minutes of neurocritical care time   in the care of  this patient. This was time spent independent of any time provided by nurse practitioner or PA.  Roland Rack, MD [facility] [phone]  If 7pm- 7am, please page neurology on call as listed in [place]. 2021/07/24  12:47 PM

## 2021-07-10 NOTE — Death Summary Note (Signed)
DEATH SUMMARY   Patient Details  Name: Samantha Henry MRN: [id number] DOB: 1945-11-13 ZDG:[id number], Delman Cheadle Admission/Discharge Information   Admit Date:  07/24/2021  Date of Death: Date of Death: July 24, 2304/20/23  Time of Death: Time of Death: [id number]  Length of Stay: 1   Principle Cause of death: Large right cerebral parenchymal hemorrhage with subarachnoid and subdural involvement.  Extensive mass effect and central herniation with basal cistern effacement.  Physical exam Patient unresponsive to noxious stimuli. Pupils fixed and dilated. No spontaneous respirations.  No auscultation heart sounds.  No auscultated respiration. No auscultated bowel sounds.  No palpable pulse in the right radial or carotid.  Patient pronounced deceased at 65 on 07-24-2021  Hospital Diagnoses: Principal Problem:   End of life care Active Problems:   COPD (chronic obstructive pulmonary disease) (HCC)   Type 2 diabetes mellitus with neurological complications (HCC)   GERD (gastroesophageal reflux disease)   Hypercholesteremia   Hypertension   Hypothyroid   Restless legs syndrome   TIA (transient ischemic attack)   Von Willebrand's disease ([place])   OSA (obstructive sleep apnea)   Status post total knee replacement using cement, left   History of smoking 30 or more pack years   Morbid obesity ([place])   Iron deficiency anemia   Dementia (HCC)   Non-small cell lung cancer (NSCLC) ([place])   Dementia without behavioral disturbance ([place])   Cerebral parenchymal hemorrhage [facility])  Hospital Course:  [name] is a 76 year old female with coronary artery disease, hypertension, hyperlipidemia, von Willebrand not on aspirin, former tobacco user, COPD, history of CVA, mild to moderate dementia, vaginal prolapse, who presented to the emergency department for chief concerns of not arousable state.  Patient was transferred from home via EMS.  Patient lives at home with daughter and her  grandchildren.  Per daughter, she usually wakes at around 7 AM however today it was raining and so daughter let her sleep in.  At approximately 9 AM, grandchild attempted to wake her, per daughter, she was found to be nonresponsive and foaming at the mouth.  In the emergency department, patient was apneic and negative for gag reflex.  She was intubated emergently as family was not at bedside.  EMS reported to ED provider, that family was still working on a CODE STATUS however did not decide at that time.  She was taken emergently to CT for a brain without contrast which was read as intraparenchymal hemorrhage.  Neurology was consulted.  Daughter then presented to bedside and said to EDP that patient has told daughter multiple times that she would not like to be intubated or receive chest compressions.  Daughter was then informed that patient had intraparenchymal hemorrhage and that no aggressive intervention would likely to improve the patient's outcome.  Neurologist discussed this with the family.  Family then elected to have patient transition to comfort care however is requesting that intubation remain in place as her aunt and uncle (patient's sister and brother) are out of town and would like to say goodbye.  Labs were not obtained.  EDP requested hospitalist admission for comfort care.  Assessment and Plan:  * End of life care - Patient presenting with apparent unresponsiveness and was found to have large intraparenchymal hemorrhage with mass effect and herniation, etiology was not worked up due to patient's family requesting comfort - After discussion in the emergency medicine provider, family has decided to proceed with comfort care only - Per EDP and daughter [name]  at bedside, [name] is requesting that we leave intubation tube in place and continue ventilator support so that patient's sister and brother and grandchildren could come say goodbye to her 1 last time - Comfort care order set  utilized - No antibiotics or IVF as per family's request - As needed medications include pain control with morphine as needed, would transition to a drip if needed but patient does not appear to be in pain at this time - RN can pronounce death  Cerebral parenchymal hemorrhage ([place]) - Large right cerebral parenchymal hemorrhage with adjacent subarachnoid and subdural involvement.  Extensive mass effect including significant subfalcine and central herniation with basal cistern effacement.  Trapping of left greater then right lateral ventricles.  Hemorrhage in the posterior fossa likely reflects dorsal brainstem with extension into the fourth ventricle. - Present on admission  Dementia without behavioral disturbance (HCC) - At baseline daughter reports that patient's dementia from short-term memory loss was - Patient is aware of herself, her name, family, daughter, and grandchildren  Procedures: Intubation  Consultations: Neurology  The results of significant diagnostics from this hospitalization (including imaging, microbiology, ancillary and laboratory) are listed below for reference.   Significant Diagnostic Studies:  CT Head Wo Contrast  Result Date: 2021-07-16 CLINICAL DATA:  Mental status change, unknown cause EXAM: CT HEAD WITHOUT CONTRAST TECHNIQUE: Contiguous axial images were obtained from the base of the skull through the vertex without intravenous contrast. RADIATION DOSE REDUCTION: This exam was performed according to the departmental dose-optimization program which includes automated exposure control, adjustment of the mA and/or kV according to patient size and/or use of iterative reconstruction technique. COMPARISON:  None Available. FINDINGS: Brain: Large area of parenchymal hemorrhage is present in right cerebral hemisphere with greatest involvement of the frontal lobe. There is likely some adjacent sulcal subarachnoid hemorrhage. Thin subdural hemorrhage is present along the  cerebral convexity. Posterior fossa hemorrhage involving the dorsal brainstem likely extending into the fourth ventricle. Extensive parenchymal edema is present. There is marked mass effect with subfalcine herniation. Leftward midline shift measures 2.4 cm. There is trapping of the left greater than right lateral ventricles. There is central herniation with effacement of basal cisterns. Vascular: No hyperdense vessel. There is intracranial atherosclerotic calcification at the skull base. Skull: Calvarium is unremarkable. Sinuses/Orbits: No acute finding. Other: None. IMPRESSION: Large right cerebral parenchymal hemorrhage with some adjacent subarachnoid and subdural involvement. Extensive mass effect including significant subfalcine and central herniation with basal cistern effacement. Trapping of left greater than right lateral ventricles. Hemorrhage in the posterior fossa likely reflects the dorsal brainstem with extension into the fourth ventricle. Electronically Signed   By: Macy Mis M.D.   On: Jul 16, 2021 11:59   DG Chest Portable 1 View  Result Date: 07/16/2021 CLINICAL DATA:  Tube placement. EXAM: PORTABLE CHEST 1 VIEW COMPARISON:  None Available. FINDINGS: Endotracheal tube tip in good position 4.2 cm above the carina. Enteric tube entering the stomach with the tip below the field of view. The heart size and mediastinal contours are within normal limits. Normal pulmonary vascularity. Mild bibasilar atelectasis. No focal consolidation, pleural effusion, or pneumothorax. No acute osseous abnormality. IMPRESSION: 1. Appropriately positioned endotracheal and enteric tubes. 2. Mild bibasilar atelectasis. Electronically Signed   By: Titus Dubin M.D.   On: 07-16-2021 11:28    Microbiology: No results found for this or any previous visit (from the past 240 hour(s)).  Time spent: less than 15 minutes minutes  Signed: Dr. Tobie Poet Jul 16, 2021

## 2021-07-10 NOTE — ED Notes (Signed)
All valuables with daughter

## 2021-07-10 NOTE — ED Notes (Signed)
Pt back from ct, rn at RT at bedside.

## 2021-07-10 NOTE — ED Triage Notes (Signed)
LSN 1030 last night. EMS reports pt was unresponsive and family may have done some CPR. Nasal trumpet placed by RT per EDP request.

## 2021-07-10 NOTE — ED Notes (Signed)
CBG 197. 

## 2021-07-10 NOTE — H&P (Signed)
History and Physical   ZYNIA WOJTOWICZ PIR:[id number] DOB: 1945/03/22 DOA: July 04, 2021  PCP: Mikey Kirschner, PA-C  Patient coming from: home via ems  I have personally briefly reviewed patient's old medical records in [place].  Chief Concern: Unresponsiveness  HPI: Ms. Freeda Spivey is a 76 year old female with coronary artery disease, hypertension, hyperlipidemia, von Willebrand not on aspirin, former tobacco user, COPD, history of CVA, mild to moderate dementia, vaginal prolapse, who presented to the emergency department for chief concerns of not arousable state.  Patient was transferred from home via EMS.  Patient lives at home with daughter and her grandchildren.  Per daughter, she usually wakes at around 7 AM however today it was raining and so daughter let her sleep in.  At approximately 9 AM, grandchild attempted to wake her, per daughter, she was found to be nonresponsive and foaming at the mouth.  In the emergency department, patient was apneic and negative for gag reflex.  She was intubated emergently as family was not at bedside.  EMS reported to ED provider, that family was still working on a CODE STATUS however did not decide at that time.  She was taken emergently to CT for a brain without contrast which was read as intraparenchymal hemorrhage.  Neurology was consulted.  Daughter then presented to bedside and said to EDP that patient has told daughter multiple times that she would not like to be intubated or receive chest compressions.  Daughter was then informed that patient had intraparenchymal hemorrhage and that no aggressive intervention would likely to improve the patient's outcome.  Neurologist discussed this with the family.  Family then elected to have patient transition to comfort care however is requesting that intubation remain in place as her aunt and uncle (patient's sister and brother) are out of town and would like to say goodbye.  Labs were not obtained.  EDP  requested hospitalist admission for comfort care.  At bedside patient was intubated, on mechanical ventilation, and nonresponsive to painful stimuli.  Her pupils were bilaterally equal at approximately 3 mm and did not dilated with light.  Per daughter the family had dinner and everything was normal on 06/28/2021.  Patient went to bed like usual.  There was no reported trauma, cough, fever, vomiting.  Social history: She lives at home with her daughter and grandchildren.  ROS: Unable to complete due to intraparenchymal bleed with mass effect and herniation  ED Course: Discussed with emergency medicine provider, patient requiring hospitalization for chief concerns of comfort care/end of life care.  Assessment/Plan  Principal Problem:   End of life care Active Problems:   COPD (chronic obstructive pulmonary disease) (HCC)   Type 2 diabetes mellitus with neurological complications (HCC)   GERD (gastroesophageal reflux disease)   Hypercholesteremia   Hypertension   Hypothyroid   Restless legs syndrome   TIA (transient ischemic attack)   Von Willebrand's disease ([place])   OSA (obstructive sleep apnea)   Status post total knee replacement using cement, left   History of smoking 30 or more pack years   Morbid obesity ([place])   Iron deficiency anemia   Dementia (HCC)   Non-small cell lung cancer (NSCLC) ([place])   Dementia without behavioral disturbance ([place])   Cerebral parenchymal hemorrhage (HCC)   Assessment and Plan:  * End of life care - Patient presenting with apparent unresponsiveness and was found to have large intraparenchymal hemorrhage with mass effect and herniation, etiology was not worked up due to patient's family  requesting comfort - After discussion in the emergency medicine provider, family has decided to proceed with comfort care only - Per EDP and daughter [name] at bedside, [name] is requesting that we leave intubation tube in place and continue ventilator support  so that patient's sister and brother and grandchildren could come say goodbye to her 1 last time - Comfort care order set utilized - No antibiotics or IVF as per family's request - As needed medications include pain control with morphine as needed, would transition to a drip if needed but patient does not appear to be in pain at this time - RN can pronounce death  Cerebral parenchymal hemorrhage ([place]) - Large right cerebral parenchymal hemorrhage with adjacent subarachnoid and subdural involvement.  Extensive mass effect including significant subfalcine and central herniation with basal cistern effacement.  Trapping of left greater then right lateral ventricles.  Hemorrhage in the posterior fossa likely reflects dorsal brainstem with extension into the fourth ventricle. - Present on admission  Dementia without behavioral disturbance ([place]) - At baseline daughter reports that patient's dementia from short-term memory loss was - Patient is aware of herself, her name, family, daughter, and grandchildren  Chart reviewed.   DVT prophylaxis: None Code Status: DNR/DNI Diet: N.p.o. Family Communication: Discussed with daughter, [name] at bedside Disposition Plan: Anticipate in-hospital death Consults called: Neurology Admission status: Admit - It is my clinical opinion that admission to INPATIENT is reasonable and necessary because of the expectation that this patient will require hospital care that crosses at least 2 midnights to treat this condition based on the medical complexity of the problems presented.  Given the aforementioned information, the predictability of an adverse outcome is felt to be significant.  Past Medical History:  Diagnosis Date   Arthritis    osteo   Cancer ([place]) 1969   CERVICAL CA   COPD (chronic obstructive pulmonary disease) ([place])    Diabetes mellitus without complication (HCC)    Diabetic peripheral neuropathy (HCC)    Environmental and seasonal allergies     GERD (gastroesophageal reflux disease)    History of cervical cancer    History of colon polyps    Hypercholesteremia    Hypertension    Hypothyroidism    Nail dystrophy    Neuropathy    Proteinuria    Restless legs syndrome    Sleep apnea    TIA (transient ischemic attack)    Von Willebrand's disease    Past Surgical History:  Procedure Laterality Date   ABDOMINAL HYSTERECTOMY  1969   due to cervical cancer    APPENDECTOMY     BLADDER SURGERY  1997   bladder tacking   BREAST BIOPSY Left 01/21/2015   FIBROADENOMATOUS CHANGE WITH ASSOCIATED MICROCALCIFICATIONS   CARDIAC CATHETERIZATION     CARPAL TUNNEL RELEASE  2010   colonoscopy and upper endoscopy'     COLONOSCOPY WITH PROPOFOL N/A 02/25/2016   Procedure: COLONOSCOPY WITH PROPOFOL;  Surgeon: Lollie Sails, MD;  Location[facility] ENDOSCOPY;  Service: Endoscopy;  Laterality: N/A;   COLONOSCOPY WITH PROPOFOL N/A 03/22/2019   Procedure: COLONOSCOPY WITH PROPOFOL;  Surgeon: Robert Bellow, MD;  Location: [facility] ENDOSCOPY;  Service: Endoscopy;  Laterality: N/A;   ESOPHAGOGASTRODUODENOSCOPY (EGD) WITH PROPOFOL N/A 03/22/2019   Procedure: ESOPHAGOGASTRODUODENOSCOPY (EGD) WITH PROPOFOL;  Surgeon: Robert Bellow, MD;  Location: [facility] ENDOSCOPY;  Service: Endoscopy;  Laterality: N/A;   JOINT REPLACEMENT Left 06/14/2016   KNEE   MUSCLE BIOPSY     OOPHORECTOMY  TENDON RELEASE  2010   right foot, Dr. Vickki Muff    TOTAL KNEE ARTHROPLASTY Left 06/14/2016   Procedure: TOTAL KNEE ARTHROPLASTY;  Surgeon: Corky Mull, MD;  Location: [facility] ORS;  Service: Orthopedics;  Laterality: Left;   Social History:  reports that she quit smoking about 14 years ago. Her smoking use included cigarettes. She has a 72.00 pack-year smoking history. She has never used smokeless tobacco. She reports that she does not drink alcohol and does not use drugs.  Allergies  Allergen Reactions   Aspirin     AVOIDs due to history of Von Willebrands   Gabapentin      dizzy   Lyrica [Pregabalin] Other (See Comments)    Dizziness   Tetracycline Rash   Family History  Problem Relation Age of Onset   Alzheimer's disease Mother    Cancer Father    Diabetes Sister    Colon cancer Sister    Diabetes Brother    Cancer Sister 12       melanoma   Breast cancer Maternal Aunt    Hodgkin's lymphoma Sister    Family history: Family history reviewed and not pertinent.  Prior to Admission medications   Medication Sig Start Date End Date Taking? Authorizing Provider  acetaminophen (TYLENOL) 500 MG tablet Take 500 mg by mouth every 8 (eight) hours as needed.    [provider]  albuterol (VENTOLIN HFA) 108 (90 Base) MCG/ACT inhaler Inhale into the lungs every 6 (six) hours as needed for wheezing or shortness of breath.    [provider]  atorvastatin (LIPITOR) 40 MG tablet Take 1 tablet (40 mg total) by mouth daily. 12/14/20 03/14/21  Kate Sable, MD  azelastine (ASTELIN) 0.1 % nasal spray Place 2 sprays into both nostrils 2 (two) times daily. Use in each nostril as directed 02/18/21   Drubel, Ria Comment, PA-C  benzonatate (TESSALON PERLES) 100 MG capsule Take 1 capsule (100 mg total) by mouth 3 (three) times daily as needed for cough. 02/18/21   Mikey Kirschner, PA-C  calcium carbonate (TUMS EX) 750 MG chewable tablet Chew 2 tablets by mouth 2 (two) times daily as needed for heartburn.    [provider]  donepezil (ARICEPT) 10 MG tablet TAKE 1 TABLET BY MOUTH AT BEDTIME 01/18/21   Drubel, Ria Comment, PA-C  fluticasone (FLONASE) 50 MCG/ACT nasal spray Place 2 sprays into both nostrils daily. 05/08/19   Virginia Crews, MD  levothyroxine (EUTHYROX) 125 MCG tablet Take 1 tablet (125 mcg total) by mouth daily. 09/16/20   Birdie Sons, MD  losartan (COZAAR) 25 MG tablet Take 2 tablets by mouth once daily 06/16/21   Drubel, Ria Comment, PA-C  meloxicam [facility]) 7.5 MG tablet Take 1 tablet by mouth once daily 03/30/21   Drubel, Ria Comment, PA-C   OXYGEN Inhale 3 L into the lungs. Used at night and PRN during the day    [provider]  pantoprazole (PROTONIX) 40 MG tablet Take 1 tablet by mouth once daily 09/16/20   Birdie Sons, MD  Peppermint Oil (IBGARD PO) Take by mouth as needed.    [provider]  Polyethylene Glycol 3350 (MIRALAX PO) Take by mouth as needed.    [provider]  potassium chloride SA (KLOR-CON) 20 MEQ tablet Take 1 tablet by mouth once daily 09/28/20   Birdie Sons, MD  TRELEGY ELLIPTA 100-62.5-25 MCG/INH AEPB INHALE 1 PUFF ONCE DAILY 07/28/20   Birdie Sons, MD   Physical Exam: Vitals:  07/20/2021 1300 07/20/21 1310 20-Jul-2021 1330 2021/07/20 1400  BP: (!) 95/44 (!) 94/45 (!) 98/42 116/61  Pulse: (!) 51 (!) 50 (!) 55 74  Resp: 20 20 20  (!) 25  SpO2: 99% 99% 98% 100%   Constitutional: appears ill, unresponsive, Eyes: Bilateral pupils were equal at approximately 3 mm and nonresponsive to light or accommodation, lids and conjunctivae normal.  Patient did not spontaneously open her eyes. ENMT: Mucous membranes are dry.  Unable to assess posterior pharynx as patient is intubated.  Unable to assess dentition. Unable to assess hearing Neck: normal, supple, no masses, no thyromegaly Respiratory: clear to auscultation bilaterally, no wheezing, no crackles. Normal respiratory effort. No accessory muscle use.  Cardiovascular: Regular rate and rhythm, no murmurs / rubs / gallops. No extremity edema. 2+ pedal pulses. No carotid bruits.  Abdomen: Obese abdomen, no tenderness, no masses palpated, no hepatosplenomegaly.  Negative for bowel sounds. Musculoskeletal: no clubbing / cyanosis. No joint deformity upper and lower extremities. Good ROM, no contractures, no atrophy. Normal muscle tone.  Skin: no rashes, lesions, ulcers. No induration Neurologic: Sensation not intact. Strength 0/5 in all 4.  Did not respond to painful stimuli. Psychiatric: Unable to assess judgment and insight, mood.   Patient was not awake alert and oriented.  EKG: independently reviewed, showing sinus tachycardia with rate of 133, 518  Chest x-ray on Admission: I personally reviewed and I agree with radiologist reading as below.  CT Head Wo Contrast  Result Date: 20-Jul-2021 CLINICAL DATA:  Mental status change, unknown cause EXAM: CT HEAD WITHOUT CONTRAST TECHNIQUE: Contiguous axial images were obtained from the base of the skull through the vertex without intravenous contrast. RADIATION DOSE REDUCTION: This exam was performed according to the departmental dose-optimization program which includes automated exposure control, adjustment of the mA and/or kV according to patient size and/or use of iterative reconstruction technique. COMPARISON:  None Available. FINDINGS: Brain: Large area of parenchymal hemorrhage is present in right cerebral hemisphere with greatest involvement of the frontal lobe. There is likely some adjacent sulcal subarachnoid hemorrhage. Thin subdural hemorrhage is present along the cerebral convexity. Posterior fossa hemorrhage involving the dorsal brainstem likely extending into the fourth ventricle. Extensive parenchymal edema is present. There is marked mass effect with subfalcine herniation. Leftward midline shift measures 2.4 cm. There is trapping of the left greater than right lateral ventricles. There is central herniation with effacement of basal cisterns. Vascular: No hyperdense vessel. There is intracranial atherosclerotic calcification at the skull base. Skull: Calvarium is unremarkable. Sinuses/Orbits: No acute finding. Other: None. IMPRESSION: Large right cerebral parenchymal hemorrhage with some adjacent subarachnoid and subdural involvement. Extensive mass effect including significant subfalcine and central herniation with basal cistern effacement. Trapping of left greater than right lateral ventricles. Hemorrhage in the posterior fossa likely reflects the dorsal brainstem with  extension into the fourth ventricle. Electronically Signed   By: Macy Mis M.D.   On: 07-20-2021 11:59   DG Chest Portable 1 View  Result Date: 07/20/21 CLINICAL DATA:  Tube placement. EXAM: PORTABLE CHEST 1 VIEW COMPARISON:  None Available. FINDINGS: Endotracheal tube tip in good position 4.2 cm above the carina. Enteric tube entering the stomach with the tip below the field of view. The heart size and mediastinal contours are within normal limits. Normal pulmonary vascularity. Mild bibasilar atelectasis. No focal consolidation, pleural effusion, or pneumothorax. No acute osseous abnormality. IMPRESSION: 1. Appropriately positioned endotracheal and enteric tubes. 2. Mild bibasilar atelectasis. Electronically Signed   By: Orville Govern.D.  On: 2021-07-17 11:28    Labs on Admission: I have personally reviewed following labs  Urine analysis:    Component Value Date/Time   COLORURINE YELLOW (A) 06/01/2016 1026   APPEARANCEUR CLOUDY (A) 06/01/2016 1026   LABSPEC 1.023 06/01/2016 1026   PHURINE 6.0 06/01/2016 1026   GLUCOSEU NEGATIVE 06/01/2016 1026   HGBUR NEGATIVE 06/01/2016 1026   BILIRUBINUR negative 01/12/2021 1444   KETONESUR 5 (A) 06/01/2016 1026   PROTEINUR Negative 01/12/2021 1444   PROTEINUR 30 (A) 06/01/2016 1026   UROBILINOGEN 0.2 01/12/2021 1444   NITRITE negative 01/12/2021 1444   NITRITE NEGATIVE 06/01/2016 1026   LEUKOCYTESUR Moderate (2+) (A) 01/12/2021 1444   Dr. Tobie Poet [facility]  If 7PM-7AM, please contact overnight-coverage provider If 7AM-7PM, please contact day coverage provider [email or web address]  2021-07-17, 10:56 PM

## 2021-07-10 NOTE — Assessment & Plan Note (Signed)
-   Large right cerebral parenchymal hemorrhage with adjacent subarachnoid and subdural involvement.  Extensive mass effect including significant subfalcine and central herniation with basal cistern effacement.  Trapping of left greater then right lateral ventricles.  Hemorrhage in the posterior fossa likely reflects dorsal brainstem with extension into the fourth ventricle. - Present on admission

## 2021-07-10 NOTE — ED Notes (Addendum)
Pt in bed, RT at bedside, family at bedside, states that they are ready to extubate pt.  Pt extubated, also d/c OG tube, pt placed on 3L O2 via [place]. Stopped versed gtt.  Family at bedside, pt beginning to [place], md notified

## 2021-07-10 NOTE — Hospital Course (Addendum)
[name] is a 76 year old female with coronary artery disease, hypertension, hyperlipidemia, von Willebrand not on aspirin, former tobacco user, COPD, history of CVA, mild to moderate dementia, vaginal prolapse, who presented to the emergency department for chief concerns of not arousable state.  Patient was transferred from home via EMS.  Patient lives at home with daughter and her grandchildren.  Per daughter, she usually wakes at around 7 AM however today it was raining and so daughter let her sleep in.  At approximately 9 AM, grandchild attempted to wake her, per daughter, she was found to be nonresponsive and foaming at the mouth.  In the emergency department, patient was apneic and negative for gag reflex.  She was intubated emergently as family was not at bedside.  EMS reported to ED provider, that family was still working on a CODE STATUS however did not decide at that time.  She was taken emergently to CT for a brain without contrast which was read as intraparenchymal hemorrhage.  Neurology was consulted.  Daughter then presented to bedside and said to EDP that patient has told daughter multiple times that she would not like to be intubated or receive chest compressions.  Daughter was then informed that patient had intraparenchymal hemorrhage and that no aggressive intervention would likely to improve the patient's outcome.  Neurologist discussed this with the family.  Family then elected to have patient transition to comfort care however is requesting that intubation remain in place as her aunt and uncle (patient's sister and brother) are out of town and would like to say goodbye.  Labs were not obtained.  EDP requested hospitalist admission for comfort care.

## 2021-07-10 NOTE — Assessment & Plan Note (Signed)
-   At baseline daughter reports that patient's dementia from short-term memory loss was - Patient is aware of herself, her name, family, daughter, and grandchildren

## 2021-07-10 NOTE — ED Notes (Addendum)
Pt pronounced by DO Cox at 1436, pt asystole on the monitor, no pulse or heartbeat per DO

## 2021-07-10 NOTE — ED Notes (Signed)
Hospitalist at bedside, family would like to extubate pt.  RT called.

## 2021-07-10 NOTE — Assessment & Plan Note (Signed)
-   Patient presenting with apparent unresponsiveness and was found to have large intraparenchymal hemorrhage with mass effect and herniation, etiology was not worked up due to patient's family requesting comfort - After discussion in the emergency medicine provider, family has decided to proceed with comfort care only - Per EDP and daughter [name] at bedside, [name] is requesting that we leave intubation tube in place and continue ventilator support so that patient's sister and brother and grandchildren could come say goodbye to her 1 last time - Comfort care order set utilized - No antibiotics or IVF as per family's request - As needed medications include pain control with morphine as needed, would transition to a drip if needed but patient does not appear to be in pain at this time - RN can pronounce death

## 2021-07-10 NOTE — Plan of Care (Signed)
Patient is extubated to 3 lpm O2 nasal canula

## 2021-07-10 NOTE — ED Provider Notes (Signed)
[facility] Provider Note    Event Date/Time   First MD Initiated Contact with Patient 05-Jul-2021 1256     (approximate)   History   No chief complaint on file.   HPI  Samantha Henry is a 76 y.o. female  This note has been putting in under a different patient.  The different patient's chart is Cassandre Oleksy [id number].  The 2 charts are supposed to be merged.   Physical Exam   Triage Vital Signs: ED Triage Vitals [07-05-2021 1220]  Enc Vitals Group     BP (!) 117/50     Pulse Rate (!) 47     Resp 20     Temp      Temp src      SpO2 100 %     Weight      Height      Head Circumference      Peak Flow      Pain Score      Pain Loc      Pain Edu?      Excl. in [place]?     Most recent vital signs: Vitals:   05-Jul-2021 1330 07/05/21 1400  BP: (!) 98/42 116/61  Pulse: (!) 55 74  Resp: 20 (!) 25  SpO2: 98% 100%    See Benjamine Sprague chart [id number]  ED Results / Procedures / Treatments   Labs (all labs ordered are listed, but only abnormal results are displayed) Labs Reviewed - No data to display   EKG     RADIOLOGY   PROCEDURES:  Critical Care performed:   Procedures   MEDICATIONS ORDERED IN ED: Medications  acetaminophen (TYLENOL) tablet 650 mg (has no administration in time range)    Or  acetaminophen (TYLENOL) suppository 650 mg (has no administration in time range)  haloperidol (HALDOL) tablet 0.5 mg (has no administration in time range)    Or  haloperidol (HALDOL) 2 MG/ML solution 0.5 mg (has no administration in time range)    Or  haloperidol lactate (HALDOL) injection 0.5 mg (has no administration in time range)  ondansetron (ZOFRAN-ODT) disintegrating tablet 4 mg (has no administration in time range)    Or  ondansetron (ZOFRAN) injection 4 mg (has no administration in time range)  glycopyrrolate (ROBINUL) tablet 1 mg (has no administration in time range)    Or  glycopyrrolate (ROBINUL) injection 0.2 mg  (has no administration in time range)    Or  glycopyrrolate (ROBINUL) injection 0.2 mg (has no administration in time range)  antiseptic oral rinse (BIOTENE) solution 15 mL (has no administration in time range)  polyvinyl alcohol (LIQUIFILM TEARS) 1.4 % ophthalmic solution 1 drop (has no administration in time range)  oxyCODONE (ROXICODONE INTENSOL) 20 MG/ML concentrated solution 5 mg (has no administration in time range)    Or  oxyCODONE (ROXICODONE INTENSOL) 20 MG/ML concentrated solution 5 mg (has no administration in time range)  morphine (PF) 2 MG/ML injection 1 mg (has no administration in time range)  traZODone (DESYREL) tablet 25 mg (has no administration in time range)  LORazepam (ATIVAN) tablet 1 mg (has no administration in time range)    Or  LORazepam (ATIVAN) 2 MG/ML concentrated solution 1 mg (has no administration in time range)    Or  LORazepam (ATIVAN) injection 1 mg (has no administration in time range)  diphenhydrAMINE (BENADRYL) injection 12.5 mg (has no administration in time range)  bisacodyl (DULCOLAX) suppository 10 mg (has no administration in time range)  LORazepam (ATIVAN) injection 1 mg (has no administration in time range)     IMPRESSION / MDM / ASSESSMENT AND PLAN / ED COURSE  See Benjamine Sprague chart [id number] {  FINAL CLINICAL IMPRESSION(S) / ED DIAGNOSES   Final diagnoses:  Hemorrhagic stroke [facility])     Rx / DC Orders   ED Discharge Orders     None        Note:  This document was prepared using Dragon voice recognition software and may include unintentional dictation errors.   Nena Polio, MD 07-28-21 1515

## 2021-07-10 NOTE — ED Triage Notes (Signed)
Pt in bed, family at bedside.

## 2021-07-10 NOTE — Progress Notes (Signed)
  Chaplain On-Call responded to a call from [place] in the ED, who reported the need for a Chaplain to support the family of a patient in room 3.  Chaplain received medical updates from Physicians and Nurses about the severity of the brain bleed for the patient, and the likelihood that she will not survive. Chaplain met patient's daughter [name] and granddaughter Elmo Putt at bedside. Provided spiritual and emotional support and prayer for them, as they described the suddenness of this condition for the patient.  Chaplain will inform the Afternoon Chaplain for follow up support if requested.  Chaplain Pollyann Samples M.Div., [facility]

## 2021-07-10 NOTE — ED Provider Notes (Signed)
[facility] Provider Note    Event Date/Time   First MD Initiated Contact with Patient 2021-07-17 1119     (approximate)   History   No chief complaint on file.   HPI  Justine Dines is a 76 y.o. female patient was last known well at 1030 last night.  Apparently comes from home.  Patient was unresponsive and EMS was called and family may have done some CPR.  Here patient is breathing possibly every 20 seconds or so and has no gag reflex.  Pupils are not responsive.  Patient is occasionally coughing and moving her shoulders.  Because she has no gag reflex and is breathing very slowly patient was given 2 of Versed and 100 of rocuronium and intubated with a 7-1/2 ET tube under direct vision.  I saw the balloon go through the cords.  Patient had good bilateral breath sounds and none on the stomach good color change on the colorimetric detector.      Physical Exam   Triage Vital Signs: ED Triage Vitals [2021/07/17 1100]  Enc Vitals Group     BP (!) 187/71     Pulse Rate (!) 51     Resp (!) 22     Temp      Temp src      SpO2 100 %     Weight      Height      Head Circumference      Peak Flow      Pain Score      Pain Loc      Pain Edu?      Excl. in [place]?     Most recent vital signs: Vitals:   July 17, 2021 1140 07-17-2021 1150  BP: (!) 156/60 (!) 128/50  Pulse: 89 (!) 51  Resp: 19 20  SpO2: 100% 100%     General: Unresponsive as described in HPI CV:  Good peripheral perfusion.  Heart regular rate and rhythm no audible murmurs Resp:  Normal effort.  Lungs are clear but again very slow respirations Abd:  No distention.  Soft no apparent tenderness Extremities with no apparent injury possibly trace edema   ED Results / Procedures / Treatments   Labs (all labs ordered are listed, but only abnormal results are displayed) Labs Reviewed  BASIC METABOLIC PANEL - Abnormal; Notable for the following components:      Result Value   Glucose, Bld 203 (*)     Calcium 8.6 (*)    All other components within normal limits  BLOOD GAS, ARTERIAL - Abnormal; Notable for the following components:   pO2, Arterial 115 (*)    All other components within normal limits  CBC WITH DIFFERENTIAL/PLATELET - Abnormal; Notable for the following components:   Platelets 127 (*)    All other components within normal limits  CBC WITH DIFFERENTIAL/PLATELET  TROPONIN I (HIGH SENSITIVITY)  TROPONIN I (HIGH SENSITIVITY)     EKG EKG #1 sinus rhythm at about 90 there is some occasional pauses.  Normal axis no acute ST-T changes EKG #2 sinus tachycardia at 133 normal axis no acute ST-T changes this is after intubation. Heart rate goes down to 106 after more Versed.  RADIOLOGY CT head shows large right-sided bleed with surrounding edema.  Radiologist has not read the film at this point.  PROCEDURES:  Critical Care performed: Critical care time 30 minutes.  This includes evaluating the patient intubating the patient, reviewing the CT scan discussing the CT with neurology  and neurosurgery.  I also then spoke with the family and because the family had some members out of town I had to speak with the hospitalist about admitting the patient to the family members to try to get in.  Procedures   MEDICATIONS ORDERED IN ED: Medications  midazolam (VERSED) 5 MG/5ML injection ( Intravenous Canceled Entry Jul 08, 2021 1115)  rocuronium (ZEMURON) injection ( Intravenous Canceled Entry 2021/07/08 1115)  midazolam (VERSED) 100 mg/100 mL (1 mg/mL) premix infusion (0 mg/hr Intravenous Stopped 07-08-2021 1130)     IMPRESSION / MDM / ASSESSMENT AND PLAN / ED COURSE  I reviewed the triage vital signs and the nursing notes. Neurosurgery and neurology both agree patient has an unsurvivable severe hemorrhagic stroke with midline shift.  Family is aware.  They have made her DNR.  EMS did tell me initially that the paperwork and not been completed so I reintubated the patient but family then told  me that they did not want her resuscitated which is perfectly understandable.  Patient did have dementia as well.   Patient's presentation is most consistent with acute presentation with potential threat to life or bodily function.  The patient is on the cardiac monitor to evaluate for evidence of arrhythmia and/or significant heart rate changes.  None were seen.  Patient did become somewhat tachycardic and hypertensive briefly until we could get more Versed into her and then she resumed having Close to normal heart rate and blood pressure.  Patient was admitted to Dr. Tobie Poet and then expired.  Dr. Tobie Poet pronounced the patient. FINAL CLINICAL IMPRESSION(S) / ED DIAGNOSES   Final diagnoses:  Intracranial hemorrhage ([place])     Rx / DC Orders   ED Discharge Orders     None        Note:  This document was prepared using Dragon voice recognition software and may include unintentional dictation errors.   Nena Polio, MD 2021-07-08 [phone]

## 2021-07-10 DEATH — deceased

## 2021-08-18 ENCOUNTER — Encounter: Payer: Medicare Other | Admitting: Physician Assistant

## 2022-01-24 ENCOUNTER — Ambulatory Visit: Payer: Medicare Other

## 2022-02-03 ENCOUNTER — Ambulatory Visit: Payer: Medicare Other | Admitting: Radiation Oncology

## 2022-02-18 ENCOUNTER — Other Ambulatory Visit: Payer: Medicare Other

## 2022-06-28 ENCOUNTER — Ambulatory Visit: Payer: Self-pay

## 2022-06-28 ENCOUNTER — Telehealth: Payer: Self-pay | Admitting: Physician Assistant

## 2022-06-28 NOTE — Telephone Encounter (Signed)
[facility] called and spoke with Revonda Standard, [facility] who states that were some forms that were orginally faxed on 06/16/22 and suppose to be sent to main office, advised her since I wasn't in office and deal with faxes was going to transfer to someone who could assist better, called FC and got Tok, CMA and was able to transfer Revonda Standard to her. No further assistance noted.   Summary: Pharmacy need clarification on Rx   Heather from [facility] is requesting a callback from a nurse regarding the patient's medication. She stated she needs clarification on some medications. She stated she is aware pt is deceased; however, they still need this clarification for Rx that were prescribed before pt passed away.  Please ask for Herbert Seta when you callback.

## 2022-07-04 ENCOUNTER — Telehealth: Payer: Self-pay | Admitting: Physician Assistant

## 2022-07-04 NOTE — Telephone Encounter (Signed)
Heather from  [facility] [address]), [place] - [address] Phone: [phone]  Fax: [phone]     Asked to speak to [facility] directly
